# Patient Record
Sex: Male | Born: 1950
Health system: Southern US, Community
[De-identification: ages and names within clinical notes are randomized; demographics above are authoritative.]

## PROBLEM LIST (undated history)

## (undated) DIAGNOSIS — N281 Cyst of kidney, acquired: Secondary | ICD-10-CM

## (undated) DIAGNOSIS — I10 Essential (primary) hypertension: Secondary | ICD-10-CM

## (undated) DIAGNOSIS — E785 Hyperlipidemia, unspecified: Secondary | ICD-10-CM

## (undated) HISTORY — DX: Essential (primary) hypertension: I10

## (undated) HISTORY — DX: Hyperlipidemia, unspecified: E78.5

## (undated) HISTORY — PX: NASAL SINUS SURGERY: SHX719

## (undated) HISTORY — DX: Cyst of kidney, acquired: N28.1

---

## 2005-03-10 ENCOUNTER — Ambulatory Visit (HOSPITAL_COMMUNITY): Admission: RE | Admit: 2005-03-10 | Discharge: 2005-03-10 | Payer: Self-pay | Admitting: Internal Medicine

## 2005-03-10 ENCOUNTER — Ambulatory Visit: Payer: Self-pay | Admitting: Internal Medicine

## 2005-03-10 ENCOUNTER — Encounter (INDEPENDENT_AMBULATORY_CARE_PROVIDER_SITE_OTHER): Payer: Self-pay | Admitting: Internal Medicine

## 2006-05-23 ENCOUNTER — Emergency Department (HOSPITAL_COMMUNITY): Admission: EM | Admit: 2006-05-23 | Discharge: 2006-05-23 | Payer: Self-pay | Admitting: Emergency Medicine

## 2007-05-29 ENCOUNTER — Emergency Department (HOSPITAL_COMMUNITY): Admission: EM | Admit: 2007-05-29 | Discharge: 2007-05-29 | Payer: Self-pay | Admitting: Emergency Medicine

## 2008-08-11 ENCOUNTER — Emergency Department (HOSPITAL_COMMUNITY): Admission: EM | Admit: 2008-08-11 | Discharge: 2008-08-11 | Payer: Self-pay | Admitting: Emergency Medicine

## 2009-03-05 ENCOUNTER — Encounter (INDEPENDENT_AMBULATORY_CARE_PROVIDER_SITE_OTHER): Payer: Self-pay | Admitting: *Deleted

## 2009-03-05 ENCOUNTER — Encounter (INDEPENDENT_AMBULATORY_CARE_PROVIDER_SITE_OTHER): Payer: Self-pay

## 2009-03-05 LAB — CONVERTED CEMR LAB
AST: 17 units/L
Albumin: 4.3 g/dL
Albumin: 4.3 g/dL
BUN: 18 mg/dL
BUN: 18 mg/dL
CO2: 25 meq/L
Calcium: 9.4 mg/dL
Chloride: 103 meq/L
Creatinine, Ser: 1.24 mg/dL
Glomerular Filtration Rate, Af Am: >60 mL/min/{1.73_m2}
Glucose, Bld: 108 mg/dL
Glucose, Bld: 108 mg/dL
Hgb A1c MFr Bld: 7.3 %
LDL Cholesterol: 125 mg/dL
Potassium: 4.2 meq/L
Total Protein: 6.9 g/dL
Triglycerides: 81 mg/dL

## 2009-03-14 ENCOUNTER — Encounter (INDEPENDENT_AMBULATORY_CARE_PROVIDER_SITE_OTHER): Payer: Self-pay

## 2009-03-14 ENCOUNTER — Ambulatory Visit: Payer: Self-pay | Admitting: Adult Health

## 2009-03-14 DIAGNOSIS — E785 Hyperlipidemia, unspecified: Secondary | ICD-10-CM | POA: Insufficient documentation

## 2009-03-14 DIAGNOSIS — E119 Type 2 diabetes mellitus without complications: Secondary | ICD-10-CM

## 2009-03-14 DIAGNOSIS — I10 Essential (primary) hypertension: Secondary | ICD-10-CM | POA: Insufficient documentation

## 2009-03-14 DIAGNOSIS — E669 Obesity, unspecified: Secondary | ICD-10-CM

## 2009-03-29 ENCOUNTER — Encounter: Payer: Self-pay | Admitting: Cardiology

## 2009-03-29 ENCOUNTER — Ambulatory Visit (HOSPITAL_COMMUNITY): Admission: RE | Admit: 2009-03-29 | Discharge: 2009-03-29 | Payer: Self-pay | Admitting: Cardiology

## 2009-03-29 ENCOUNTER — Ambulatory Visit: Payer: Self-pay | Admitting: Cardiology

## 2009-04-02 ENCOUNTER — Encounter: Payer: Self-pay | Admitting: Cardiology

## 2009-04-09 ENCOUNTER — Encounter (INDEPENDENT_AMBULATORY_CARE_PROVIDER_SITE_OTHER): Payer: Self-pay | Admitting: *Deleted

## 2009-04-12 ENCOUNTER — Ambulatory Visit: Payer: Self-pay | Admitting: Cardiology

## 2009-09-10 ENCOUNTER — Encounter (INDEPENDENT_AMBULATORY_CARE_PROVIDER_SITE_OTHER): Payer: Self-pay | Admitting: *Deleted

## 2010-01-29 ENCOUNTER — Encounter (INDEPENDENT_AMBULATORY_CARE_PROVIDER_SITE_OTHER): Payer: Self-pay | Admitting: *Deleted

## 2010-02-13 ENCOUNTER — Emergency Department (HOSPITAL_COMMUNITY): Admission: EM | Admit: 2010-02-13 | Discharge: 2010-02-13 | Payer: Self-pay | Admitting: Emergency Medicine

## 2010-02-19 ENCOUNTER — Emergency Department (HOSPITAL_COMMUNITY)
Admission: EM | Admit: 2010-02-19 | Discharge: 2010-02-19 | Payer: Self-pay | Source: Home / Self Care | Admitting: Emergency Medicine

## 2010-02-19 ENCOUNTER — Emergency Department (HOSPITAL_COMMUNITY): Admission: EM | Admit: 2010-02-19 | Discharge: 2010-02-19 | Payer: Self-pay | Admitting: Emergency Medicine

## 2010-02-27 ENCOUNTER — Ambulatory Visit (HOSPITAL_COMMUNITY): Admission: RE | Admit: 2010-02-27 | Discharge: 2010-02-28 | Payer: Self-pay | Admitting: Otolaryngology

## 2010-04-03 ENCOUNTER — Emergency Department (HOSPITAL_COMMUNITY): Admission: EM | Admit: 2010-04-03 | Discharge: 2010-02-21 | Payer: Self-pay | Admitting: Emergency Medicine

## 2010-05-27 NOTE — Letter (Signed)
Summary: Appointment - Reminder 2  [facility]. [address], [place] [address]   Phone: [phone]  Fax: [phone]     January 29, 2010 MRN: [id number]   [name] [name] [address] [place], [place]  [address]   Dear [name] [name],  Our records indicate that it is time to schedule a follow-up appointment.  KATHRYN LAWRENCE          recommended that you follow up with [place] in   3/11         . It is very important that we reach you to schedule this appointment. We look forward to participating in your health care needs. Please contact us at the number listed above at your earliest convenience to schedule your appointment.  If you are unable to make an appointment at this time, give [place] a call so we can update our records.     Sincerely,   Glass blower/designer

## 2010-05-27 NOTE — Letter (Signed)
Summary: Appointment - Reminder 2  [facility]. [address], [place] [address]   Phone: [phone]  Fax: [phone]     Sep 10, 2009 MRN: [id number]   [name] [name] [address] [place], [place]  [address]   Dear [name],  Our records indicate that it is time to schedule a follow-up appointment.  Joni Reining NP        recommended that you follow up with [place] in   06/2009         . It is very important that we reach you to schedule this appointment. We look forward to participating in your health care needs. Please contact us at the number listed above at your earliest convenience to schedule your appointment.  If you are unable to make an appointment at this time, give [place] a call so we can update our records.     Sincerely,   Glass blower/designer

## 2010-07-08 LAB — CBC
HCT: 41.8 % (ref 39.0–52.0)
Hemoglobin: 13.9 g/dL (ref 13.0–17.0)
MCH: 27.6 pg (ref 26.0–34.0)
MCHC: 33.3 g/dL (ref 30.0–36.0)
MCV: 83.1 fL (ref 78.0–100.0)
Platelets: 221 K/uL (ref 150–400)
RBC: 5.03 MIL/uL (ref 4.22–5.81)
RDW: 13.6 % (ref 11.5–15.5)
WBC: 4.9 10*3/uL (ref 4.0–10.5)

## 2010-07-08 LAB — URINALYSIS, ROUTINE W REFLEX MICROSCOPIC
Bilirubin Urine: NEGATIVE
Glucose, UA: 100 mg/dL — AB
Hgb urine dipstick: NEGATIVE
Ketones, ur: NEGATIVE mg/dL
Nitrite: NEGATIVE
Protein, ur: NEGATIVE mg/dL
Specific Gravity, Urine: 1.006 (ref 1.005–1.030)
Urobilinogen, UA: 0.2 mg/dL (ref 0.0–1.0)
pH: 6 (ref 5.0–8.0)

## 2010-07-08 LAB — GLUCOSE, CAPILLARY
Glucose-Capillary: 140 mg/dL — ABNORMAL HIGH (ref 70–99)
Glucose-Capillary: 149 mg/dL — ABNORMAL HIGH (ref 70–99)
Glucose-Capillary: 166 mg/dL — ABNORMAL HIGH (ref 70–99)
Glucose-Capillary: 168 mg/dL — ABNORMAL HIGH (ref 70–99)
Glucose-Capillary: 169 mg/dL — ABNORMAL HIGH (ref 70–99)
Glucose-Capillary: 270 mg/dL — ABNORMAL HIGH (ref 70–99)

## 2010-07-08 LAB — BASIC METABOLIC PANEL WITH GFR
BUN: 13 mg/dL (ref 6–23)
Chloride: 102 meq/L (ref 96–112)
Creatinine, Ser: 1.2 mg/dL (ref 0.4–1.5)
Glucose, Bld: 145 mg/dL — ABNORMAL HIGH (ref 70–99)
Potassium: 4.3 meq/L (ref 3.5–5.1)

## 2010-07-08 LAB — BASIC METABOLIC PANEL
CO2: 30 mEq/L (ref 19–32)
Calcium: 9.6 mg/dL (ref 8.4–10.5)
GFR calc Af Amer: >60 mL/min (ref >60)
GFR calc non Af Amer: >60 mL/min (ref >60)
Sodium: 138 mEq/L (ref 135–145)

## 2010-07-08 LAB — SURGICAL PCR SCREEN
MRSA, PCR: POSITIVE — AB
Staphylococcus aureus: POSITIVE — AB

## 2010-07-09 LAB — GLUCOSE, CAPILLARY: Glucose-Capillary: 114 mg/dL — ABNORMAL HIGH (ref 70–99)

## 2010-09-12 NOTE — Op Note (Signed)
NAME:  [name] [name]                 ACCOUNT NO.:  [id number]   MEDICAL RECORD NO.:  [id number]          PATIENT TYPE:  AMB   LOCATION:  DAY                           FACILITY:  [facility]   PHYSICIAN:  Lionel December, M.D.    DATE OF BIRTH:  [birth year]   DATE OF PROCEDURE:  03/10/2005  DATE OF DISCHARGE:                                 OPERATIVE REPORT   PROCEDURE:  Colonoscopy with polypectomy.   INDICATIONS:  [name] [name] is a 60 year old African-American male who is here for  screening colonoscopy. Family history is negative for colorectal carcinoma.   Procedure risks were reviewed with the patient, and informed consent was  obtained.   MEDICATIONS USED FOR CONSCIOUS SEDATION:  Demerol 50 mg IV, Versed 6 mg IV  in divided dose.   FINDINGS:  Procedure performed in endoscopy suite. The patient's vital signs  and O2 saturation were monitored during the procedure and remained stable.  The patient was placed in left lateral position, and rectal examination  performed. No abnormality noted on external or digital exam. Olympus  videoscope was placed in rectum and advanced under vision into sigmoid colon  and beyond. Preparation was satisfactory. He had a few tiny diverticula at  sigmoid colon. Scope was advanced to cecum which was identified by ileocecal  valve and appendiceal orifice. There was a tiny polyp at cecum which was  coagulated using snare tip. There was 7 to 8 mm polyp across ileocecal  valve, which this was snared and retrieved for histological examination. As  the scope was withdrawn, colonic mucosa was carefully examined. There was an  18 to 20 mm polyp at mid sigmoid colon. This was on a short stalk. This was  snared and retrieved for histological examination. There was   Dictation ended at this point.      Lionel December, M.D.  Electronically Signed     NR/MEDQ  D:  03/10/2005  T:  03/10/2005  Job:  [id number]

## 2010-12-26 ENCOUNTER — Encounter: Payer: Self-pay | Admitting: Family Medicine

## 2010-12-26 ENCOUNTER — Ambulatory Visit (INDEPENDENT_AMBULATORY_CARE_PROVIDER_SITE_OTHER): Payer: 59 | Admitting: Family Medicine

## 2010-12-26 VITALS — BP 160/90 | HR 87 | Resp 16 | Ht 72.6 in | Wt 294.1 lb

## 2010-12-26 DIAGNOSIS — E785 Hyperlipidemia, unspecified: Secondary | ICD-10-CM

## 2010-12-26 DIAGNOSIS — E119 Type 2 diabetes mellitus without complications: Secondary | ICD-10-CM

## 2010-12-26 DIAGNOSIS — I10 Essential (primary) hypertension: Secondary | ICD-10-CM

## 2010-12-26 DIAGNOSIS — E669 Obesity, unspecified: Secondary | ICD-10-CM

## 2010-12-26 MED ORDER — LISINOPRIL-HYDROCHLOROTHIAZIDE 20-25 MG PO TABS: 1.0000 | ORAL_TABLET | Freq: Every day | ORAL | Status: DC

## 2010-12-26 MED ORDER — GLIPIZIDE ER 10 MG PO TB24: 10.0000 mg | ORAL_TABLET | Freq: Two times a day (BID) | ORAL | Status: DC

## 2010-12-26 NOTE — Patient Instructions (Signed)
Continue your medications Continue blood sugars fasting once a day and recording Please get your labwork done before our next visit F/u in November- 1st week for diabetes/HTN and lab results

## 2010-12-26 NOTE — Progress Notes (Signed)
  Subjective:    Patient ID: [name] [name], male    DOB: 30-Jul-1950, 60 y.o.   MRN: [id number]  HPI Previously seen by Dr, Margo Aye, Here to establish care, medication and history reviewed. He brought in the last set of labs he had   Stress test- 2 years, normal   HTN- BP runs 110-130's/ 70-80's  DM- 15 years-- a1c 7.3 from 2010,  Takes at least once a day fasting  110-150, tolerating current meds, did not tolerate Metformin  Hyperlipidemia- currently not on  Statin , overdue for labs  Colonoscopy- 05/31/2009, 2  small polyp  Does not know if PSA was checked  Works at [employer]      GEN- denies fatigue, fever, weight loss,weakness, recent illness HEENT- denies eye drainage, change in vision, nasal discharge, CVS- denies chest pain, palpitations, leg edema RESP- denies SOB, cough, wheeze ABD- denies N/V, change in stools, abd pain GU- denies dysuria, hematuria, dribbling, incontinence MSK- denies joint pain, muscle aches, injury Neuro- denies headache, dizziness, syncope, seizure activity   Objective:   Physical Exam   GEN- NAD, alert and oriented x3, obese HEENT- PERRL, EOMI, non injected sclera, pink conjunctiva, MMM, oropharynx clear Neck- Supple,  no carotid bruit CVS- RRR, no murmur RESP-CTAB EXT- No edema Pulses- Radial, DP- 2+        Assessment & Plan:

## 2010-12-28 NOTE — Assessment & Plan Note (Signed)
Encouraged weight loss to aide in overall health and co-morbidities

## 2010-12-28 NOTE — Assessment & Plan Note (Signed)
Obtain A1C, obtain records, continue current meds

## 2010-12-28 NOTE — Assessment & Plan Note (Signed)
Continue current meds, he feels he has a component of White coat HTN, most values at home are within normal range

## 2010-12-28 NOTE — Assessment & Plan Note (Signed)
Goal LDL 70, obtain FLP

## 2011-02-21 LAB — LIPID PANEL
Cholesterol: 177 mg/dL (ref 0–200)
LDL Cholesterol: 116 mg/dL — ABNORMAL HIGH (ref 0–99)
Total CHOL/HDL Ratio: 4.7 Ratio
VLDL: 23 mg/dL (ref 0–40)

## 2011-02-21 LAB — COMPREHENSIVE METABOLIC PANEL
AST: 14 U/L (ref 0–37)
Albumin: 4.1 g/dL (ref 3.5–5.2)
Alkaline Phosphatase: 53 U/L (ref 39–117)
BUN: 18 mg/dL (ref 6–23)
Potassium: 4.1 mEq/L (ref 3.5–5.3)
Sodium: 141 mEq/L (ref 135–145)
Total Bilirubin: 0.9 mg/dL (ref 0.3–1.2)

## 2011-02-21 LAB — CBC
Hemoglobin: 14.1 g/dL (ref 13.0–17.0)
MCH: 28.2 pg (ref 26.0–34.0)
MCHC: 32.7 g/dL (ref 30.0–36.0)
MCV: 86.2 fL (ref 78.0–100.0)

## 2011-02-21 LAB — HEMOGLOBIN A1C: Hgb A1c MFr Bld: 7.9 % — ABNORMAL HIGH (ref <5.7)

## 2011-02-23 ENCOUNTER — Telehealth: Payer: Self-pay | Admitting: Family Medicine

## 2011-02-23 NOTE — Telephone Encounter (Signed)
Called [facility] to refill test strips One touch ultra- #100, 6 refills

## 2011-02-25 NOTE — Progress Notes (Signed)
I spoke with pt, he has an appt for Monday

## 2011-03-02 ENCOUNTER — Ambulatory Visit (INDEPENDENT_AMBULATORY_CARE_PROVIDER_SITE_OTHER): Payer: 59 | Admitting: Family Medicine

## 2011-03-02 ENCOUNTER — Encounter: Payer: Self-pay | Admitting: Family Medicine

## 2011-03-02 VITALS — BP 152/80 | HR 88 | Resp 16

## 2011-03-02 DIAGNOSIS — E119 Type 2 diabetes mellitus without complications: Secondary | ICD-10-CM

## 2011-03-02 DIAGNOSIS — I10 Essential (primary) hypertension: Secondary | ICD-10-CM

## 2011-03-02 DIAGNOSIS — E669 Obesity, unspecified: Secondary | ICD-10-CM

## 2011-03-02 DIAGNOSIS — E785 Hyperlipidemia, unspecified: Secondary | ICD-10-CM

## 2011-03-02 MED ORDER — SITAGLIPTIN PHOSPHATE 25 MG PO TABS: 25.0000 mg | ORAL_TABLET | Freq: Every day | ORAL | Status: DC

## 2011-03-02 MED ORDER — ATORVASTATIN CALCIUM 20 MG PO TABS: 20.0000 mg | ORAL_TABLET | Freq: Every day | ORAL | Status: DC

## 2011-03-02 NOTE — Progress Notes (Signed)
  Subjective:    Patient ID: [name] [name], male    DOB: Mar 21, 1951, 60 y.o.   MRN: [id number]  HPI patient here to followup on his labs only.  Diabetes- in the past was on Actos, Januvia, Did not tolerate Metformin. He states his previous doctor put him on multiple medications at one time and he had side effects to them however he did not know which one specifically therefore he stopped most of the medications.   CBG recently - 108-144 fasting   Watching concentrated sweets    HTN- 120-130/80's home BP readings, tolerating HCTZ/lisinopril, review metabolic panel  Hyperlipidemia- reviewed lipid panel. In the past was on simvastatin however he stopped this as his previous medical doctor started multiple medications at one time and he was feeling bad. Therefore he is not currently on any cholesterol-lowering medication.    Review of Systems - none      Objective:   Physical Exam  GEN- NAD, alert and oriented x3       Assessment & Plan:  Approximately 15 minutes spent discussing labs and new medications as well as side effects.

## 2011-03-02 NOTE — Assessment & Plan Note (Signed)
With worsening diabetic control and elevated blood pressure he did discuss the importance of exercise and maintaining an  overall healthy lifestyle.

## 2011-03-02 NOTE — Patient Instructions (Signed)
Please give schedule an appt in 3 months Start the new medication Januvia for your blood sugars  Take your Lipitor at bedtime Call if you have muscle aches or any problems with the medications.

## 2011-03-02 NOTE — Assessment & Plan Note (Signed)
Discussed with patient importance of lowering cholesterol. His goal is less than 100 however I will prefer him to be closer to 70. Will start him on Lipitor 20 mg at bedtime.

## 2011-03-02 NOTE — Assessment & Plan Note (Signed)
Patient A1c has deteriorated from 2010 . His current A1c is 7.8%. He states he is watching his diet but he is overweight. At this time I will add Januvia to his glipizide. He did not tolerate metformin. We will start with a low dose as he is very concerned with adding the medications and side effects. Recheck A1c in 3 months

## 2011-03-02 NOTE — Assessment & Plan Note (Signed)
He needs further reduction in his blood pressure regimen. He is weary to start multiple medications at one time at this time he was started diabetes management cholesterol meds. He will followup in 3 months. His blood pressure is still elevated I will add amlodipine to the regimen.

## 2011-03-04 ENCOUNTER — Telehealth: Payer: Self-pay | Admitting: Family Medicine

## 2011-03-11 NOTE — Telephone Encounter (Signed)
Called number and spoke to patients mother and she said that we were late calling and he went to Dr Joneen Roach. Stated that I could call him at home but he was probably asleep at [phone]

## 2011-04-24 ENCOUNTER — Other Ambulatory Visit: Payer: Self-pay | Admitting: Family Medicine

## 2011-04-28 DIAGNOSIS — N281 Cyst of kidney, acquired: Secondary | ICD-10-CM

## 2011-04-28 HISTORY — DX: Cyst of kidney, acquired: N28.1

## 2011-04-29 ENCOUNTER — Telehealth: Payer: Self-pay | Admitting: Family Medicine

## 2011-04-29 NOTE — Telephone Encounter (Signed)
It has already been sent and verified with the pharmacy

## 2011-05-16 ENCOUNTER — Encounter (HOSPITAL_COMMUNITY): Payer: Self-pay | Admitting: *Deleted

## 2011-05-16 ENCOUNTER — Emergency Department (HOSPITAL_COMMUNITY): Payer: 59

## 2011-05-16 ENCOUNTER — Emergency Department (HOSPITAL_COMMUNITY)
Admission: EM | Admit: 2011-05-16 | Discharge: 2011-05-16 | Disposition: A | Discharge status: Home / Self Care | Payer: 59 | Attending: Emergency Medicine | Admitting: Emergency Medicine

## 2011-05-16 DIAGNOSIS — R319 Hematuria, unspecified: Secondary | ICD-10-CM | POA: Insufficient documentation

## 2011-05-16 DIAGNOSIS — E119 Type 2 diabetes mellitus without complications: Secondary | ICD-10-CM | POA: Insufficient documentation

## 2011-05-16 DIAGNOSIS — Z87891 Personal history of nicotine dependence: Secondary | ICD-10-CM | POA: Insufficient documentation

## 2011-05-16 DIAGNOSIS — R109 Unspecified abdominal pain: Secondary | ICD-10-CM | POA: Insufficient documentation

## 2011-05-16 DIAGNOSIS — R739 Hyperglycemia, unspecified: Secondary | ICD-10-CM

## 2011-05-16 DIAGNOSIS — E785 Hyperlipidemia, unspecified: Secondary | ICD-10-CM | POA: Insufficient documentation

## 2011-05-16 DIAGNOSIS — I10 Essential (primary) hypertension: Secondary | ICD-10-CM | POA: Insufficient documentation

## 2011-05-16 LAB — URINALYSIS, ROUTINE W REFLEX MICROSCOPIC
Bilirubin Urine: NEGATIVE
Nitrite: NEGATIVE
pH: 6 (ref 5.0–8.0)

## 2011-05-16 LAB — CBC
HCT: 40.6 % (ref 39.0–52.0)
Hemoglobin: 13.7 g/dL (ref 13.0–17.0)
WBC: 8.9 10*3/uL (ref 4.0–10.5)

## 2011-05-16 LAB — COMPREHENSIVE METABOLIC PANEL
ALT: 18 U/L (ref 0–53)
Alkaline Phosphatase: 68 U/L (ref 39–117)
CO2: 29 mEq/L (ref 19–32)
GFR calc Af Amer: 73 mL/min — ABNORMAL LOW (ref >90)
GFR calc non Af Amer: 63 mL/min — ABNORMAL LOW (ref >90)
Glucose, Bld: 290 mg/dL — ABNORMAL HIGH (ref 70–99)
Potassium: 3.5 mEq/L (ref 3.5–5.1)
Sodium: 137 mEq/L (ref 135–145)
Total Protein: 7.6 g/dL (ref 6.0–8.3)

## 2011-05-16 LAB — URINE CULTURE
Colony Count: 5000
Culture  Setup Time: 201301191959

## 2011-05-16 MED ORDER — HYDROMORPHONE HCL PF 1 MG/ML IJ SOLN
0.5000 mg | Freq: Once | INTRAMUSCULAR | Status: AC
  Administered 2011-05-16: 0.5 mg via INTRAVENOUS
  Filled 2011-05-16: qty 1

## 2011-05-16 MED ORDER — HYDROMORPHONE HCL PF 1 MG/ML IJ SOLN: 1.0000 mg | Freq: Once | INTRAMUSCULAR | Status: DC

## 2011-05-16 MED ORDER — ONDANSETRON HCL 4 MG/2ML IJ SOLN
4.0000 mg | Freq: Once | INTRAMUSCULAR | Status: AC
  Administered 2011-05-16: 4 mg via INTRAVENOUS
  Filled 2011-05-16: qty 2

## 2011-05-16 MED ORDER — IOHEXOL 300 MG/ML  SOLN
100.0000 mL | Freq: Once | INTRAMUSCULAR | Status: AC | PRN
  Administered 2011-05-16: 100 mL via INTRAVENOUS

## 2011-05-16 MED ORDER — SODIUM CHLORIDE 0.9 % IV BOLUS (SEPSIS)
1000.0000 mL | Freq: Once | INTRAVENOUS | Status: AC
  Administered 2011-05-16: 1000 mL via INTRAVENOUS

## 2011-05-16 MED ORDER — HYDROCODONE-ACETAMINOPHEN 5-325 MG PO TABS: 2.0000 | ORAL_TABLET | ORAL | Status: AC | PRN

## 2011-05-16 NOTE — Discharge Instructions (Signed)
Take Tylenol for pain, and use a heating pad on the sore area of your back, 3 times a day to help with the pain. Call the urologist, for a followup appointment to be seen in 3-4 weeks. He can followup with you about the abnormality seen in the left kidney today. Return here if needed for problems. We are doing a culture to see if you have a urinary tract infection. See your primary care Dr. about your blood sugar, which was slightly high tonight. This should be done in one to 2 weeks.  Abdominal Pain (Nonspecific) Your exam might not show the exact reason you have abdominal pain. Since there are many different causes of abdominal pain, another checkup and more tests may be needed. It is very important to follow up for lasting (persistent) or worsening symptoms. A possible cause of abdominal pain in any person who still has his or her appendix is acute appendicitis. Appendicitis is often hard to diagnose. Normal blood tests, urine tests, ultrasound, and CT scans do not completely rule out early appendicitis or other causes of abdominal pain. Sometimes, only the changes that happen over time will allow appendicitis and other causes of abdominal pain to be determined. Other potential problems that may require surgery may also take time to become more apparent. Because of this, it is important that you follow all of the instructions below. HOME CARE INSTRUCTIONS   Rest as much as possible.   Do not eat solid food until your pain is gone.   While adults or children have pain: A diet of water, weak decaffeinated tea, broth or bouillon, gelatin, oral rehydration solutions (ORS), frozen ice pops, or ice chips may be helpful.   When pain is gone in adults or children: Start a light diet (dry toast, crackers, applesauce, or white rice). Increase the diet slowly as long as it does not bother you. Eat no dairy products (including cheese and eggs) and no spicy, fatty, fried, or high-fiber foods.   Use no alcohol,  caffeine, or cigarettes.   Take your regular medicines unless your caregiver told you not to.   Take any prescribed medicine as directed.   Only take over-the-counter or prescription medicines for pain, discomfort, or fever as directed by your caregiver. Do not give aspirin to children.  If your caregiver has given you a follow-up appointment, it is very important to keep that appointment. Not keeping the appointment could result in a permanent injury and/or lasting (chronic) pain and/or disability. If there is any problem keeping the appointment, you must call to reschedule.  SEEK IMMEDIATE MEDICAL CARE IF:   Your pain is not gone in 24 hours.   Your pain becomes worse, changes location, or feels different.   You or your child has an oral temperature above 102 F (38.9 C), not controlled by medicine.   Your baby is older than 3 months with a rectal temperature of 102 F (38.9 C) or higher.   Your baby is 10 months old or younger with a rectal temperature of 100.4 F (38 C) or higher.   You have shaking chills.   You keep throwing up (vomiting) or cannot drink liquids.   There is blood in your vomit or you see blood in your bowel movements.   Your bowel movements become dark or black.   You have frequent bowel movements.   Your bowel movements stop (become blocked) or you cannot pass gas.   You have bloody, frequent, or painful urination.  You have yellow discoloration in the skin or whites of the eyes.   Your stomach becomes bloated or bigger.   You have dizziness or fainting.   You have chest or back pain.  MAKE SURE YOU:   Understand these instructions.   Will watch your condition.   Will get help right away if you are not doing well or get worse.  Document Released: 04/13/2005 Document Revised: 12/24/2010 Document Reviewed: 03/11/2009 [facility] Patient Information 2012 [place], [place].Diabetes, Frequently Asked Questions WHAT IS DIABETES? Most of the food we  eat is turned into glucose (sugar). Our bodies use it for energy. The pancreas makes a hormone called insulin. It helps glucose get into the cells of our bodies. When you have diabetes, your body either does not make enough insulin or cannot use its own insulin as well as it should. This causes sugars to build up in your blood. WHAT ARE THE SYMPTOMS OF DIABETES?  Frequent urination.   Excessive thirst.   Unexplained weight loss.   Extreme hunger.   Blurred vision.   Tingling or numbness in hands or feet.   Feeling very tired much of the time.   Dry, itchy skin.   Sores that are slow to heal.   Yeast infections.  WHAT ARE THE TYPES OF DIABETES? Type 1 Diabetes   About 10% of affected people have this type.   Usually occurs before the age of 24.   Usually occurs in thin to normal weight people.  Type 2 Diabetes  About 90% of affected people have this type.   Usually occurs after the age of 79.   Usually occurs in overweight people.   More likely to have:   A family history of diabetes.   A history of diabetes during pregnancy (gestational diabetes).   High blood pressure.   High cholesterol and triglycerides.  Gestational Diabetes  Occurs in about 4% of pregnancies.   Usually goes away after the baby is born.   More likely to occur in women with:   Family history of diabetes.   Previous gestational diabetes.   Obese.   Over 66 years old.  WHAT IS PRE-DIABETES? Pre-diabetes means your blood glucose is higher than normal, but lower than the diabetes range. It also means you are at risk of getting type 2 diabetes and heart disease. If you are told you have pre-diabetes, have your blood glucose checked again in 1 to 2 years. WHAT IS THE TREATMENT FOR DIABETES? Treatment is aimed at keeping blood glucose near normal levels at all times. Learning how to manage this yourself is important in treating diabetes. Depending on the type of diabetes you have, your  treatment will include one or more of the following:  Monitoring your blood glucose.   Meal planning.   Exercise.   Oral medicine (pills) or insulin.  CAN DIABETES BE PREVENTED? With type 1 diabetes, prevention is more difficult, because the triggers that cause it are not yet known. With type 2 diabetes, prevention is more likely, with lifestyle changes:  Maintain a healthy weight.   Eat healthy.   Exercise.  IS THERE A CURE FOR DIABETES? No, there is no cure for diabetes. There is a lot of research going on that is looking for a cure, and progress is being made. Diabetes can be treated and controlled. People with diabetes can manage their diabetes and lead normal, active lives. SHOULD I BE TESTED FOR DIABETES? If you are at least 62 years old, you should  be tested for diabetes. You should be tested again every 3 years. If you are 45 or older and overweight, you may want to get tested more often. If you are younger than 45, overweight, and have one or more of the following risk factors, you should be tested:  Family history of diabetes.   Inactive lifestyle.   High blood pressure.  WHAT ARE SOME OTHER SOURCES FOR INFORMATION ON DIABETES? The following organizations may help in your search for more information on diabetes: National Diabetes Education Program (NDEP) Internet: SolarDiscussions.es American Diabetes Association Internet: http://www.diabetes.org  Juvenile Diabetes Foundation International Internet: Wet[employer]Wash.[employer] Document Released: 04/16/2003 Document Revised: 12/24/2010 Document Reviewed: 02/08/2009 [facility] Patient Information 2012 [place], [place].Hematuria Hematuria is the presence of blood in the urine. This condition can result from many problems. Some of these are:   Urinary infections.   Injuries.   Kidney stones.   Drug reactions.   Tumors.   Other diseases.  The treatment depends on the diagnosis. Further studies may be needed  to find the cause even if the hematuria subsides on its own. An exam by an urologist may also be indicated. If you are bleeding heavily, or have prostate problems, clots can form in the bladder and block the passage of urine. This requires emergency treatment with a catheter and bladder irrigation.  Contact your doctor for follow-up care within the next 2-4 days. SEEK IMMEDIATE MEDICAL CARE IF: You develop a fever, abdominal pain, urinary blockage, vomiting, or any other serious problems. Document Released: 05/21/2004 Document Revised: 12/24/2010 Document Reviewed: 02/24/2008 [facility] Patient Information 2012 [place], [place].Pain of Unknown Etiology (Pain Without a Known Cause) You have come to your caregiver because of pain. Pain can occur in any part of the body. Often there is not a definite cause. If your laboratory (blood or urine) work was normal and x-rays or other studies were normal, your caregiver may treat you without knowing the cause of the pain. An example of this is the headache. Most headaches are diagnosed by taking a history. This means your caregiver asks you questions about your headaches. Your caregiver determines a treatment based on your answers. Usually testing done for headaches is normal. Often testing is not done unless there is no response to medications. Regardless of where your pain is located today, you can be given medications to make you comfortable. If no physical cause of pain can be found, most cases of pain will gradually leave as suddenly as they came.  If you have a painful condition and no reason can be found for the pain, It is importantthat you follow up with your caregiver. If the pain becomes worse or does not go away, it may be necessary to repeat tests and look further for a possible cause.  Only take over-the-counter or prescription medicines for pain, discomfort, or fever as directed by your caregiver.   For the protection of your privacy, test results can  not be given over the phone. Make sure you receive the results of your test. Ask as to how these results are to be obtained if you have not been informed. It is your responsibility to obtain your test results.   You may continue all activities unless the activities cause more pain. When the pain lessens, it is important to gradually resume normal activities. Resume activities by beginning slowly and gradually increasing the intensity and duration of the activities or exercise. During periods of severe pain, bed-rest may be helpful. Lay or sit in any position  that is comfortable.   Ice used for acute (sudden) conditions may be effective. Use a large plastic bag filled with ice and wrapped in a towel. This may provide pain relief.   See your caregiver for continued problems. They can help or refer you for exercises or physical therapy if necessary.  If you were given medications for your condition, do not drive, operate machinery or power tools, or sign legal documents for 24 hours. Do not drink alcohol, take sleeping pills, or take other medications that may interfere with treatment. See your caregiver immediately if you have pain that is becoming worse and not relieved by medications. Document Released: 01/06/2001 Document Revised: 12/24/2010 Document Reviewed: 04/13/2005 [facility] Patient Information 2012 [place], [place].

## 2011-05-16 NOTE — ED Notes (Signed)
Pt c/o n/v, left flank pain, and abdominal pain since yesterday.

## 2011-05-16 NOTE — ED Provider Notes (Signed)
History     CSN: [id number]  Arrival date & time 05/16/11  6213   First MD Initiated Contact with Patient 05/16/11 0602      Chief Complaint  Patient presents with  . Emesis  . Flank Pain  . Abdominal Pain    (Consider location/radiation/quality/duration/timing/severity/associated sxs/prior treatment) The history is provided by the patient.  left flank pain started yesterday. Sharp in quality initially not radiating. Pain has since migrated to left lower quadrant of abdomen is still nonradiating. Became severe today with associated nausea and vomiting. Patient denies any hematuria and no history of kidney stones. No fevers. No diarrhea. No recent illness. No history of same. Symptoms initially on and off, but now persistent.  Past Medical History  Diagnosis Date  . Diabetes mellitus   . Hypertension   . Hyperlipidemia     Past Surgical History  Procedure Date  . Nasal sinus surgery     History reviewed. No pertinent family history.  History  Substance Use Topics  . Smoking status: Former Games developer  . Smokeless tobacco: Not on file  . Alcohol Use: No      Review of Systems  Constitutional: Negative for fever and chills.  HENT: Negative for neck pain and neck stiffness.   Eyes: Negative for pain.  Respiratory: Negative for shortness of breath.   Cardiovascular: Negative for chest pain.  Gastrointestinal: Negative for abdominal pain.  Genitourinary: Positive for flank pain. Negative for dysuria and hematuria.  Musculoskeletal: Negative for back pain.  Skin: Negative for rash.  Neurological: Negative for headaches.  All other systems reviewed and are negative.    Allergies  Codeine  Home Medications   Current Outpatient Rx  Name Route Sig Dispense Refill  . ATORVASTATIN CALCIUM 20 MG PO TABS Oral Take 1 tablet (20 mg total) by mouth at bedtime. 30 tablet 6  . CINNAMON 500 MG PO TABS Oral Take 2 tablets by mouth daily.      Marland Kitchen GLIPIZIDE ER 10 MG PO TB24  TAKE  1 TABLET TWICE A DAY 60 tablet 6  . LISINOPRIL-HYDROCHLOROTHIAZIDE 20-25 MG PO TABS Oral Take 1 tablet by mouth daily. 30 tablet 6  . METFORMIN HCL 500 MG PO TABS Oral Take 500 mg by mouth 2 (two) times daily with a meal.    . MULTIVITAMINS PO CAPS Oral Take 1 capsule by mouth daily.      Marland Kitchen SITAGLIPTIN PHOSPHATE 25 MG PO TABS Oral Take 1 tablet (25 mg total) by mouth daily. 30 tablet 3    BP 149/81  Pulse 64  Temp(Src) 97.8 F (36.6 C) (Oral)  Resp 20  Ht 6\' 1"  (1.854 m)  Wt 289 lb (131.09 kg)  BMI 38.13 kg/m2  SpO2 97%  Physical Exam  Constitutional: He is oriented to person, place, and time. He appears well-developed and well-nourished.  HENT:  Head: Normocephalic and atraumatic.  Eyes: Conjunctivae and EOM are normal. Pupils are equal, round, and reactive to light.  Neck: Trachea normal. Neck supple. No thyromegaly present.  Cardiovascular: Normal rate, regular rhythm, S1 normal, S2 normal and normal pulses.     No systolic murmur is present   No diastolic murmur is present  Pulses:      Radial pulses are 2+ on the right side, and 2+ on the left side.  Pulmonary/Chest: Effort normal and breath sounds normal. He has no wheezes. He has no rhonchi. He has no rales. He exhibits no tenderness.  Abdominal: Soft. Normal appearance and bowel sounds  are normal. He exhibits no mass. There is no tenderness. There is no rebound, no guarding, no CVA tenderness and negative Murphy's sign.       Localizes discomfort to left flank left lower quadrant with no reproducible tenderness. No guarding or peritonitis with deep palpation throughout abdomen  Neurological: He is alert and oriented to person, place, and time. He has normal strength. No cranial nerve deficit or sensory deficit. GCS eye subscore is 4. GCS verbal subscore is 5. GCS motor subscore is 6.  Skin: Skin is warm and dry. No rash noted. He is not diaphoretic.  Psychiatric: His speech is normal.       Cooperative and appropriate     ED Course  Procedures (including critical care time)  Results for orders placed during the hospital encounter of 05/16/11  CBC      Component Value Range   WBC 8.9  4.0 - 10.5 (K/uL)   RBC 4.84  4.22 - 5.81 (MIL/uL)   Hemoglobin 13.7  13.0 - 17.0 (g/dL)   HCT 16.1  09.6 - 04.5 (%)   MCV 83.9  78.0 - 100.0 (fL)   MCH 28.3  26.0 - 34.0 (pg)   MCHC 33.7  30.0 - 36.0 (g/dL)   RDW 40.9  81.1 - 91.4 (%)   Platelets 191  150 - 400 (K/uL)     IV Zofran. IV Dilaudid. CT scan to evaluate.CT pending at 0726am.case discussed with radiologist who recommends contrasted CT scan to further evaluate kidney. PT care Tx to EW   MDM   Flank pain. CT scan. Labs and UA.       Sunnie Nielsen, MD 05/16/11 [phone]

## 2011-05-16 NOTE — ED Provider Notes (Signed)
CAT scan was repeated, to evaluate the left kidney abnormality. The diagnosis was nonspecific cystic structure. Urinalysis returned with small amount of red and white blood cells and elevated glucose. Patient is comfortable now in emergency department. His evaluation is consistent with musculoskeletal pain. I doubt that the left kidney cyst is causing his pain. He is stable for discharge with outpatient followup. A urine culture has been done for the possibility of a UTI. He is asked to followup with his primary care Dr. regarding hyperglycemia. He'll be referred to urology to evaluate kidney cyst further. Pain treated with prescription of Vicodin.  Flint Melter, MD 05/16/11 1029

## 2011-05-21 ENCOUNTER — Telehealth: Payer: Self-pay | Admitting: Family Medicine

## 2011-05-21 NOTE — Telephone Encounter (Signed)
I called and left a message on pt phone, he did not answer work phone, will have him call back

## 2011-05-21 NOTE — Telephone Encounter (Signed)
I spoke with wife and she states that the atorvastatin is increasing his blood sugar and it has been in the 190's to 200's fasting. He thinks the [place] may need to be increased or the number of times he takes it per day. Wife wants a call back at [phone]

## 2011-05-21 NOTE — Telephone Encounter (Signed)
I spoke with patient wife. His blood sugars have been around 150s to 200s fasting. He was unsure if it was the Lipitor causing this. His last A1c was 8%. Januvia 25 mg was started. He will increase to Januvia 50 mg. 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 He will calm in 2 weeks with his blood sugars or his wife will call. He will continue Lipitor at this time.

## 2011-06-09 ENCOUNTER — Ambulatory Visit (INDEPENDENT_AMBULATORY_CARE_PROVIDER_SITE_OTHER): Payer: 59 | Admitting: Family Medicine

## 2011-06-09 ENCOUNTER — Encounter: Payer: Self-pay | Admitting: Family Medicine

## 2011-06-09 VITALS — BP 150/90 | HR 77 | Resp 16 | Ht 72.0 in | Wt 300.0 lb

## 2011-06-09 DIAGNOSIS — R319 Hematuria, unspecified: Secondary | ICD-10-CM | POA: Insufficient documentation

## 2011-06-09 DIAGNOSIS — E669 Obesity, unspecified: Secondary | ICD-10-CM

## 2011-06-09 DIAGNOSIS — I10 Essential (primary) hypertension: Secondary | ICD-10-CM

## 2011-06-09 DIAGNOSIS — IMO0002 Reserved for concepts with insufficient information to code with codable children: Secondary | ICD-10-CM

## 2011-06-09 DIAGNOSIS — E785 Hyperlipidemia, unspecified: Secondary | ICD-10-CM

## 2011-06-09 DIAGNOSIS — E119 Type 2 diabetes mellitus without complications: Secondary | ICD-10-CM

## 2011-06-09 LAB — POCT URINALYSIS DIPSTICK
Ketones, UA: NEGATIVE
Protein, UA: NEGATIVE
Spec Grav, UA: 1.02
Urobilinogen, UA: 0.2
pH, UA: 5.5

## 2011-06-09 LAB — HM DIABETES FOOT EXAM: HM Diabetic Foot Exam: NORMAL

## 2011-06-09 LAB — BASIC METABOLIC PANEL
BUN: 19 mg/dL (ref 6–23)
CO2: 27 mEq/L (ref 19–32)
Calcium: 9.4 mg/dL (ref 8.4–10.5)
Creat: 1.21 mg/dL (ref 0.50–1.35)
Glucose, Bld: 174 mg/dL — ABNORMAL HIGH (ref 70–99)

## 2011-06-09 NOTE — Patient Instructions (Signed)
I will call with the new dose of Januvia once I see your labs Continue the lipitor  I will set you up to see the urologist for the blood in the urine and the cyst on your left kidney Work on your diet, low carb, low fat F/u 3 months for diabetes, hypertension

## 2011-06-09 NOTE — Progress Notes (Signed)
  Subjective:    Patient ID: [name] [name], male    DOB: 1950-07-03, 61 y.o.   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  Fasting taken approx 11:00am, meds taken at 3pm and 1 am  HTN- bp at home 117-123/ 77- 85, in our office elevated  Hyperlipidemia- takes lipitor around 1 am  Flank pain- seen in ED in [place] for flank pain, now resolved, UA showed hematuria and CT showed hemorrhagic cyst  Review of Systems  GEN- denies fatigue, fever, weight loss,weakness, recent illness HEENT- denies eye drainage, change in vision, nasal discharge, CVS- denies chest pain, palpitations RESP- denies SOB, cough, wheeze ABD- denies N/V, change in stools, abd pain GU- denies dysuria, hematuria, dribbling, incontinence MSK- denies joint pain, muscle aches, injury Neuro- denies headache, dizziness, syncope, seizure activity       Objective:   Physical Exam GEN- NAD, alert and oriented x3, obese  Repeat BP 148/88 HEENT- PERRL, EOMI, non injected sclera, pink conjunctiva, MMM, oropharynx clear Neck- Supple, no thryomegaly CVS- RRR, no murmur RESP-CTAB EXT- No edema Pulses- Radial, DP- 2+ Diabetic foot exam below       Assessment & Plan:

## 2011-06-10 ENCOUNTER — Encounter: Payer: Self-pay | Admitting: Family Medicine

## 2011-06-10 DIAGNOSIS — IMO0002 Reserved for concepts with insufficient information to code with codable children: Secondary | ICD-10-CM | POA: Insufficient documentation

## 2011-06-10 LAB — HEMOGLOBIN A1C: Mean Plasma Glucose: 212 mg/dL — ABNORMAL HIGH (ref <117)

## 2011-06-10 NOTE — Assessment & Plan Note (Signed)
Continue Lipitor , I do not think this is causing the hyperglycemia

## 2011-06-10 NOTE — Assessment & Plan Note (Signed)
I suspect his diabetes has deteriorated, will obtain A1C then titrate meds

## 2011-06-10 NOTE — Assessment & Plan Note (Signed)
BP not well controlled in office, though pt presents very good readings at home. I will review labs, and then add norvasc

## 2011-06-10 NOTE — Assessment & Plan Note (Signed)
Refer to urology for workup. 

## 2011-06-10 NOTE — Assessment & Plan Note (Addendum)
Hemorrhagic renal cyst noted, will refer to urology secondary to hematuria, may need nephrology, labs to be done

## 2011-06-10 NOTE — Assessment & Plan Note (Signed)
Discussed weight, pt has a very difficult job with shift work therefore his eating habits are very random

## 2011-06-12 ENCOUNTER — Telehealth: Payer: Self-pay | Admitting: Family Medicine

## 2011-06-12 MED ORDER — SITAGLIPTIN PHOS-METFORMIN HCL 50-500 MG PO TABS: 1.0000 | ORAL_TABLET | Freq: Two times a day (BID) | ORAL | Status: DC

## 2011-06-12 NOTE — Telephone Encounter (Signed)
Discussed lab results with patient. He will take [place] 100mg  until he runs out of medication, then start Janumet along with his glipizide

## 2011-07-10 ENCOUNTER — Telehealth: Payer: Self-pay | Admitting: Family Medicine

## 2011-07-10 MED ORDER — DM-GUAIFENESIN ER 30-600 MG PO TB12: 1.0000 | ORAL_TABLET | Freq: Two times a day (BID) | ORAL | Status: AC

## 2011-07-10 NOTE — Telephone Encounter (Signed)
Mucinex DM sent, pt allergy to codiene

## 2011-07-10 NOTE — Telephone Encounter (Signed)
Pt aware.

## 2011-07-17 ENCOUNTER — Other Ambulatory Visit: Payer: Self-pay | Admitting: Family Medicine

## 2011-07-17 MED ORDER — LANCETS MISC: Status: DC

## 2011-07-17 NOTE — Telephone Encounter (Signed)
Lancets sent.

## 2011-08-20 IMAGING — CR DG CHEST 2V
2 series · 2 of 2 positions shown · non-contrast
Comparison: None.

CLINICAL DATA: Preop for surgery for epistaxis and deviated septum

CHEST - 2 VIEW

[view not recorded (1 of 2)]
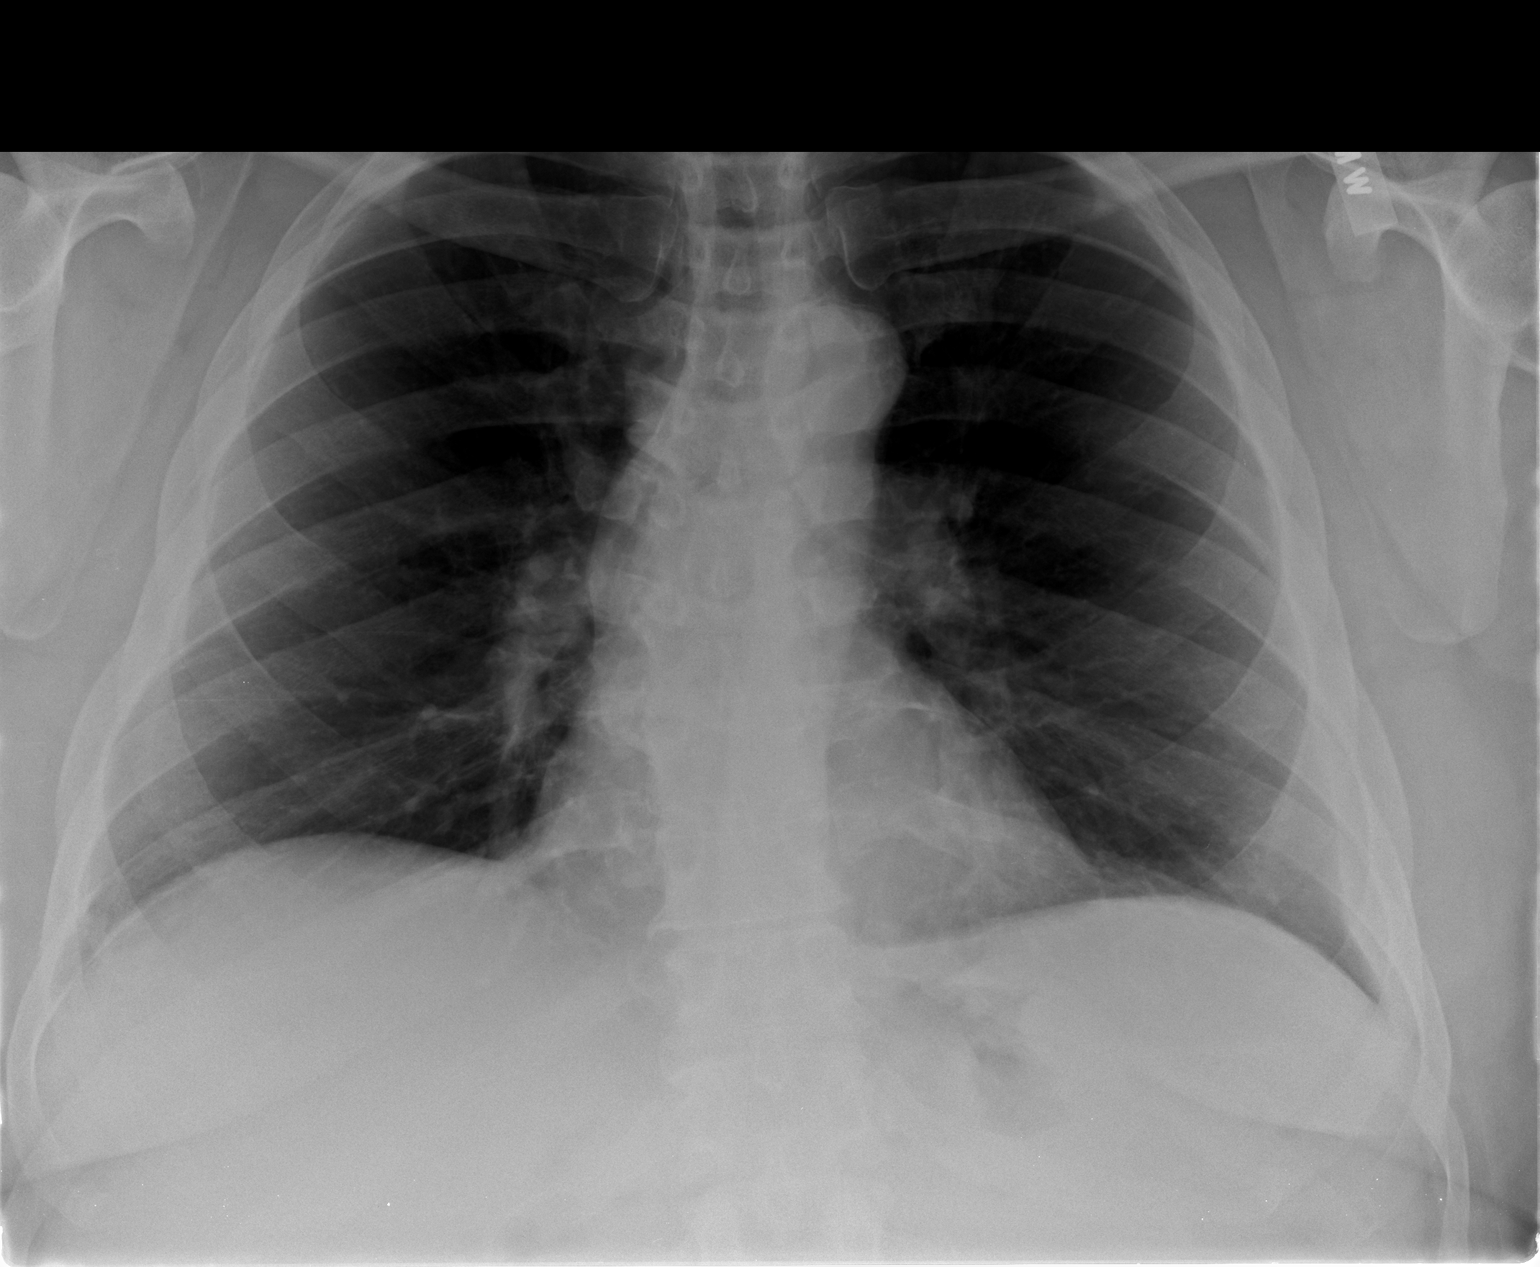

[view not recorded (2 of 2)]
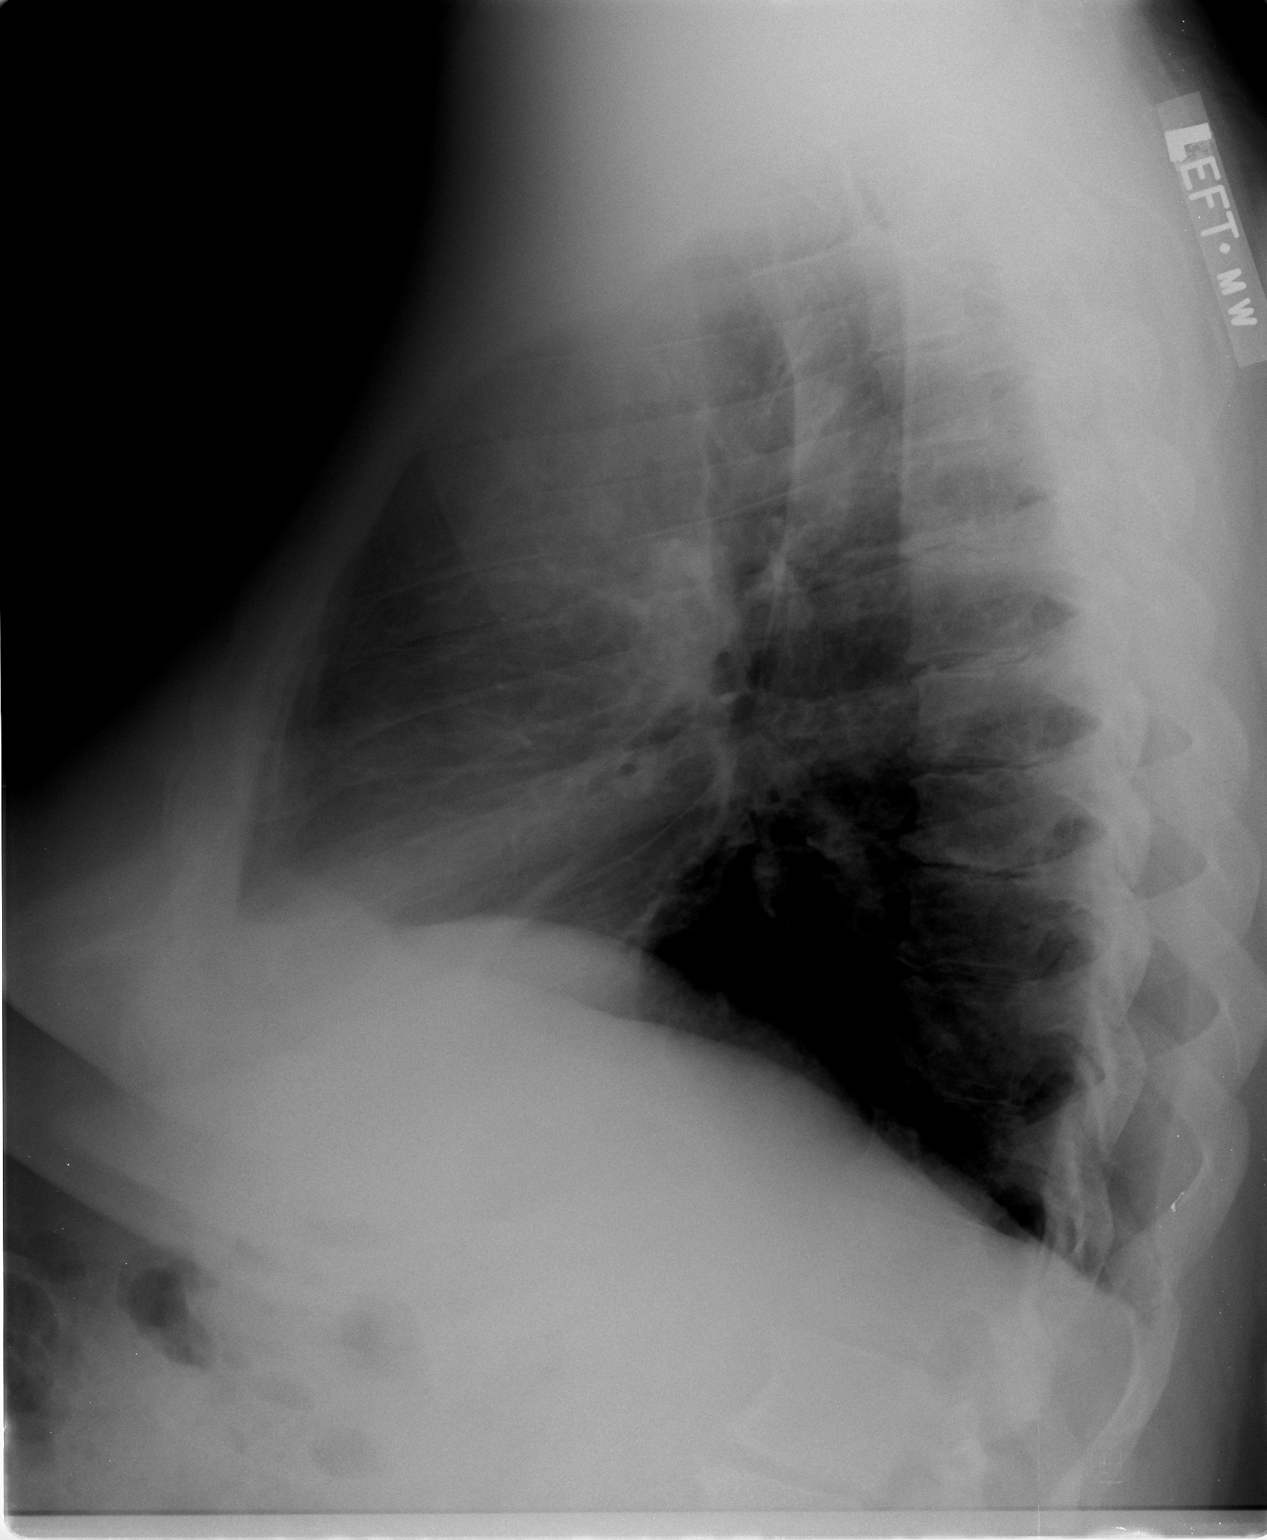

[2 of 2 positions shown; findings below may reference images not displayed]

FINDINGS: The lungs are clear.  Mediastinal contours are normal.
The heart is within normal limits in size.  No bony abnormality is
seen.
IMPRESSION: No active lung disease.

## 2011-09-21 ENCOUNTER — Encounter (HOSPITAL_COMMUNITY): Payer: Self-pay | Admitting: Emergency Medicine

## 2011-09-21 ENCOUNTER — Emergency Department (HOSPITAL_COMMUNITY)
Admission: EM | Admit: 2011-09-21 | Discharge: 2011-09-21 | Disposition: A | Discharge status: Home / Self Care | Payer: 59 | Attending: Emergency Medicine | Admitting: Emergency Medicine

## 2011-09-21 DIAGNOSIS — R040 Epistaxis: Secondary | ICD-10-CM | POA: Insufficient documentation

## 2011-09-21 DIAGNOSIS — I10 Essential (primary) hypertension: Secondary | ICD-10-CM | POA: Insufficient documentation

## 2011-09-21 DIAGNOSIS — Z79899 Other long term (current) drug therapy: Secondary | ICD-10-CM | POA: Insufficient documentation

## 2011-09-21 DIAGNOSIS — E119 Type 2 diabetes mellitus without complications: Secondary | ICD-10-CM | POA: Insufficient documentation

## 2011-09-21 DIAGNOSIS — E785 Hyperlipidemia, unspecified: Secondary | ICD-10-CM | POA: Insufficient documentation

## 2011-09-21 MED ORDER — OXYCODONE-ACETAMINOPHEN 5-325 MG PO TABS: 1.0000 | ORAL_TABLET | Freq: Four times a day (QID) | ORAL | Status: AC | PRN

## 2011-09-21 MED ORDER — ONDANSETRON HCL 4 MG PO TABS
4.0000 mg | ORAL_TABLET | Freq: Once | ORAL | Status: AC
  Administered 2011-09-21: 4 mg via ORAL
  Filled 2011-09-21: qty 1

## 2011-09-21 MED ORDER — OXYCODONE-ACETAMINOPHEN 5-325 MG PO TABS
1.0000 | ORAL_TABLET | Freq: Once | ORAL | Status: AC
  Administered 2011-09-21: 1 via ORAL
  Filled 2011-09-21: qty 1

## 2011-09-21 NOTE — ED Notes (Signed)
Patient reports epistaxis x 1 today. States he woke up with his nose bleeding this morning. Reports light bleeding. Nose packed at this time by patient with cotton PTA.

## 2011-09-21 NOTE — ED Notes (Signed)
PA in with pt at this time  

## 2011-09-21 NOTE — ED Provider Notes (Signed)
History     CSN: [id number]  Arrival date & time 09/21/11  4098   First MD Initiated Contact with Patient 09/21/11 [phone]      Chief Complaint  Patient presents with  . Epistaxis    (Consider location/radiation/quality/duration/timing/severity/associated sxs/prior treatment) Patient is a 61 y.o. male presenting with nosebleeds. The history is provided by the patient.  Epistaxis  This is a recurrent problem. The current episode started 1 to 2 hours ago. The problem occurs hourly. The problem has been gradually worsening. The problem is associated with an unknown factor. The bleeding has been from the left nare. He has tried applying pressure (Packing the nose.) for the symptoms. His past medical history is significant for colds. His past medical history does not include sinus problems. Past medical history comments: Vascular changes in the nose.    Past Medical History  Diagnosis Date  . Diabetes mellitus   . Hypertension   . Hyperlipidemia   . Renal cyst 2013    Left hemorrhagic hilar cyst    Past Surgical History  Procedure Date  . Nasal sinus surgery     No family history on file.  History  Substance Use Topics  . Smoking status: Former Games developer  . Smokeless tobacco: Not on file  . Alcohol Use: No      Review of Systems  Constitutional: Negative for activity change.       All ROS Neg except as noted in HPI  HENT: Positive for nosebleeds. Negative for neck pain.   Eyes: Negative for photophobia and discharge.  Respiratory: Negative for cough, shortness of breath and wheezing.   Cardiovascular: Negative for chest pain and palpitations.  Gastrointestinal: Negative for abdominal pain and blood in stool.  Genitourinary: Negative for dysuria, frequency and hematuria.  Musculoskeletal: Negative for back pain and arthralgias.  Skin: Negative.   Neurological: Negative for dizziness, seizures and speech difficulty.  Psychiatric/Behavioral: Negative for hallucinations and  confusion.    Allergies  Codeine  Home Medications   Current Outpatient Rx  Name Route Sig Dispense Refill  . ATORVASTATIN CALCIUM 20 MG PO TABS Oral Take 1 tablet (20 mg total) by mouth at bedtime. 30 tablet 6  . GLIPIZIDE 10 MG PO TABS Oral Take 10 mg by mouth 2 (two) times daily.    Marland Kitchen LISINOPRIL-HYDROCHLOROTHIAZIDE 20-25 MG PO TABS Oral Take 1 tablet by mouth daily. 30 tablet 6  . MULTIVITAMINS PO CAPS Oral Take 1 capsule by mouth daily.      Marland Kitchen OVER THE COUNTER MEDICATION Oral Take 1 tablet by mouth daily. T-Bomb II supplement: magnesium 15mg , zinc 25mg , copper 2 mg    . OVER THE COUNTER MEDICATION Oral Take 2 capsules by mouth daily. Cinnamon 1000 mg    . SITAGLIPTIN-METFORMIN HCL 50-500 MG PO TABS Oral Take 1 tablet by mouth 2 (two) times daily with a meal. 60 tablet 3    BP 146/83  Pulse 79  Temp(Src) 98 F (36.7 C) (Oral)  Resp 17  Ht 6\' 1"  (1.854 m)  Wt 280 lb (127.007 kg)  BMI 36.94 kg/m2  SpO2 98%  Physical Exam  Nursing note and vitals reviewed. Constitutional: He is oriented to person, place, and time. He appears well-developed and well-nourished.  Non-toxic appearance.  HENT:  Head: Normocephalic.  Right Ear: Tympanic membrane and external ear normal.  Left Ear: Tympanic membrane and external ear normal.       Mild oozing from theleft nostril. Small denuded area of the left septum. No  fb.  Eyes: EOM and lids are normal. Pupils are equal, round, and reactive to light.  Neck: Normal range of motion. Neck supple. Carotid bruit is not present.  Cardiovascular: Normal rate, regular rhythm, normal heart sounds, intact distal pulses and normal pulses.   Pulmonary/Chest: Breath sounds normal. No respiratory distress.  Abdominal: Soft. Bowel sounds are normal. Mass: ild    There is no tenderness. There is no guarding.  Musculoskeletal: Normal range of motion.  Lymphadenopathy:       Head (right side): No submandibular adenopathy present.       Head (left side): No  submandibular adenopathy present.    He has no cervical adenopathy.  Neurological: He is alert and oriented to person, place, and time. He has normal strength. No cranial nerve deficit or sensory deficit.  Skin: Skin is warm and dry.  Psychiatric: He has a normal mood and affect. His speech is normal.    ED Course  Procedures : Nose bleed - Pt ID by arm band. Permission given by the patient. Nose inspected with the nasal canula and otoscope. Small denuded area noted. Rhino rocket device moistened and deployed int the left nostril with good hemastasis.  Labs Reviewed - No data to display No results found.   No diagnosis found.    MDM  I have reviewed nursing notes, vital signs, and all appropriate lab and imaging results for this patient. Pt to have the Rhino device removed May 30. Return sooner if any changes or problem. Rx for percocet for headache.       Kathie Dike, [place] 09/21/11 2219

## 2011-09-21 NOTE — ED Notes (Signed)
PA aware pt ready and setup

## 2011-09-21 NOTE — Discharge Instructions (Signed)
Please return May 30 for the nasal packing to be removed. Return sooner if bleeding not controlled. Tylenol for mild headache. Percocet for more severe headache.Nosebleed Nosebleeds can be caused by many conditions including trauma, infections, polyps, foreign bodies, dry mucous membranes or climate, medications and air conditioning. Most nosebleeds occur in the front of the nose. It is because of this location that most nosebleeds can be controlled by pinching the nostrils gently and continuously. Do this for at least 10 to 20 minutes. The reason for this long continuous pressure is that you must hold it long enough for the blood to clot. If during that 10 to 20 minute time period, pressure is released, the process may have to be started again. The nosebleed may stop by itself, quit with pressure, need concentrated heating (cautery) or stop with pressure from packing. HOME CARE INSTRUCTIONS   If your nose was packed, try to maintain the pack inside until your caregiver removes it. If a gauze pack was used and it starts to fall out, gently replace or cut the end off. Do not cut if a balloon catheter was used to pack the nose. Otherwise, do not remove unless instructed.   Avoid blowing your nose for 12 hours after treatment. This could dislodge the pack or clot and start bleeding again.   If the bleeding starts again, sit up and bending forward, gently pinch the front half of your nose continuously for 20 minutes.   If bleeding was caused by dry mucous membranes, cover the inside of your nose every morning with a petroleum or antibiotic ointment. Use your little fingertip as an applicator. Do this as needed during dry weather. This will keep the mucous membranes moist and allow them to heal.   Maintain humidity in your home by using less air conditioning or using a humidifier.   Do not use aspirin or medications which make bleeding more likely. Your caregiver can give you recommendations on this.    Resume normal activities as able but try to avoid straining, lifting or bending at the waist for several days.   If the nosebleeds become recurrent and the cause is unknown, your caregiver may suggest laboratory tests.  SEEK IMMEDIATE MEDICAL CARE IF:   Bleeding recurs and cannot be controlled.   There is unusual bleeding from or bruising on other parts of the body.   You have a fever.   Nosebleeds continue.   There is any worsening of the condition which originally brought you in.   You become lightheaded, feel faint, become sweaty or vomit blood.  MAKE SURE YOU:   Understand these instructions.   Will watch your condition.   Will get help right away if you are not doing well or get worse.  Document Released: 01/21/2005 Document Revised: 04/02/2011 Document Reviewed: 03/15/2009 [facility] Patient Information 2012 [place], [place].

## 2011-09-22 ENCOUNTER — Other Ambulatory Visit: Payer: Self-pay | Admitting: Family Medicine

## 2011-09-22 NOTE — ED Provider Notes (Signed)
Medical screening examination/treatment/procedure(s) were performed by non-physician practitioner and as supervising physician I was immediately available for consultation/collaboration.   Glynn Octave, MD 09/22/11 [phone]

## 2011-09-24 ENCOUNTER — Emergency Department (HOSPITAL_COMMUNITY)
Admission: EM | Admit: 2011-09-24 | Discharge: 2011-09-24 | Disposition: A | Discharge status: Home / Self Care | Payer: 59 | Attending: Emergency Medicine | Admitting: Emergency Medicine

## 2011-09-24 ENCOUNTER — Encounter (HOSPITAL_COMMUNITY): Payer: Self-pay | Admitting: *Deleted

## 2011-09-24 DIAGNOSIS — E119 Type 2 diabetes mellitus without complications: Secondary | ICD-10-CM | POA: Insufficient documentation

## 2011-09-24 DIAGNOSIS — E785 Hyperlipidemia, unspecified: Secondary | ICD-10-CM | POA: Insufficient documentation

## 2011-09-24 DIAGNOSIS — I10 Essential (primary) hypertension: Secondary | ICD-10-CM | POA: Insufficient documentation

## 2011-09-24 DIAGNOSIS — R040 Epistaxis: Secondary | ICD-10-CM | POA: Insufficient documentation

## 2011-09-24 DIAGNOSIS — Z79899 Other long term (current) drug therapy: Secondary | ICD-10-CM | POA: Insufficient documentation

## 2011-09-24 NOTE — ED Notes (Signed)
Here to have nasal packing removed from left nares which was placed Monday.

## 2011-09-24 NOTE — Discharge Instructions (Signed)
Please see your year nose and throat specialist for followup and recheck. Please return to the emergency department if any changes, problems, or concerns.

## 2011-09-24 NOTE — ED Provider Notes (Signed)
History     CSN: [id number]  Arrival date & time 09/24/11  4098   First MD Initiated Contact with Patient 09/24/11 [phone]      Chief Complaint  Patient presents with  . Wound Check    (Consider location/radiation/quality/duration/timing/severity/associated sxs/prior treatment) Patient is a 61 y.o. male presenting with wound check. The history is provided by the patient.  Wound Check  He was treated in the ED 2 to 3 days ago. Prior ED Treatment: nose bleed. Treatments tried: rhino rocket packing applied. Fever duration: no fever. There is no swelling present. The pain has improved.    Past Medical History  Diagnosis Date  . Diabetes mellitus   . Hypertension   . Hyperlipidemia   . Renal cyst 2013    Left hemorrhagic hilar cyst    Past Surgical History  Procedure Date  . Nasal sinus surgery     No family history on file.  History  Substance Use Topics  . Smoking status: Former Games developer  . Smokeless tobacco: Not on file  . Alcohol Use: No      Review of Systems  Constitutional: Negative for activity change.       All ROS Neg except as noted in HPI  HENT: Positive for nosebleeds. Negative for neck pain.   Eyes: Negative for photophobia and discharge.  Respiratory: Negative for cough, shortness of breath and wheezing.   Cardiovascular: Negative for chest pain and palpitations.  Gastrointestinal: Negative for abdominal pain and blood in stool.  Genitourinary: Negative for dysuria, frequency and hematuria.  Musculoskeletal: Negative for back pain and arthralgias.  Skin: Negative.   Neurological: Negative for dizziness, seizures and speech difficulty.  Psychiatric/Behavioral: Negative for hallucinations and confusion.    Allergies  Codeine  Home Medications   Current Outpatient Rx  Name Route Sig Dispense Refill  . ATORVASTATIN CALCIUM 20 MG PO TABS Oral Take 1 tablet (20 mg total) by mouth at bedtime. 30 tablet 6  . GLIPIZIDE 10 MG PO TABS Oral Take 10 mg by  mouth 2 (two) times daily.    Marland Kitchen LISINOPRIL-HYDROCHLOROTHIAZIDE 20-25 MG PO TABS  TAKE 1 TABLET BY MOUTH DAILY. 30 tablet 6  . MULTIVITAMINS PO CAPS Oral Take 1 capsule by mouth daily.      Marland Kitchen OVER THE COUNTER MEDICATION Oral Take 1 tablet by mouth daily. T-Bomb II supplement: magnesium 15mg , zinc 25mg , copper 2 mg    . OVER THE COUNTER MEDICATION Oral Take 2 capsules by mouth daily. Cinnamon 1000 mg    . OXYCODONE-ACETAMINOPHEN 5-325 MG PO TABS Oral Take 1 tablet by mouth every 6 (six) hours as needed for pain. 20 tablet 0  . SITAGLIPTIN-METFORMIN HCL 50-500 MG PO TABS Oral Take 1 tablet by mouth 2 (two) times daily with a meal. 60 tablet 3    BP 135/82  Pulse 86  Temp(Src) 98.4 F (36.9 C) (Oral)  Resp 16  SpO2 98%  Physical Exam  Nursing note and vitals reviewed. Constitutional: He is oriented to person, place, and time. He appears well-developed and well-nourished.  Non-toxic appearance.  HENT:  Head: Normocephalic.  Right Ear: Tympanic membrane and external ear normal.  Left Ear: Tympanic membrane and external ear normal.       There is no active bleeding noted from either nostril. The rhino rocket is in place.  Eyes: EOM and lids are normal. Pupils are equal, round, and reactive to light.  Neck: Normal range of motion. Neck supple. Carotid bruit is not present.  Cardiovascular: Normal rate, regular rhythm, normal heart sounds, intact distal pulses and normal pulses.   Pulmonary/Chest: Breath sounds normal. No respiratory distress.  Abdominal: Soft. Bowel sounds are normal. There is no tenderness. There is no guarding.  Musculoskeletal: Normal range of motion.  Lymphadenopathy:       Head (right side): No submandibular adenopathy present.       Head (left side): No submandibular adenopathy present.    He has no cervical adenopathy.  Neurological: He is alert and oriented to person, place, and time. He has normal strength. No cranial nerve deficit or sensory deficit.  Skin: Skin  is warm and dry.  Psychiatric: He has a normal mood and affect. His speech is normal.    ED Course  Procedures :Rhino Rocket removed. No bleeding present. The denuded area of the left septal area has healed. No other lesions noted in the nostril. Patient has no problems breathing through the nose.  Labs Reviewed - No data to display No results found.   No diagnosis found.    MDM  **I have reviewed nursing notes, vital signs, and all appropriate lab and imaging results for this patient. No active bleeding present. The nasal Rhino Rocket has been removed. Patient advised to follow up with his ear nose and throat specialist. He's also advised to return to the emergency department if bleeding returns.       Kathie Dike, [place] 09/24/11 [phone]

## 2011-09-25 NOTE — ED Provider Notes (Signed)
Medical screening examination/treatment/procedure(s) were performed by non-physician practitioner and as supervising physician I was immediately available for consultation/collaboration.  Linzi Ohlinger R. Shereece Wellborn, MD 09/25/11 0658 

## 2011-10-11 ENCOUNTER — Other Ambulatory Visit: Payer: Self-pay | Admitting: Family Medicine

## 2011-11-19 ENCOUNTER — Other Ambulatory Visit: Payer: Self-pay | Admitting: Family Medicine

## 2011-11-19 DIAGNOSIS — E119 Type 2 diabetes mellitus without complications: Secondary | ICD-10-CM

## 2011-11-20 ENCOUNTER — Other Ambulatory Visit: Payer: Self-pay | Admitting: Family Medicine

## 2011-11-27 ENCOUNTER — Encounter: Payer: Self-pay | Admitting: Family Medicine

## 2011-11-27 ENCOUNTER — Ambulatory Visit (INDEPENDENT_AMBULATORY_CARE_PROVIDER_SITE_OTHER): Payer: 59 | Admitting: Family Medicine

## 2011-11-27 ENCOUNTER — Other Ambulatory Visit: Payer: Self-pay | Admitting: Family Medicine

## 2011-11-27 VITALS — BP 150/92 | HR 73 | Resp 16 | Ht 72.0 in | Wt 304.0 lb

## 2011-11-27 DIAGNOSIS — E785 Hyperlipidemia, unspecified: Secondary | ICD-10-CM

## 2011-11-27 DIAGNOSIS — I10 Essential (primary) hypertension: Secondary | ICD-10-CM

## 2011-11-27 DIAGNOSIS — E119 Type 2 diabetes mellitus without complications: Secondary | ICD-10-CM

## 2011-11-27 DIAGNOSIS — E669 Obesity, unspecified: Secondary | ICD-10-CM

## 2011-11-27 LAB — COMPREHENSIVE METABOLIC PANEL
Albumin: 4.3 g/dL (ref 3.5–5.2)
Alkaline Phosphatase: 51 U/L (ref 39–117)
BUN: 16 mg/dL (ref 6–23)
CO2: 29 mEq/L (ref 19–32)
Glucose, Bld: 161 mg/dL — ABNORMAL HIGH (ref 70–99)
Total Bilirubin: 0.6 mg/dL (ref 0.3–1.2)
Total Protein: 6.6 g/dL (ref 6.0–8.3)

## 2011-11-27 LAB — CBC
HCT: 40.6 % (ref 39.0–52.0)
Hemoglobin: 13.7 g/dL (ref 13.0–17.0)
MCH: 27.8 pg (ref 26.0–34.0)
MCHC: 33.7 g/dL (ref 30.0–36.0)
RBC: 4.92 MIL/uL (ref 4.22–5.81)

## 2011-11-27 LAB — LIPID PANEL
Cholesterol: 125 mg/dL (ref 0–200)
HDL: 35 mg/dL — ABNORMAL LOW (ref >39)
LDL Cholesterol: 73 mg/dL (ref 0–99)
Triglycerides: 86 mg/dL (ref <150)
VLDL: 17 mg/dL (ref 0–40)

## 2011-11-27 NOTE — Patient Instructions (Signed)
Get the labs done, I will call with results and send you a letter  Continue current medications Keep up with your activity, fresh fruits and veggies, avoid fried foods   F/U 3 months

## 2011-11-27 NOTE — Progress Notes (Signed)
  Subjective:    Patient ID: Mizraim Harmening, male    DOB: [birth year], 61 y.o.   MRN: [id number]  HPI   Pt here for chronic medical care, no concerns. 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   Review of Systems  GEN- denies fatigue, fever, weight loss,weakness, recent illness HEENT- denies eye drainage, change in vision, nasal discharge, CVS- denies chest pain, palpitations RESP- denies SOB, cough, wheeze ABD- denies N/V, change in stools, abd pain GU- denies dysuria, hematuria, dribbling, incontinence MSK- denies joint pain, muscle aches, injury Neuro- denies headache, dizziness, syncope, seizure activity      Objective:   Physical Exam GEN- NAD, alert and oriented x3 HEENT- PERRL, EOMI, non injected sclera, pink conjunctiva, MMM, oropharynx clear Neck- Supple,  CVS- RRR, no murmur RESP-CTAB ABD-NABS,soft,NT,ND  EXT- No edema Pulses- Radial, DP- 2+ Diabetic Foot Exam- nails normal, no callus build-up, normal monofilament       Assessment & Plan:

## 2011-11-29 LAB — HM DIABETES EYE EXAM: HM Diabetic Eye Exam: NORMAL

## 2011-11-29 NOTE — Assessment & Plan Note (Signed)
A1C much improved from 9% to 7.6%, no change in meds, continue with diet changes

## 2011-11-29 NOTE — Assessment & Plan Note (Signed)
I wonder if he has a component of white coat HTN, his home readings which seem to be reliable show very good readings, however in the office he is on the higher side. For now continue ACEI, he is reluctant to add more medication with his home readings being so well controlled

## 2011-11-29 NOTE — Assessment & Plan Note (Signed)
LDL at goal, no change to statin

## 2011-11-29 NOTE — Assessment & Plan Note (Signed)
Unchanged, encouraged increased activity, he is comfortable at his current weight though obese

## 2011-12-14 ENCOUNTER — Other Ambulatory Visit (HOSPITAL_COMMUNITY): Payer: Self-pay | Admitting: Urology

## 2011-12-14 DIAGNOSIS — N281 Cyst of kidney, acquired: Secondary | ICD-10-CM

## 2011-12-17 ENCOUNTER — Ambulatory Visit (HOSPITAL_COMMUNITY)
Admission: RE | Admit: 2011-12-17 | Discharge: 2011-12-17 | Disposition: A | Discharge status: Home / Self Care | Payer: 59 | Source: Ambulatory Visit | Attending: Urology | Admitting: Urology

## 2011-12-17 DIAGNOSIS — R9389 Abnormal findings on diagnostic imaging of other specified body structures: Secondary | ICD-10-CM | POA: Insufficient documentation

## 2011-12-17 DIAGNOSIS — N281 Cyst of kidney, acquired: Secondary | ICD-10-CM | POA: Insufficient documentation

## 2011-12-17 MED ORDER — GADOBENATE DIMEGLUMINE 529 MG/ML IV SOLN
20.0000 mL | Freq: Once | INTRAVENOUS | Status: AC | PRN
  Administered 2011-12-17: 20 mL via INTRAVENOUS

## 2012-02-10 ENCOUNTER — Other Ambulatory Visit: Payer: Self-pay | Admitting: Family Medicine

## 2012-03-10 ENCOUNTER — Other Ambulatory Visit: Payer: Self-pay | Admitting: Family Medicine

## 2012-03-22 ENCOUNTER — Other Ambulatory Visit: Payer: Self-pay | Admitting: Family Medicine

## 2012-03-24 ENCOUNTER — Other Ambulatory Visit: Payer: Self-pay | Admitting: Family Medicine

## 2012-04-16 ENCOUNTER — Other Ambulatory Visit: Payer: Self-pay | Admitting: Family Medicine

## 2012-06-06 ENCOUNTER — Other Ambulatory Visit: Payer: Self-pay | Admitting: Family Medicine

## 2012-06-14 ENCOUNTER — Other Ambulatory Visit: Payer: Self-pay | Admitting: Family Medicine

## 2012-07-16 ENCOUNTER — Other Ambulatory Visit: Payer: Self-pay | Admitting: Family Medicine

## 2012-08-03 ENCOUNTER — Other Ambulatory Visit: Payer: Self-pay | Admitting: Family Medicine

## 2012-08-08 ENCOUNTER — Other Ambulatory Visit: Payer: Self-pay | Admitting: Family Medicine

## 2012-09-05 ENCOUNTER — Other Ambulatory Visit: Payer: Self-pay | Admitting: Family Medicine

## 2012-11-21 ENCOUNTER — Other Ambulatory Visit: Payer: Self-pay | Admitting: Family Medicine

## 2012-11-22 ENCOUNTER — Other Ambulatory Visit: Payer: Self-pay | Admitting: Family Medicine

## 2012-11-29 ENCOUNTER — Encounter: Payer: Self-pay | Admitting: Family Medicine

## 2012-11-29 ENCOUNTER — Telehealth: Payer: Self-pay | Admitting: Family Medicine

## 2012-11-29 MED ORDER — SITAGLIPTIN PHOS-METFORMIN HCL 50-500 MG PO TABS: 1.0000 | ORAL_TABLET | Freq: Two times a day (BID) | ORAL | Status: DC

## 2012-11-29 NOTE — Telephone Encounter (Signed)
Give 30 day supply only, he needs OV before further refills

## 2012-11-29 NOTE — Telephone Encounter (Signed)
Medication refill for one time only.  Patient needs to be seen.  Letter sent for patient to call and schedule 

## 2012-12-05 ENCOUNTER — Ambulatory Visit (INDEPENDENT_AMBULATORY_CARE_PROVIDER_SITE_OTHER): Payer: 59 | Admitting: Family Medicine

## 2012-12-05 ENCOUNTER — Encounter: Payer: Self-pay | Admitting: Family Medicine

## 2012-12-05 VITALS — BP 144/88 | HR 78 | Temp 97.4°F | Resp 17 | Wt 308.0 lb

## 2012-12-05 DIAGNOSIS — E669 Obesity, unspecified: Secondary | ICD-10-CM

## 2012-12-05 DIAGNOSIS — E119 Type 2 diabetes mellitus without complications: Secondary | ICD-10-CM

## 2012-12-05 DIAGNOSIS — E785 Hyperlipidemia, unspecified: Secondary | ICD-10-CM

## 2012-12-05 DIAGNOSIS — I10 Essential (primary) hypertension: Secondary | ICD-10-CM

## 2012-12-05 LAB — HEMOGLOBIN A1C, FINGERSTICK: Hgb A1C (fingerstick): 7.5 % — ABNORMAL HIGH (ref <5.7)

## 2012-12-05 LAB — MICROALBUMIN / CREATININE URINE RATIO
Microalb Creat Ratio: 13.8 mg/g (ref 0.0–30.0)
Microalb, Ur: 3.29 mg/dL — ABNORMAL HIGH (ref 0.00–1.89)

## 2012-12-05 MED ORDER — ONETOUCH ULTRASOFT LANCETS MISC: Status: DC

## 2012-12-05 MED ORDER — GLUCOSE BLOOD VI STRP: ORAL_STRIP | Status: DC

## 2012-12-05 MED ORDER — GLIPIZIDE ER 10 MG PO TB24: ORAL_TABLET | ORAL | Status: DC

## 2012-12-05 MED ORDER — SITAGLIPTIN PHOS-METFORMIN HCL 50-500 MG PO TABS: 1.0000 | ORAL_TABLET | Freq: Two times a day (BID) | ORAL | Status: DC

## 2012-12-05 MED ORDER — ATORVASTATIN CALCIUM 20 MG PO TABS: ORAL_TABLET | ORAL | Status: DC

## 2012-12-05 MED ORDER — LISINOPRIL-HYDROCHLOROTHIAZIDE 20-25 MG PO TABS: ORAL_TABLET | ORAL | Status: DC

## 2012-12-05 NOTE — Assessment & Plan Note (Signed)
Elevated some today, goal 130-140 systolic, pt declines any change in meds RTC in 3 months and reassess

## 2012-12-05 NOTE — Assessment & Plan Note (Signed)
Discussed importance of weight loss. 

## 2012-12-05 NOTE — Assessment & Plan Note (Signed)
On statin recheck FLP, LFT Continue lipitor

## 2012-12-05 NOTE — Patient Instructions (Addendum)
Continue current medications Work on diet and weight loss Get an eye exam  F/U 4 months

## 2012-12-05 NOTE — Progress Notes (Signed)
  Subjective:    Patient ID: [name] [name], male    DOB: 28-Dec-1950, 62 y.o.   MRN: [id number]  HPI Pt here to f/u chronic medical problems, he has been lost to follow-up over the past year. 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 DM- blood sugars have been 74-170's fasting, highest 218 in June, takes CBG fasting daily and records HTN- BP 115-120/70's uses wrist cuff No concerns today, needs meds refilled   Review of Systems - per above  GEN- denies fatigue, fever, weight loss,weakness, recent illness HEENT- denies eye drainage, change in vision, nasal discharge, CVS- denies chest pain, palpitations RESP- denies SOB, cough, wheeze ABD- denies N/V, change in stools, abd pain GU- denies dysuria, hematuria, dribbling, incontinence MSK- denies joint pain, muscle aches, injury Neuro- denies headache, dizziness, syncope, seizure activity ENDO- denies polyuria, polydipsia, polyphagia      Objective:   Physical Exam GEN- NAD, alert and oriented x3, obese, +weight gain HEENT- PERRL, EOMI, non injected sclera, pink conjunctiva, MMM, oropharynx clear Neck- Supple,  CVS- RRR, no murmur RESP-CTAB EXT- No edema Pulses- Radial, DP- 2+        Assessment & Plan:

## 2012-12-05 NOTE — Assessment & Plan Note (Signed)
A1C unchanged at 7.5%, discussed adherence to diet, weight loss , and proper f/u , will not change meds today Pt declines any change On ACE, and ASA Urine micro Check Lytes Needs eye appt, wife to schedule

## 2012-12-10 LAB — LIPID PANEL
LDL Cholesterol: 69 mg/dL (ref 0–99)
Triglycerides: 140 mg/dL (ref <150)

## 2012-12-10 LAB — CBC
HCT: 38.6 % — ABNORMAL LOW (ref 39.0–52.0)
MCHC: 34.2 g/dL (ref 30.0–36.0)
Platelets: 188 10*3/uL (ref 150–400)
RDW: 14.1 % (ref 11.5–15.5)

## 2012-12-10 LAB — COMPREHENSIVE METABOLIC PANEL
ALT: 17 U/L (ref 0–53)
AST: 13 U/L (ref 0–37)
Calcium: 8.9 mg/dL (ref 8.4–10.5)
Chloride: 104 mEq/L (ref 96–112)
Creat: 1.27 mg/dL (ref 0.50–1.35)
Potassium: 3.7 mEq/L (ref 3.5–5.3)
Sodium: 139 mEq/L (ref 135–145)
Total Protein: 6.6 g/dL (ref 6.0–8.3)

## 2012-12-12 ENCOUNTER — Encounter: Payer: Self-pay | Admitting: Family Medicine

## 2012-12-21 ENCOUNTER — Telehealth: Payer: Self-pay | Admitting: Family Medicine

## 2012-12-21 MED ORDER — LISINOPRIL-HYDROCHLOROTHIAZIDE 20-25 MG PO TABS: ORAL_TABLET | ORAL | Status: DC

## 2012-12-21 NOTE — Telephone Encounter (Signed)
Meds refilled.

## 2012-12-21 NOTE — Telephone Encounter (Signed)
Lisinopril-HCTZ 20-25 mg tab 1 QD #30 °

## 2012-12-27 ENCOUNTER — Other Ambulatory Visit: Payer: Self-pay | Admitting: Family Medicine

## 2012-12-28 ENCOUNTER — Telehealth: Payer: Self-pay | Admitting: Family Medicine

## 2012-12-28 MED ORDER — GLIPIZIDE ER 10 MG PO TB24: ORAL_TABLET | ORAL | Status: DC

## 2012-12-28 NOTE — Telephone Encounter (Signed)
Glipizide ER 10 mg tab 1 BID #60

## 2012-12-28 NOTE — Telephone Encounter (Signed)
Meds refilled.

## 2013-04-07 ENCOUNTER — Encounter: Payer: Self-pay | Admitting: Family Medicine

## 2013-04-07 ENCOUNTER — Ambulatory Visit (INDEPENDENT_AMBULATORY_CARE_PROVIDER_SITE_OTHER): Payer: 59 | Admitting: Family Medicine

## 2013-04-07 VITALS — BP 120/82 | HR 102 | Temp 98.1°F | Resp 18 | Ht 70.0 in | Wt 295.0 lb

## 2013-04-07 DIAGNOSIS — E119 Type 2 diabetes mellitus without complications: Secondary | ICD-10-CM

## 2013-04-07 DIAGNOSIS — E669 Obesity, unspecified: Secondary | ICD-10-CM

## 2013-04-07 DIAGNOSIS — L0233 Carbuncle of buttock: Secondary | ICD-10-CM

## 2013-04-07 DIAGNOSIS — L0232 Furuncle of buttock: Secondary | ICD-10-CM

## 2013-04-07 DIAGNOSIS — I10 Essential (primary) hypertension: Secondary | ICD-10-CM

## 2013-04-07 MED ORDER — SULFAMETHOXAZOLE-TMP DS 800-160 MG PO TABS: 1.0000 | ORAL_TABLET | Freq: Two times a day (BID) | ORAL | Status: DC

## 2013-04-07 MED ORDER — HYDROCODONE-ACETAMINOPHEN 5-325 MG PO TABS: 1.0000 | ORAL_TABLET | Freq: Four times a day (QID) | ORAL | Status: DC | PRN

## 2013-04-07 NOTE — Patient Instructions (Signed)
Take antibiotics as prescribed Soak in warm water for the abscess Pain medication Continue current medication F/U MOnday for recheck on abscess

## 2013-04-07 NOTE — Progress Notes (Signed)
   Subjective:    Patient ID: [name] [name], male    DOB: Jan 02, 1951, 62 y.o.   MRN: [id number]  HPI  Pt here to f/u chronic medical problems. He has a boil on his buttocks that has been present for the past week. He states he's had some drainage from the lesion. Is very painful and he is unable to sleep at night.  He had some cough with production for the past few days along with fever. The fever has resolved. He's not had any shortness of breath or chest pain   Diabetes mellitus his last A1c was 7.5% his blood sugars have been less than 200 fasting but worsened since he had the abscess over the past week they have been staying around 200 no hypoglycemia no polyuria polydipsia. Due for pneumonia vaccine   Review of Systems   GEN- denies fatigue, fever, weight loss,weakness, recent illness HEENT- denies eye drainage, change in vision, nasal discharge, CVS- denies chest pain, palpitations RESP- denies SOB, cough, wheeze ABD- denies N/V, change in stools, abd pain GU- denies dysuria, hematuria, dribbling, incontinence MSK- denies joint pain, muscle aches, injury Neuro- denies headache, dizziness, syncope, seizure activity       Objective:   Physical Exam GEN- NAD, alert and oriented x3 HEENT- PERRL, EOMI, non injected sclera, pink conjunctiva, MMM, oropharynx clear Neck- Supple, no LAD CVS- RRR, no murmur RESP-CTAB EXT- No edema Pulses- Radial, DP- 2+ Skin- Right buttocks near gluteal cleft, 3cm area of induration with erythema surrounding, fluctuance in middle -small draining lesion- serosangiounous fluid, no odor, TTP   Procedure- Incision and Drainage Procedure explained to patient questions answered benefits and risks discussed verbal consent obtained. Antiseptic-Betadine Anesthesia-lidocaine w/ epi Incision performed small amount of pus expressed Culture taken Minimal blood loss Patient tolerated procedure well About 1.5 inch of packing placed        Assessment &  Plan:

## 2013-04-08 ENCOUNTER — Encounter: Payer: Self-pay | Admitting: Family Medicine

## 2013-04-08 NOTE — Assessment & Plan Note (Addendum)
A1c obtained at visit. This returned after the patient left he will continue his current meds his A1c  has improved some to 7.3 with his weight loss pneumovaccine will be given on Monday

## 2013-04-08 NOTE — Assessment & Plan Note (Signed)
His blood pressure has improved today. He was also in significant pain from the abscess

## 2013-04-08 NOTE — Assessment & Plan Note (Signed)
Status post incision and drainage with minimal pus production. A wound culture has been sent. He'll be started on Bactrim twice a day he can also do sitz baths. He will return on Monday for me to recheck the area.

## 2013-04-08 NOTE — Assessment & Plan Note (Signed)
He has lost 13 pounds since her last visit continue to encourage weight loss.

## 2013-04-09 LAB — WOUND CULTURE: Gram Stain: NONE SEEN

## 2013-04-10 ENCOUNTER — Ambulatory Visit (INDEPENDENT_AMBULATORY_CARE_PROVIDER_SITE_OTHER): Payer: 59 | Admitting: Family Medicine

## 2013-04-10 ENCOUNTER — Encounter: Payer: Self-pay | Admitting: Family Medicine

## 2013-04-10 VITALS — BP 106/80 | HR 82 | Temp 97.6°F | Resp 18 | Wt 297.0 lb

## 2013-04-10 DIAGNOSIS — L0233 Carbuncle of buttock: Secondary | ICD-10-CM

## 2013-04-10 DIAGNOSIS — L0232 Furuncle of buttock: Secondary | ICD-10-CM

## 2013-04-10 DIAGNOSIS — E119 Type 2 diabetes mellitus without complications: Secondary | ICD-10-CM

## 2013-04-10 MED ORDER — SULFAMETHOXAZOLE-TMP DS 800-160 MG PO TABS: 1.0000 | ORAL_TABLET | Freq: Two times a day (BID) | ORAL | Status: DC

## 2013-04-10 NOTE — Patient Instructions (Signed)
Continue antibiotics- total of 10 days Continue current medications F/U 3 months end of March

## 2013-04-10 NOTE — Assessment & Plan Note (Signed)
Declines pneumovax today A1C down to 7.3, continue current meds and weight loss Recheck in 3 months

## 2013-04-10 NOTE — Assessment & Plan Note (Signed)
The cellulitis and abscess has improved. He does have MRSA. I will extend his antibiotics to a ten-day course. He's not having any systemic symptoms. No further incision and drainage needed as he is improving  Given red flags  If he is unable to work due to to the abscess draining I will give him documentation for work. He was to try to go back tomorrow

## 2013-04-10 NOTE — Progress Notes (Signed)
   Subjective:    Patient ID: [name] [name], male    DOB: 10/28/1950, 62 y.o.   MRN: [id number]  HPI Patient here for recheck on wound. He's also here for results of his A1c. He is history of diabetes mellitus his last A1c was 7.5% it is down to 7.3% he's had some weight loss recently. He's been trying to change his diet. He's taken Chaney meds as well as glipizide on a regular basis.  Gluteal abscess showed MRSA he is currently on Bactrim one tablet twice a day. Of note he never received a prescription for the pain medication. He's not had any fever and states that the pain is much improved. He continues to have some drainage which he wears a pad to help protect his clothing.   Review of Systems   GEN- denies fatigue, fever, weight loss,weakness, recent illness      Objective:   Physical Exam    GEN- NAD, alert and oriented x3, hoarse voice  Skin- Right buttocks near gluteal cleft, 2cm area of induration minimal erythema surrounding,mild serosanguinous drainage from I and D site, minimal tenderness     Assessment & Plan:

## 2013-04-13 ENCOUNTER — Other Ambulatory Visit: Payer: Self-pay | Admitting: Family Medicine

## 2013-05-08 ENCOUNTER — Other Ambulatory Visit: Payer: Self-pay | Admitting: Family Medicine

## 2013-05-19 ENCOUNTER — Ambulatory Visit (INDEPENDENT_AMBULATORY_CARE_PROVIDER_SITE_OTHER): Payer: 59 | Admitting: Family Medicine

## 2013-05-19 ENCOUNTER — Encounter: Payer: Self-pay | Admitting: Family Medicine

## 2013-05-19 VITALS — BP 110/66 | HR 100 | Temp 97.7°F | Resp 18 | Wt 301.0 lb

## 2013-05-19 DIAGNOSIS — L0231 Cutaneous abscess of buttock: Secondary | ICD-10-CM

## 2013-05-19 DIAGNOSIS — L03317 Cellulitis of buttock: Principal | ICD-10-CM

## 2013-05-19 MED ORDER — SULFAMETHOXAZOLE-TMP DS 800-160 MG PO TABS: 1.0000 | ORAL_TABLET | Freq: Two times a day (BID) | ORAL | Status: DC

## 2013-05-19 NOTE — Progress Notes (Signed)
Subjective:    Patient ID: [name] [name], male    DOB: [birth year], 63 y.o.   MRN: [id number]  HPI Patient has a history of a MRSA infection on his buttock.  His history with incision and drainage and Bactrim back in December. It completely resolved. This week he developed a similar tender area on his left, inferior, inner gluteal fold near the rectum. The area was erythematous, warm, tender to the, and indurated. However there is no fluctuance that I can appreciate. There does not appear to be an abscess yet but rather an evolving abscess and definite cellulitis. Past Medical History  Diagnosis Date  . Diabetes mellitus   . Hypertension   . Hyperlipidemia   . Renal cyst 2013    Left hemorrhagic hilar cyst   Current Outpatient Prescriptions on File Prior to Visit  Medication Sig Dispense Refill  . atorvastatin (LIPITOR) 20 MG tablet TAKE 1 TABLET (20 MG TOTAL) BY MOUTH AT BEDTIME.  30 tablet  6  . glipiZIDE (GLUCOTROL XL) 10 MG 24 hr tablet TAKE 1 TABLET TWICE A DAY  60 tablet  5  . glucose blood (ONE TOUCH ULTRA TEST) test strip TEST 1-2 TIMES DAILY  100 each  6  . Lancets (ONETOUCH ULTRASOFT) lancets TEST 1-2 TIMES DAILY  100 each  6  . lisinopril-hydrochlorothiazide (PRINZIDE,ZESTORETIC) 20-25 MG per tablet TAKE 1 TABLET BY MOUTH DAILY.  30 tablet  6  . Multiple Vitamin (MULTIVITAMIN) capsule Take 1 capsule by mouth daily.        Marland Kitchen OVER THE COUNTER MEDICATION Take 1 tablet by mouth daily. T-Bomb II supplement: magnesium 15mg , zinc 25mg , copper 2 mg      . OVER THE COUNTER MEDICATION Take 2 capsules by mouth daily. Cinnamon 1000 mg      . sitaGLIPtan-metformin (JANUMET) 50-500 MG per tablet Take 1 tablet by mouth 2 (two) times daily with a meal.  60 tablet  0   No current facility-administered medications on file prior to visit.   Allergies  Allergen Reactions  . Aspirin     Nose Bleeds   . Codeine Palpitations    'heart beats real fast'   History   Social History  . Marital  Status: Married    Spouse Name: N/A    Number of Children: N/A  . Years of Education: N/A   Occupational History  . Not on file.   Social History Main Topics  . Smoking status: Former Research scientist (life sciences)  . Smokeless tobacco: Not on file  . Alcohol Use: No  . Drug Use: No  . Sexual Activity: Not on file   Other Topics Concern  . Not on file   Social History Narrative  . No narrative on file      Review of Systems  All other systems reviewed and are negative.       Objective:   Physical Exam  Vitals reviewed. Cardiovascular: Normal rate, regular rhythm and normal heart sounds.   Pulmonary/Chest: Effort normal and breath sounds normal. No respiratory distress. He has no wheezes. He has no rales.  Skin: Skin is warm. There is erythema.   centimeter by 2.5 cm erythematous indurated warm area on the inferior, inner, left gluteal fold near the rectum.        Assessment & Plan:  1. Cellulitis and abscess of buttock At the present time, I do not appreciate an abscess which could be drained. I believe this is an evolving abscess and definite cellulitis. I will cover  the patient with Bactrim double strength tablets 1 tablet by mouth twice a day for 14 days. I also recommended that he sit in warm Epsom salts and soak 3 times a day. If he is no better by Monday, I recommended he return for incision and drainage, or seek medical attention immediately if worse. - sulfamethoxazole-trimethoprim (BACTRIM DS) 800-160 MG per tablet; Take 1 tablet by mouth 2 (two) times daily.  Dispense: 28 tablet; Refill: 0

## 2013-05-26 ENCOUNTER — Other Ambulatory Visit: Payer: Self-pay | Admitting: Family Medicine

## 2013-06-06 ENCOUNTER — Other Ambulatory Visit: Payer: Self-pay | Admitting: Family Medicine

## 2013-07-10 ENCOUNTER — Ambulatory Visit (INDEPENDENT_AMBULATORY_CARE_PROVIDER_SITE_OTHER): Payer: 59 | Admitting: Family Medicine

## 2013-07-10 ENCOUNTER — Encounter: Payer: Self-pay | Admitting: Family Medicine

## 2013-07-10 VITALS — BP 138/82 | HR 86 | Temp 98.5°F | Resp 18 | Ht 71.25 in | Wt 300.0 lb

## 2013-07-10 DIAGNOSIS — E669 Obesity, unspecified: Secondary | ICD-10-CM

## 2013-07-10 DIAGNOSIS — E119 Type 2 diabetes mellitus without complications: Secondary | ICD-10-CM

## 2013-07-10 DIAGNOSIS — I10 Essential (primary) hypertension: Secondary | ICD-10-CM

## 2013-07-10 DIAGNOSIS — E785 Hyperlipidemia, unspecified: Secondary | ICD-10-CM

## 2013-07-10 LAB — CBC WITH DIFFERENTIAL/PLATELET
BASOS PCT: 0 % (ref 0–1)
Basophils Absolute: 0 10*3/uL (ref 0.0–0.1)
EOS ABS: 0.2 10*3/uL (ref 0.0–0.7)
EOS PCT: 4 % (ref 0–5)
HCT: 39.7 % (ref 39.0–52.0)
Hemoglobin: 13.4 g/dL (ref 13.0–17.0)
Lymphocytes Relative: 34 % (ref 12–46)
Lymphs Abs: 1.6 10*3/uL (ref 0.7–4.0)
MCH: 28.7 pg (ref 26.0–34.0)
MCHC: 33.8 g/dL (ref 30.0–36.0)
MCV: 85 fL (ref 78.0–100.0)
Monocytes Absolute: 0.4 10*3/uL (ref 0.1–1.0)
Monocytes Relative: 9 % (ref 3–12)
Neutro Abs: 2.4 10*3/uL (ref 1.7–7.7)
Neutrophils Relative %: 53 % (ref 43–77)
Platelets: 232 10*3/uL (ref 150–400)
RBC: 4.67 MIL/uL (ref 4.22–5.81)
RDW: 15.7 % — ABNORMAL HIGH (ref 11.5–15.5)
WBC: 4.6 10*3/uL (ref 4.0–10.5)

## 2013-07-10 LAB — COMPREHENSIVE METABOLIC PANEL
ALT: 26 U/L (ref 0–53)
AST: 19 U/L (ref 0–37)
Albumin: 4.1 g/dL (ref 3.5–5.2)
Alkaline Phosphatase: 45 U/L (ref 39–117)
BILIRUBIN TOTAL: 0.8 mg/dL (ref 0.2–1.2)
BUN: 17 mg/dL (ref 6–23)
CO2: 27 meq/L (ref 19–32)
CREATININE: 1.21 mg/dL (ref 0.50–1.35)
Calcium: 9.4 mg/dL (ref 8.4–10.5)
Chloride: 101 mEq/L (ref 96–112)
Glucose, Bld: 179 mg/dL — ABNORMAL HIGH (ref 70–99)
Potassium: 4 mEq/L (ref 3.5–5.3)
Sodium: 141 mEq/L (ref 135–145)
Total Protein: 7.1 g/dL (ref 6.0–8.3)

## 2013-07-10 LAB — HEMOGLOBIN A1C, FINGERSTICK: Hgb A1C (fingerstick): 7 % — ABNORMAL HIGH (ref <5.7)

## 2013-07-10 LAB — LIPID PANEL
CHOL/HDL RATIO: 4.7 ratio
Cholesterol: 187 mg/dL (ref 0–200)
HDL: 40 mg/dL (ref >39)
LDL Cholesterol: 115 mg/dL — ABNORMAL HIGH (ref 0–99)
Triglycerides: 160 mg/dL — ABNORMAL HIGH (ref <150)
VLDL: 32 mg/dL (ref 0–40)

## 2013-07-10 NOTE — Progress Notes (Signed)
Patient ID: [name] [name], male   DOB: 1950-06-16, 63 y.o.   MRN: [id number]   Subjective:    Patient ID: [name] [name], male    DOB: Sep 17, 1950, 63 y.o.   MRN: [id number]  Patient presents for 3 month F/U  patient here to follow chronic medical problems. He has no specific concerns. Diabetes mellitus blood sugars have been 120-150 fasting. His lowest blood sugars 97 but he was asymptomatic at that range. He is taking his medications as prescribed. He declines Pneumovax today Medications were reviewed. He is due for repeat fasting labs. He is overdue for eye exam He is now retired and has changed his diet start a walking with his wife    Review Of Systems:  GEN- denies fatigue, fever, weight loss,weakness, recent illness HEENT- denies eye drainage, change in vision, nasal discharge, CVS- denies chest pain, palpitations RESP- denies SOB, cough, wheeze ABD- denies N/V, change in stools, abd pain GU- denies dysuria, hematuria, dribbling, incontinence MSK- denies joint pain, muscle aches, injury Neuro- denies headache, dizziness, syncope, seizure activity       Objective:    BP 138/82  Pulse 86  Temp(Src) 98.5 F (36.9 C)  Resp 18  Ht 5' 11.25" (1.81 m)  Wt 300 lb (136.079 kg)  BMI 41.54 kg/m2 GEN- NAD, alert and oriented x3 HEENT- PERRL, EOMI, non injected sclera, pink conjunctiva, MMM, oropharynx clear Neck- Supple,  CVS- RRR, no murmur RESP-CTAB EXT- No edema Pulses- Radial, DP- 2+        Assessment & Plan:      Problem List Items Addressed This Visit   DYSLIPIDEMIA   Relevant Orders      CBC with Differential (Completed)      Comprehensive metabolic panel (Completed)      Lipid panel (Completed)   DM - Primary   Relevant Orders      Hemoglobin A1C, fingerstick (Completed)      Note: This dictation was prepared with Dragon dictation along with smaller phrase technology. Any transcriptional errors that result from this process are unintentional.

## 2013-07-10 NOTE — Assessment & Plan Note (Signed)
Repeat A1C today improved to 7%, continue current meds Declines Pneumovax On ACE, Statin drug

## 2013-07-10 NOTE — Assessment & Plan Note (Signed)
BP looks okay, typically well controlled, no change to meds

## 2013-07-10 NOTE — Patient Instructions (Signed)
I recommend eye visit once a year Goal is to  Exercise 30 minutes 5 days a week We will send a letter with lab results  A1C is 7% F/U 4 months

## 2013-07-10 NOTE — Assessment & Plan Note (Signed)
Discussed importance of diet and exercise weight loss but his diabetes and overall health

## 2013-07-11 ENCOUNTER — Encounter: Payer: Self-pay | Admitting: *Deleted

## 2013-07-12 ENCOUNTER — Other Ambulatory Visit: Payer: Self-pay | Admitting: *Deleted

## 2013-07-12 MED ORDER — ATORVASTATIN CALCIUM 20 MG PO TABS: ORAL_TABLET | ORAL | Status: DC

## 2013-07-12 MED ORDER — LISINOPRIL-HYDROCHLOROTHIAZIDE 20-25 MG PO TABS: ORAL_TABLET | ORAL | Status: DC

## 2013-07-12 MED ORDER — ONETOUCH ULTRASOFT LANCETS MISC: Status: DC

## 2013-07-12 MED ORDER — GLUCOSE BLOOD VI STRP: ORAL_STRIP | Status: DC

## 2013-07-12 MED ORDER — SITAGLIPTIN PHOS-METFORMIN HCL 50-500 MG PO TABS
1.0000 | ORAL_TABLET | Freq: Two times a day (BID) | ORAL | Status: DC
Start: 2013-07-12 — End: 2013-09-01

## 2013-07-12 MED ORDER — GLIPIZIDE ER 10 MG PO TB24: ORAL_TABLET | ORAL | Status: DC

## 2013-07-12 NOTE — Telephone Encounter (Signed)
Refill appropriate and filled per protocol. 

## 2013-09-01 ENCOUNTER — Other Ambulatory Visit: Payer: Self-pay | Admitting: Family Medicine

## 2013-09-29 ENCOUNTER — Other Ambulatory Visit: Payer: Self-pay | Admitting: Family Medicine

## 2013-09-29 DIAGNOSIS — E1165 Type 2 diabetes mellitus with hyperglycemia: Secondary | ICD-10-CM

## 2013-09-29 DIAGNOSIS — E211 Secondary hyperparathyroidism, not elsewhere classified: Secondary | ICD-10-CM

## 2013-09-29 DIAGNOSIS — IMO0001 Reserved for inherently not codable concepts without codable children: Secondary | ICD-10-CM

## 2013-09-29 NOTE — Telephone Encounter (Signed)
Medication refilled per protocol. 

## 2013-10-03 ENCOUNTER — Other Ambulatory Visit: Payer: Self-pay | Admitting: Family Medicine

## 2013-10-04 NOTE — Telephone Encounter (Signed)
Refill appropriate and filled per protocol. 

## 2013-11-10 ENCOUNTER — Encounter: Payer: Self-pay | Admitting: Family Medicine

## 2013-11-10 ENCOUNTER — Ambulatory Visit (INDEPENDENT_AMBULATORY_CARE_PROVIDER_SITE_OTHER): Payer: 59 | Admitting: Family Medicine

## 2013-11-10 VITALS — BP 140/80 | HR 78 | Temp 98.1°F | Resp 14 | Ht 71.0 in | Wt 304.0 lb

## 2013-11-10 DIAGNOSIS — E785 Hyperlipidemia, unspecified: Secondary | ICD-10-CM

## 2013-11-10 DIAGNOSIS — I10 Essential (primary) hypertension: Secondary | ICD-10-CM

## 2013-11-10 DIAGNOSIS — E119 Type 2 diabetes mellitus without complications: Secondary | ICD-10-CM

## 2013-11-10 NOTE — Patient Instructions (Signed)
Get the labs done fasting Continue current medications F/U 4 months- PHYSICAL

## 2013-11-10 NOTE — Assessment & Plan Note (Addendum)
Blood pressure elevated some, no change to meds, stress diet and weight loss

## 2013-11-10 NOTE — Assessment & Plan Note (Signed)
Check lipid panel  

## 2013-11-10 NOTE — Assessment & Plan Note (Signed)
Recheck A1c goal A1c is less than 7% he is on statin drug an ACE inhibitor. He needs to continue to work on his weight he has not lost any weight from her last visit declines pneumonia shot

## 2013-11-10 NOTE — Progress Notes (Signed)
Patient ID: [name] [name], male   DOB: 1950/07/09, 63 y.o.   MRN: [id number]   Subjective:    Patient ID: [name] [name], male    DOB: 1950/11/18, 63 y.o.   MRN: [id number]  Patient presents for 4 month F/U  patient here to follow chronic medical problems. He has no specific concerns today. He tells me that he's been under a lot of stress this week his family members have passed away therefore his blood pressure has been up. He did not bring his meter with him today but states that they have been less than 140 fasting and no hypoglycemia. His last A1c was 7% 4 months ago. He's due for repeat fasting labs.    Review Of Systems:  GEN- denies fatigue, fever, weight loss,weakness, recent illness HEENT- denies eye drainage, change in vision, nasal discharge, CVS- denies chest pain, palpitations RESP- denies SOB, cough, wheeze ABD- denies N/V, change in stools, abd pain GU- denies dysuria, hematuria, dribbling, incontinence MSK- denies joint pain, muscle aches, injury Neuro- denies headache, dizziness, syncope, seizure activity       Objective:    BP 140/80  Pulse 78  Temp(Src) 98.1 F (36.7 C) (Oral)  Resp 14  Ht 5\' 11"  (1.803 m)  Wt 304 lb (137.893 kg)  BMI 42.42 kg/m2 GEN- NAD, alert and oriented x3 HEENT- PERRL, EOMI, non injected sclera, pink conjunctiva, MMM, oropharynx clear Neck- Supple, no thyromegaly CVS- RRR, no murmur RESP-CTAB EXT- No edema Pulses- Radial, DP- 2+        Assessment & Plan:      Problem List Items Addressed This Visit   HYPERTENSION, BENIGN - Primary     Blood pressure is well-controlled    DYSLIPIDEMIA     Check lipid panel    Relevant Orders      Lipid panel   DM     Recheck A1c goal A1c is less than 7% he is on statin drug an ACE inhibitor. He needs to continue to work on his weight he has not lost any weight from her last visit declines pneumonia shot    Relevant Orders      CBC with Differential      Comprehensive metabolic panel   Hemoglobin A1c      Note: This dictation was prepared with Dragon dictation along with smaller phrase technology. Any transcriptional errors that result from this process are unintentional.

## 2013-11-17 ENCOUNTER — Other Ambulatory Visit: Payer: Self-pay | Admitting: Family Medicine

## 2013-11-17 NOTE — Telephone Encounter (Signed)
Refill appropriate and filled per protocol. 

## 2013-11-17 NOTE — Telephone Encounter (Signed)
Dr. Dorian Heckle Patient

## 2013-11-30 LAB — LIPID PANEL
CHOL/HDL RATIO: 4.6 ratio
Cholesterol: 189 mg/dL (ref 0–200)
HDL: 41 mg/dL (ref >39)
LDL CALC: 115 mg/dL — AB (ref 0–99)
Triglycerides: 163 mg/dL — ABNORMAL HIGH (ref <150)
VLDL: 33 mg/dL (ref 0–40)

## 2013-11-30 LAB — COMPREHENSIVE METABOLIC PANEL
ALK PHOS: 46 U/L (ref 39–117)
ALT: 25 U/L (ref 0–53)
AST: 22 U/L (ref 0–37)
Albumin: 4.1 g/dL (ref 3.5–5.2)
BILIRUBIN TOTAL: 0.7 mg/dL (ref 0.2–1.2)
BUN: 17 mg/dL (ref 6–23)
CO2: 27 mEq/L (ref 19–32)
Calcium: 9.2 mg/dL (ref 8.4–10.5)
Chloride: 102 mEq/L (ref 96–112)
Creat: 1.24 mg/dL (ref 0.50–1.35)
GLUCOSE: 174 mg/dL — AB (ref 70–99)
Potassium: 4.1 mEq/L (ref 3.5–5.3)
Sodium: 141 mEq/L (ref 135–145)
Total Protein: 6.9 g/dL (ref 6.0–8.3)

## 2013-11-30 LAB — CBC WITH DIFFERENTIAL/PLATELET
BASOS ABS: 0 10*3/uL (ref 0.0–0.1)
BASOS PCT: 0 % (ref 0–1)
EOS ABS: 0.3 10*3/uL (ref 0.0–0.7)
EOS PCT: 5 % (ref 0–5)
HEMATOCRIT: 39.2 % (ref 39.0–52.0)
Hemoglobin: 13.6 g/dL (ref 13.0–17.0)
Lymphocytes Relative: 44 % (ref 12–46)
Lymphs Abs: 2.5 10*3/uL (ref 0.7–4.0)
MCH: 28.6 pg (ref 26.0–34.0)
MCHC: 34.7 g/dL (ref 30.0–36.0)
MCV: 82.4 fL (ref 78.0–100.0)
MONO ABS: 0.6 10*3/uL (ref 0.1–1.0)
Monocytes Relative: 11 % (ref 3–12)
Neutro Abs: 2.2 10*3/uL (ref 1.7–7.7)
Neutrophils Relative %: 40 % — ABNORMAL LOW (ref 43–77)
Platelets: 200 10*3/uL (ref 150–400)
RBC: 4.76 MIL/uL (ref 4.22–5.81)
RDW: 15.2 % (ref 11.5–15.5)
WBC: 5.6 10*3/uL (ref 4.0–10.5)

## 2013-11-30 LAB — HEMOGLOBIN A1C
HEMOGLOBIN A1C: 7.2 % — AB (ref <5.7)
Mean Plasma Glucose: 160 mg/dL — ABNORMAL HIGH (ref <117)

## 2013-12-02 ENCOUNTER — Other Ambulatory Visit: Payer: Self-pay | Admitting: *Deleted

## 2013-12-02 MED ORDER — ATORVASTATIN CALCIUM 40 MG PO TABS: 40.0000 mg | ORAL_TABLET | Freq: Every day | ORAL | Status: DC

## 2014-02-09 ENCOUNTER — Other Ambulatory Visit: Payer: Self-pay | Admitting: Family Medicine

## 2014-03-13 ENCOUNTER — Encounter: Payer: Self-pay | Admitting: Family Medicine

## 2014-03-13 ENCOUNTER — Ambulatory Visit (INDEPENDENT_AMBULATORY_CARE_PROVIDER_SITE_OTHER): Payer: 59 | Admitting: Family Medicine

## 2014-03-13 VITALS — BP 134/78 | HR 76 | Temp 98.0°F | Resp 14 | Ht 72.0 in | Wt 298.0 lb

## 2014-03-13 DIAGNOSIS — Z0000 Encounter for general adult medical examination without abnormal findings: Secondary | ICD-10-CM

## 2014-03-13 DIAGNOSIS — I10 Essential (primary) hypertension: Secondary | ICD-10-CM

## 2014-03-13 DIAGNOSIS — E785 Hyperlipidemia, unspecified: Secondary | ICD-10-CM

## 2014-03-13 DIAGNOSIS — E669 Obesity, unspecified: Secondary | ICD-10-CM

## 2014-03-13 DIAGNOSIS — Z125 Encounter for screening for malignant neoplasm of prostate: Secondary | ICD-10-CM

## 2014-03-13 DIAGNOSIS — E119 Type 2 diabetes mellitus without complications: Secondary | ICD-10-CM

## 2014-03-13 LAB — CBC WITH DIFFERENTIAL/PLATELET
BASOS ABS: 0 10*3/uL (ref 0.0–0.1)
Basophils Relative: 0 % (ref 0–1)
EOS PCT: 3 % (ref 0–5)
Eosinophils Absolute: 0.2 10*3/uL (ref 0.0–0.7)
HCT: 41.7 % (ref 39.0–52.0)
HEMOGLOBIN: 14.1 g/dL (ref 13.0–17.0)
LYMPHS ABS: 2 10*3/uL (ref 0.7–4.0)
LYMPHS PCT: 34 % (ref 12–46)
MCH: 29.3 pg (ref 26.0–34.0)
MCHC: 33.8 g/dL (ref 30.0–36.0)
MCV: 86.7 fL (ref 78.0–100.0)
MPV: 10.8 fL (ref 9.4–12.4)
Monocytes Absolute: 0.4 10*3/uL (ref 0.1–1.0)
Monocytes Relative: 7 % (ref 3–12)
NEUTROS ABS: 3.2 10*3/uL (ref 1.7–7.7)
NEUTROS PCT: 56 % (ref 43–77)
PLATELETS: 217 10*3/uL (ref 150–400)
RBC: 4.81 MIL/uL (ref 4.22–5.81)
RDW: 14.8 % (ref 11.5–15.5)
WBC: 5.8 10*3/uL (ref 4.0–10.5)

## 2014-03-13 LAB — COMPREHENSIVE METABOLIC PANEL
ALT: 35 U/L (ref 0–53)
AST: 26 U/L (ref 0–37)
Albumin: 4.1 g/dL (ref 3.5–5.2)
Alkaline Phosphatase: 51 U/L (ref 39–117)
BUN: 16 mg/dL (ref 6–23)
CALCIUM: 9.4 mg/dL (ref 8.4–10.5)
CHLORIDE: 103 meq/L (ref 96–112)
CO2: 28 meq/L (ref 19–32)
Creat: 1.34 mg/dL (ref 0.50–1.35)
Glucose, Bld: 174 mg/dL — ABNORMAL HIGH (ref 70–99)
POTASSIUM: 4.2 meq/L (ref 3.5–5.3)
SODIUM: 140 meq/L (ref 135–145)
TOTAL PROTEIN: 7.2 g/dL (ref 6.0–8.3)
Total Bilirubin: 1 mg/dL (ref 0.2–1.2)

## 2014-03-13 LAB — LIPID PANEL
Cholesterol: 157 mg/dL (ref 0–200)
HDL: 43 mg/dL (ref >39)
LDL CALC: 89 mg/dL (ref 0–99)
Total CHOL/HDL Ratio: 3.7 Ratio
Triglycerides: 125 mg/dL (ref <150)
VLDL: 25 mg/dL (ref 0–40)

## 2014-03-13 LAB — MICROALBUMIN / CREATININE URINE RATIO
CREATININE, URINE: 182 mg/dL
MICROALB/CREAT RATIO: 12.6 mg/g (ref 0.0–30.0)
Microalb, Ur: 2.3 mg/dL — ABNORMAL HIGH (ref <2.0)

## 2014-03-13 LAB — HEMOGLOBIN A1C
HEMOGLOBIN A1C: 7.2 % — AB (ref <5.7)
Mean Plasma Glucose: 160 mg/dL — ABNORMAL HIGH (ref <117)

## 2014-03-13 NOTE — Assessment & Plan Note (Signed)
Goal A1C < 7%, he has had some weight loss and dietary changes, no change to meds at this time

## 2014-03-13 NOTE — Progress Notes (Signed)
Patient ID: Donato Studley, male   DOB: 1951/04/07, 63 y.o.   MRN: [id number]   Subjective:    Patient ID: Vinicius Brockman, male    DOB: 1951/01/22, 63 y.o.   MRN: [id number]  Patient presents for CPE patient here for physical exam. His colonoscopy is up-to-date. He declines immunizations. He would like to have prostate cancer screening in the blood. He does complain of some aching that he gets in one spot over his right biceps he states that he is very active but he only feels it at night or on days when he is not very active. He has been using topical muscle cream and this relieves the aching sensation. He has not had any swelling or bruising he does not feel weak in that arm.  DM- CBG 94-140, few elevatred 160's, no hypoglycemia, no polyruia HTN- home BP readings 120-139/74-82 HR 70's    Review Of Systems:  GEN- denies fatigue, fever, weight loss,weakness, recent illness HEENT- denies eye drainage, change in vision, nasal discharge, CVS- denies chest pain, palpitations RESP- denies SOB, cough, wheeze ABD- denies N/V, change in stools, abd pain GU- denies dysuria, hematuria, dribbling, incontinence MSK- denies joint pain, muscle aches, injury Neuro- denies headache, dizziness, syncope, seizure activity       Objective:    BP 134/78 mmHg  Pulse 76  Temp(Src) 98 F (36.7 C) (Oral)  Resp 14  Ht 6' (1.829 m)  Wt 298 lb (135.172 kg)  BMI 40.41 kg/m2 GEN- NAD, alert and oriented x3, weight down 5lbs HEENT- PERRL, EOMI, non injected sclera, pink conjunctiva, MMM, oropharynx clear TM clear bilat Neck- Supple, no LAD CVS- RRR, no murmur RESP-CTAB ABD-NABS,soft,NT,ND Rectal- Declined EXT- No edema Pulses- Radial, DP- 2+        Assessment & Plan:      Problem List Items Addressed This Visit    Obesity   HYPERTENSION, BENIGN   Hyperlipidemia   Relevant Orders      Lipid panel   Diabetes mellitus, type II   Relevant Orders      CBC with Differential      Comprehensive  metabolic panel      Hemoglobin A1c      Microalbumin / creatinine urine ratio      [facility] DIABETES FOOT EXAM (Completed)    Other Visit Diagnoses    Routine general medical examination at a health care facility    -  Primary    Declines immunizations, otherwise preventative care UTD    Prostate cancer screening        Relevant Orders       PSA       Note: This dictation was prepared with Dragon dictation along with smaller phrase technology. Any transcriptional errors that result from this process are unintentional.

## 2014-03-13 NOTE — Patient Instructions (Signed)
Continue current medications We will call with labs  COntinue working on weight loss F/U 4 months

## 2014-03-13 NOTE — Assessment & Plan Note (Signed)
Well controlled, no change to meds 

## 2014-03-13 NOTE — Assessment & Plan Note (Signed)
Continue to work on dietary changes, he is now below the 300lb mark

## 2014-03-14 LAB — PSA: PSA: 0.64 ng/mL (ref <=4.00)

## 2014-03-15 ENCOUNTER — Other Ambulatory Visit: Payer: Self-pay | Admitting: Family Medicine

## 2014-03-26 ENCOUNTER — Other Ambulatory Visit: Payer: Self-pay | Admitting: Family Medicine

## 2014-03-27 NOTE — Telephone Encounter (Signed)
Refill appropriate and filled per protocol. 

## 2014-05-24 ENCOUNTER — Encounter (INDEPENDENT_AMBULATORY_CARE_PROVIDER_SITE_OTHER): Payer: Self-pay | Admitting: *Deleted

## 2014-05-24 ENCOUNTER — Encounter (INDEPENDENT_AMBULATORY_CARE_PROVIDER_SITE_OTHER): Payer: Self-pay

## 2014-06-06 ENCOUNTER — Telehealth (INDEPENDENT_AMBULATORY_CARE_PROVIDER_SITE_OTHER): Payer: Self-pay | Admitting: *Deleted

## 2014-06-06 ENCOUNTER — Other Ambulatory Visit (INDEPENDENT_AMBULATORY_CARE_PROVIDER_SITE_OTHER): Payer: Self-pay | Admitting: *Deleted

## 2014-06-06 DIAGNOSIS — Z1211 Encounter for screening for malignant neoplasm of colon: Secondary | ICD-10-CM

## 2014-06-06 DIAGNOSIS — Z86010 Personal history of colonic polyps: Secondary | ICD-10-CM

## 2014-06-06 NOTE — Telephone Encounter (Signed)
Patient needs movi prep 

## 2014-06-06 NOTE — Telephone Encounter (Signed)
Referring MD/PCP: [place]   Procedure: tcs  Reason/Indication:  Hx polyps  Has patient had this procedure before?  Yes, 2011 -- scanned  If so, when, by whom and where?    Is there a family history of colon cancer?  no  Who?  What age when diagnosed?    Is patient diabetic?   yes      Does patient have prosthetic heart valve?  no  Do you have a pacemaker?  no  Has patient ever had endocarditis? no  Has patient had joint replacement within last 12 months?  no  Does patient tend to be constipated or take laxatives? no  Is patient on Coumadin, Plavix and/or Aspirin? no  Medications: lisinopril/hctz 20/25 mg daily, janumet 50/500 mg bid, glipizide 10 mg bid  Allergies: codeine  Medication Adjustment: hold Janumet & Glipizide evening before and morning of  Procedure date & time: 06/28/14 at 1030

## 2014-06-13 MED ORDER — PEG-KCL-NACL-NASULF-NA ASC-C 100 G PO SOLR: 1.0000 | Freq: Once | ORAL | Status: DC

## 2014-06-13 NOTE — Telephone Encounter (Signed)
agree

## 2014-06-28 ENCOUNTER — Ambulatory Visit (HOSPITAL_COMMUNITY)
Admission: RE | Admit: 2014-06-28 | Discharge: 2014-06-28 | Disposition: A | Discharge status: Home / Self Care | Payer: 59 | Source: Ambulatory Visit | Attending: Internal Medicine | Admitting: Internal Medicine

## 2014-06-28 ENCOUNTER — Encounter (HOSPITAL_COMMUNITY)
Admission: RE | Disposition: A | Discharge status: Home / Self Care | Payer: Self-pay | Source: Ambulatory Visit | Attending: Internal Medicine

## 2014-06-28 ENCOUNTER — Encounter (HOSPITAL_COMMUNITY): Payer: Self-pay | Admitting: *Deleted

## 2014-06-28 DIAGNOSIS — Z86010 Personal history of colonic polyps: Secondary | ICD-10-CM

## 2014-06-28 DIAGNOSIS — Z09 Encounter for follow-up examination after completed treatment for conditions other than malignant neoplasm: Secondary | ICD-10-CM | POA: Diagnosis present

## 2014-06-28 DIAGNOSIS — Z87891 Personal history of nicotine dependence: Secondary | ICD-10-CM | POA: Insufficient documentation

## 2014-06-28 DIAGNOSIS — E119 Type 2 diabetes mellitus without complications: Secondary | ICD-10-CM | POA: Diagnosis not present

## 2014-06-28 DIAGNOSIS — K5730 Diverticulosis of large intestine without perforation or abscess without bleeding: Secondary | ICD-10-CM | POA: Insufficient documentation

## 2014-06-28 DIAGNOSIS — Z79899 Other long term (current) drug therapy: Secondary | ICD-10-CM | POA: Diagnosis not present

## 2014-06-28 DIAGNOSIS — E785 Hyperlipidemia, unspecified: Secondary | ICD-10-CM | POA: Insufficient documentation

## 2014-06-28 DIAGNOSIS — I10 Essential (primary) hypertension: Secondary | ICD-10-CM | POA: Diagnosis not present

## 2014-06-28 DIAGNOSIS — D123 Benign neoplasm of transverse colon: Secondary | ICD-10-CM | POA: Insufficient documentation

## 2014-06-28 HISTORY — PX: COLONOSCOPY: SHX5424

## 2014-06-28 LAB — GLUCOSE, CAPILLARY: GLUCOSE-CAPILLARY: 135 mg/dL — AB (ref 70–99)

## 2014-06-28 SURGERY — COLONOSCOPY
Anesthesia: Moderate Sedation

## 2014-06-28 MED ORDER — MIDAZOLAM HCL 5 MG/5ML IJ SOLN
INTRAMUSCULAR | Status: DC | PRN
  Administered 2014-06-28 (×3): 2 mg via INTRAVENOUS

## 2014-06-28 MED ORDER — MEPERIDINE HCL 50 MG/ML IJ SOLN
INTRAMUSCULAR | Status: AC
  Filled 2014-06-28: qty 1

## 2014-06-28 MED ORDER — STERILE WATER FOR IRRIGATION IR SOLN
Status: DC | PRN
  Administered 2014-06-28: 10:00:00

## 2014-06-28 MED ORDER — SODIUM CHLORIDE 0.9 % IV SOLN
INTRAVENOUS | Status: DC
  Administered 2014-06-28: 10:00:00 via INTRAVENOUS

## 2014-06-28 MED ORDER — MEPERIDINE HCL 50 MG/ML IJ SOLN
INTRAMUSCULAR | Status: DC | PRN
  Administered 2014-06-28 (×2): 25 mg via INTRAVENOUS

## 2014-06-28 MED ORDER — MIDAZOLAM HCL 5 MG/5ML IJ SOLN
INTRAMUSCULAR | Status: AC
  Filled 2014-06-28: qty 10

## 2014-06-28 NOTE — Op Note (Signed)
COLONOSCOPY PROCEDURE REPORT  PATIENT:  [name] [name]  MR#:  [id number] Birthdate:  Mar 04, 1951, 64 y.o., male Endoscopist:  Dr. Rogene Houston, MD Referred By:  Dr. Vic Blackbird, MD Procedure Date: 06/28/2014  Procedure:   Colonoscopy  Indications: Patient is 64 year old African-American male with history of colonic adenomas and is here for surveillance colonoscopy. Family history is negative for CRC.  Informed Consent:  The procedure and risks were reviewed with the patient and informed consent was obtained.  Medications:  Demerol 50 mg IV Versed 6 mg IV  Description of procedure:  After a digital rectal exam was performed, that colonoscope was advanced from the anus through the rectum and colon to the area of the cecum, ileocecal valve and appendiceal orifice. The cecum was deeply intubated. These structures were well-seen and photographed for the record. From the level of the cecum and ileocecal valve, the scope was slowly and cautiously withdrawn. The mucosal surfaces were carefully surveyed utilizing scope tip to flexion to facilitate fold flattening as needed. The scope was pulled down into the rectum where a thorough exam including retroflexion was performed.  Findings:   Prep excellent. Single small polyp ablated via cold biopsy from splenic flexure. Few scattered diverticula at sigmoid colon. Normal rectal mucosa and anal rectal junction.   Therapeutic/Diagnostic Maneuvers Performed:  See above   Complications:  None   Cecal Withdrawal Time:  15 minutes  Impression:  Examination performed to cecum. Single small polyp ablated by cold biopsy from splenic flexure. Mild sigmoid colon diverticulosis.  Recommendations:  Standard instructions given. I will contact patient with biopsy results and further recommendations.  Kasyn Rolph U  06/28/2014 10:53 AM  CC: Dr. Vic Blackbird, MD & Dr. Rayne Du ref. provider found

## 2014-06-28 NOTE — H&P (Signed)
[name] [name] is an 64 y.o. male.   Chief Complaint: Patient is here for colonoscopy. HPI: Patient is 64 year old African-American male was history of colonic adenomas and is for surveillance colonoscopy. His first colonoscopy was 10 years ago with removal of 2 polyps are tubular adenomas. Last colonoscopy was in for every 2011 with removal of 2 polyps with there were non-adenomatous. Patient denies abdominal pain change in bowel habits or rectal bleeding. Family history is negative for CRC.   Past Medical History  Diagnosis Date  . Diabetes mellitus   . Hypertension   . Hyperlipidemia   . Renal cyst 2013    Left hemorrhagic hilar cyst    Past Surgical History  Procedure Laterality Date  . Nasal sinus surgery      History reviewed. No pertinent family history. Social History:  reports that he has quit smoking. He has never used smokeless tobacco. He reports that he does not drink alcohol or use illicit drugs.  Allergies:  Allergies  Allergen Reactions  . Aspirin     Nose Bleeds   . Codeine Palpitations    'heart beats real fast'    Medications Prior to Admission  Medication Sig Dispense Refill  . atorvastatin (LIPITOR) 40 MG tablet Take 1 tablet (40 mg total) by mouth daily at 6 PM. 30 tablet 6  . glipiZIDE (GLUCOTROL XL) 10 MG 24 hr tablet TAKE 1 TABLET TWICE A DAY 60 tablet 6  . JANUMET 50-500 MG per tablet TAKE 1 TABLET TWICE A DAY WITH MEALS 60 tablet 3  . Multiple Vitamin (MULTIVITAMIN) capsule Take 1 capsule by mouth daily.      Marland Kitchen OVER THE COUNTER MEDICATION Take 1 tablet by mouth daily. T-Bomb II supplement: magnesium 58m, zinc 268m copper 2 mg    . peg 3350 powder (MOVIPREP) 100 G SOLR Take 1 kit (200 g total) by mouth once. 1 kit 0  . lisinopril-hydrochlorothiazide (PRINZIDE,ZESTORETIC) 20-25 MG per tablet TAKE 1 TABLET BY MOUTH DAILY. (Patient not taking: Reported on 06/18/2014) 30 tablet 6    Results for orders placed or performed during the hospital  encounter of 06/28/14 (from the past 48 hour(s))  Glucose, capillary     Status: Abnormal   Collection Time: 06/28/14 10:05 AM  Result Value Ref Range   Glucose-Capillary 135 (H) 70 - 99 mg/dL   Comment 1 Document in Chart    No results found.  ROS  Blood pressure 134/86, pulse 88, temperature 97.8 F (36.6 C), temperature source Oral, resp. rate 16, height 6' (1.829 m), weight 298 lb (135.172 kg), SpO2 98 %. Physical Exam  Constitutional: He appears well-developed and well-nourished.  HENT:  Mouth/Throat: Oropharynx is clear and moist.  Eyes: Conjunctivae are normal.  Neck: No tracheal deviation present. No thyromegaly present.  Cardiovascular: Normal rate, regular rhythm and normal heart sounds.   No murmur heard. Respiratory: Effort normal.  GI:  Protuberant abdomen. It is soft and nontender without organomegaly or masses.  Musculoskeletal: He exhibits no edema.  Lymphadenopathy:    He has no cervical adenopathy.  Neurological: He is alert.  Skin: Skin is warm and dry.     Assessment/Plan History of colonic adenomas. Surveillance colonoscopy.  Krysta Bloomfield U 06/28/2014, 10:11 AM

## 2014-06-28 NOTE — Discharge Instructions (Signed)
Resume usual medications and high fiber diet. °No driving for 24 hours. °Physician will call with biopsy results ° ° °High-Fiber Diet °Fiber is found in fruits, vegetables, and grains. A high-fiber diet encourages the addition of more whole grains, legumes, fruits, and vegetables in your diet. The recommended amount of fiber for adult males is 38 g per day. For adult females, it is 25 g per day. Pregnant and lactating women should get 28 g of fiber per day. If you have a digestive or bowel problem, ask your caregiver for advice before adding high-fiber foods to your diet. Eat a variety of high-fiber foods instead of only a select few type of foods.  °PURPOSE °· To increase stool bulk. °· To make bowel movements more regular to prevent constipation. °· To lower cholesterol. °· To prevent overeating. °WHEN IS THIS DIET USED? °· It may be used if you have constipation and hemorrhoids. °· It may be used if you have uncomplicated diverticulosis (intestine condition) and irritable bowel syndrome. °· It may be used if you need help with weight management. °· It may be used if you want to add it to your diet as a protective measure against atherosclerosis, diabetes, and cancer. °SOURCES OF FIBER °· Whole-grain breads and cereals. °· Fruits, such as apples, oranges, bananas, berries, prunes, and pears. °· Vegetables, such as green peas, carrots, sweet potatoes, beets, broccoli, cabbage, spinach, and artichokes. °· Legumes, such split peas, soy, lentils. °· Almonds. °FIBER CONTENT IN FOODS °Starches and Grains / Dietary Fiber (g) °· Cheerios, 1 cup / 3 g °· Corn Flakes cereal, 1 cup / 0.7 g °· Rice crispy treat cereal, 1¼ cup / 0.3 g °· Instant oatmeal (cooked), ½ cup / 2 g °· Frosted wheat cereal, 1 cup / 5.1 g °· Brown, long-grain rice (cooked), 1 cup / 3.5 g °· White, long-grain rice (cooked), 1 cup / 0.6 g °· Enriched macaroni (cooked), 1 cup / 2.5 g °Legumes / Dietary Fiber (g) °· Baked beans (canned, plain, or  vegetarian), ½ cup / 5.2 g °· Kidney beans (canned), ½ cup / 6.8 g °· Pinto beans (cooked), ½ cup / 5.5 g °Breads and Crackers / Dietary Fiber (g) °· Plain or honey graham crackers, 2 squares / 0.7 g °· Saltine crackers, 3 squares / 0.3 g °· Plain, salted pretzels, 10 pieces / 1.8 g °· Whole-wheat bread, 1 slice / 1.9 g °· White bread, 1 slice / 0.7 g °· Raisin bread, 1 slice / 1.2 g °· Plain bagel, 3 oz / 2 g °· Flour tortilla, 1 oz / 0.9 g °· Corn tortilla, 1 small / 1.5 g °· Hamburger or hotdog bun, 1 small / 0.9 g °Fruits / Dietary Fiber (g) °· Apple with skin, 1 medium / 4.4 g °· Sweetened applesauce, ½ cup / 1.5 g °· Banana, ½ medium / 1.5 g °· Grapes, 10 grapes / 0.4 g °· Orange, 1 small / 2.3 g °· Raisin, 1.5 oz / 1.6 g °· Melon, 1 cup / 1.4 g °Vegetables / Dietary Fiber (g) °· Green beans (canned), ½ cup / 1.3 g °· Carrots (cooked), ½ cup / 2.3 g °· Broccoli (cooked), ½ cup / 2.8 g °· Peas (cooked), ½ cup / 4.4 g °· Mashed potatoes, ½ cup / 1.6 g °· Lettuce, 1 cup / 0.5 g °· Corn (canned), ½ cup / 1.6 g °· Tomato, ½ cup / 1.1 g °Document Released: 04/13/2005 Document Revised: 10/13/2011 Document Reviewed: 07/16/2011 °[employer]® Patient   Information ©2015 [employer]. This information is not intended to replace advice given to you by your health care provider. Make sure you discuss any questions you have with your health care provider. °Colonoscopy, Care After °Refer to this sheet in the next few weeks. These instructions provide you with information on caring for yourself after your procedure. Your health care provider may also give you more specific instructions. Your treatment has been planned according to current medical practices, but problems sometimes occur. Call your health care provider if you have any problems or questions after your procedure. °WHAT TO EXPECT AFTER THE PROCEDURE  °After your procedure, it is typical to have the following: °· A small amount of blood in your stool. °· Moderate  amounts of gas and mild abdominal cramping or bloating. °HOME CARE INSTRUCTIONS °· Do not drive, operate machinery, or sign important documents for 24 hours. °· You may shower and resume your regular physical activities, but move at a slower pace for the first 24 hours. °· Take frequent rest periods for the first 24 hours. °· Walk around or put a warm pack on your abdomen to help reduce abdominal cramping and bloating. °· Drink enough fluids to keep your urine clear or pale yellow. °· You may resume your normal diet as instructed by your health care provider. Avoid heavy or fried foods that are hard to digest. °· Avoid drinking alcohol for 24 hours or as instructed by your health care provider. °· Only take over-the-counter or prescription medicines as directed by your health care provider. °· If a tissue sample (biopsy) was taken during your procedure: °¨ Do not take aspirin or blood thinners for 7 days, or as instructed by your health care provider. °¨ Do not drink alcohol for 7 days, or as instructed by your health care provider. °¨ Eat soft foods for the first 24 hours. °SEEK MEDICAL CARE IF: °You have persistent spotting of blood in your stool 2-3 days after the procedure. °SEEK IMMEDIATE MEDICAL CARE IF: °· You have more than a small spotting of blood in your stool. °· You pass large blood clots in your stool. °· Your abdomen is swollen (distended). °· You have nausea or vomiting. °· You have a fever. °· You have increasing abdominal pain that is not relieved with medicine. °Document Released: 11/26/2003 Document Revised: 02/01/2013 Document Reviewed: 12/19/2012 °[employer]® Patient Information ©2015 [employer]. This information is not intended to replace advice given to you by your health care provider. Make sure you discuss any questions you have with your health care provider. ° °

## 2014-06-29 ENCOUNTER — Encounter (HOSPITAL_COMMUNITY): Payer: Self-pay | Admitting: Internal Medicine

## 2014-07-03 ENCOUNTER — Other Ambulatory Visit: Payer: Self-pay | Admitting: Family Medicine

## 2014-07-03 ENCOUNTER — Encounter (INDEPENDENT_AMBULATORY_CARE_PROVIDER_SITE_OTHER): Payer: Self-pay | Admitting: *Deleted

## 2014-07-03 NOTE — Telephone Encounter (Signed)
Refill appropriate and filled per protocol. 

## 2014-07-16 ENCOUNTER — Ambulatory Visit (INDEPENDENT_AMBULATORY_CARE_PROVIDER_SITE_OTHER): Payer: 59 | Admitting: Family Medicine

## 2014-07-16 ENCOUNTER — Encounter: Payer: Self-pay | Admitting: Family Medicine

## 2014-07-16 VITALS — BP 132/76 | HR 72 | Temp 98.6°F | Resp 14 | Ht 72.0 in | Wt 295.0 lb

## 2014-07-16 DIAGNOSIS — E785 Hyperlipidemia, unspecified: Secondary | ICD-10-CM | POA: Diagnosis not present

## 2014-07-16 DIAGNOSIS — E119 Type 2 diabetes mellitus without complications: Secondary | ICD-10-CM | POA: Diagnosis not present

## 2014-07-16 DIAGNOSIS — E669 Obesity, unspecified: Secondary | ICD-10-CM

## 2014-07-16 DIAGNOSIS — I10 Essential (primary) hypertension: Secondary | ICD-10-CM | POA: Diagnosis not present

## 2014-07-16 DIAGNOSIS — M25519 Pain in unspecified shoulder: Secondary | ICD-10-CM

## 2014-07-16 LAB — COMPREHENSIVE METABOLIC PANEL
ALBUMIN: 4.2 g/dL (ref 3.5–5.2)
ALT: 30 U/L (ref 0–53)
AST: 19 U/L (ref 0–37)
Alkaline Phosphatase: 56 U/L (ref 39–117)
BUN: 18 mg/dL (ref 6–23)
CALCIUM: 9.6 mg/dL (ref 8.4–10.5)
CHLORIDE: 102 meq/L (ref 96–112)
CO2: 24 meq/L (ref 19–32)
CREATININE: 1.36 mg/dL — AB (ref 0.50–1.35)
Glucose, Bld: 183 mg/dL — ABNORMAL HIGH (ref 70–99)
POTASSIUM: 3.9 meq/L (ref 3.5–5.3)
Sodium: 140 mEq/L (ref 135–145)
Total Bilirubin: 1 mg/dL (ref 0.2–1.2)
Total Protein: 7.1 g/dL (ref 6.0–8.3)

## 2014-07-16 LAB — CBC WITH DIFFERENTIAL/PLATELET
BASOS PCT: 0 % (ref 0–1)
Basophils Absolute: 0 10*3/uL (ref 0.0–0.1)
Eosinophils Absolute: 0.3 10*3/uL (ref 0.0–0.7)
Eosinophils Relative: 4 % (ref 0–5)
HCT: 41.6 % (ref 39.0–52.0)
Hemoglobin: 13.8 g/dL (ref 13.0–17.0)
LYMPHS ABS: 2.2 10*3/uL (ref 0.7–4.0)
LYMPHS PCT: 35 % (ref 12–46)
MCH: 28.5 pg (ref 26.0–34.0)
MCHC: 33.2 g/dL (ref 30.0–36.0)
MCV: 86 fL (ref 78.0–100.0)
MONOS PCT: 8 % (ref 3–12)
MPV: 10.2 fL (ref 8.6–12.4)
Monocytes Absolute: 0.5 10*3/uL (ref 0.1–1.0)
NEUTROS ABS: 3.3 10*3/uL (ref 1.7–7.7)
NEUTROS PCT: 53 % (ref 43–77)
Platelets: 207 10*3/uL (ref 150–400)
RBC: 4.84 MIL/uL (ref 4.22–5.81)
RDW: 15.1 % (ref 11.5–15.5)
WBC: 6.3 10*3/uL (ref 4.0–10.5)

## 2014-07-16 LAB — LIPID PANEL
CHOLESTEROL: 181 mg/dL (ref 0–200)
HDL: 42 mg/dL (ref >=40)
LDL Cholesterol: 103 mg/dL — ABNORMAL HIGH (ref 0–99)
TRIGLYCERIDES: 179 mg/dL — AB (ref <150)
Total CHOL/HDL Ratio: 4.3 Ratio
VLDL: 36 mg/dL (ref 0–40)

## 2014-07-16 LAB — HEMOGLOBIN A1C
Hgb A1c MFr Bld: 7.1 % — ABNORMAL HIGH (ref <5.7)
Mean Plasma Glucose: 157 mg/dL — ABNORMAL HIGH (ref <117)

## 2014-07-16 NOTE — Progress Notes (Signed)
Patient ID: [name] [name], male   DOB: 03-30-1951, 64 y.o.   MRN: [id number]   Subjective:    Patient ID: [name] [name], male    DOB: Oct 09, 1950, 64 y.o.   MRN: [id number]  Patient presents for 4 month F/U  patient to follow chronic medical problems. He does complain that sometimes his shoulders get an ache in them feels like they're cold and only last for a couple minutes if he gets warm or uses icy hot the sensation goes away. He states he typically occur when he is cold or he has cold air blowing on his arm. He denies any pain otherwise denies any pain with activities. No injury.  Diabetes mellitus his last A1c was 7.2%. He has lost 3 or 4 pounds since her last visit 3 months ago. He is taking all his medications as prescribed. His fasting glucoses have ranged from 98 to 130  Highest glucose 180 when he ate some fruit. His blood pressures have ranged from 120-30s over 70 to 80s at home. No polyuria, polydipsia    Review Of Systems:  GEN- denies fatigue, fever, weight loss,weakness, recent illness HEENT- denies eye drainage, change in vision, nasal discharge, CVS- denies chest pain, palpitations RESP- denies SOB, cough, wheeze ABD- denies N/V, change in stools, abd pain GU- denies dysuria, hematuria, dribbling, incontinence MSK- + joint pain, muscle aches, injury Neuro- denies headache, dizziness, syncope, seizure activity       Objective:    BP 132/76 mmHg  Pulse 72  Temp(Src) 98.6 F (37 C) (Oral)  Resp 14  Ht 6' (1.829 m)  Wt 295 lb (133.811 kg)  BMI 40.00 kg/m2 GEN- NAD, alert and oriented x3 HEENT- PERRL, EOMI, non injected sclera, pink conjunctiva, MMM, oropharynx clear Neck- Supple,FROM CVS- RRR, no murmur RESP-CTAB MSK- Normal appearance bilat UE, FROM upper ext, rotator cuff in tact, biceps in tact, skin intact, no rash Neuro- normal tone UE, motor equal bilat, sensation intact EXT- No edema Pulses- Radial, DP- 2+        Assessment & Plan:      Problem List  Items Addressed This Visit      Unprioritized   Obesity   HYPERTENSION, BENIGN   Hyperlipidemia   Relevant Orders   Lipid panel   Diabetes mellitus, type II - Primary   Relevant Orders   CBC with Differential/Platelet   Comprehensive metabolic panel   Hemoglobin A1c      Note: This dictation was prepared with Dragon dictation along with smaller phrase technology. Any transcriptional errors that result from this process are unintentional.

## 2014-07-16 NOTE — Assessment & Plan Note (Signed)
Blood pressure is well-controlled no change in medication 

## 2014-07-16 NOTE — Assessment & Plan Note (Signed)
Diabetes is well-controlled I would still like to see his A1c less than 7% consistently. He is working on weight loss and dietary changes

## 2014-07-16 NOTE — Assessment & Plan Note (Signed)
Bilateral shoulder arm pain typically when cold air hits it. This may be a sign of some early degenerative changes however his range of motion his strength everything is intact and is not inhibiting any of his activities. We will hold on any imaging. He has not required any oral medications very rarely uses the topical icy hot. If this continues to worsen then I would recommend imaging.

## 2014-07-16 NOTE — Patient Instructions (Signed)
Continue current medications  I will call with lab results  F/U 4 months

## 2014-07-22 ENCOUNTER — Other Ambulatory Visit: Payer: Self-pay | Admitting: Family Medicine

## 2014-07-23 NOTE — Telephone Encounter (Signed)
Medication refilled per protocol. 

## 2014-08-28 ENCOUNTER — Other Ambulatory Visit: Payer: Self-pay | Admitting: Family Medicine

## 2014-08-29 NOTE — Telephone Encounter (Signed)
Refill appropriate and filled per protocol. 

## 2014-10-02 ENCOUNTER — Encounter (HOSPITAL_COMMUNITY): Payer: Self-pay | Admitting: Emergency Medicine

## 2014-10-02 ENCOUNTER — Emergency Department (HOSPITAL_COMMUNITY)
Admission: EM | Admit: 2014-10-02 | Discharge: 2014-10-02 | Disposition: A | Discharge status: Home / Self Care | Payer: Medicare Other | Attending: Emergency Medicine | Admitting: Emergency Medicine

## 2014-10-02 DIAGNOSIS — E119 Type 2 diabetes mellitus without complications: Secondary | ICD-10-CM | POA: Diagnosis not present

## 2014-10-02 DIAGNOSIS — G510 Bell's palsy: Secondary | ICD-10-CM | POA: Insufficient documentation

## 2014-10-02 DIAGNOSIS — Z79899 Other long term (current) drug therapy: Secondary | ICD-10-CM | POA: Diagnosis not present

## 2014-10-02 DIAGNOSIS — Z87891 Personal history of nicotine dependence: Secondary | ICD-10-CM | POA: Diagnosis not present

## 2014-10-02 DIAGNOSIS — I10 Essential (primary) hypertension: Secondary | ICD-10-CM | POA: Insufficient documentation

## 2014-10-02 DIAGNOSIS — Q6100 Congenital renal cyst, unspecified: Secondary | ICD-10-CM | POA: Diagnosis not present

## 2014-10-02 DIAGNOSIS — R29810 Facial weakness: Secondary | ICD-10-CM | POA: Diagnosis present

## 2014-10-02 LAB — CBG MONITORING, ED: GLUCOSE-CAPILLARY: 162 mg/dL — AB (ref 65–99)

## 2014-10-02 MED ORDER — VALACYCLOVIR HCL 1 G PO TABS: 1000.0000 mg | ORAL_TABLET | Freq: Three times a day (TID) | ORAL | Status: AC

## 2014-10-02 MED ORDER — PREDNISONE 20 MG PO TABS: ORAL_TABLET | ORAL | Status: DC

## 2014-10-02 NOTE — Discharge Instructions (Signed)
Bell's Palsy °Bell's palsy is a condition in which the muscles on one side of the face cannot move (paralysis). This is because the nerves in the face are paralyzed. It is most often thought to be caused by a virus. The virus causes swelling of the nerve that controls movement on one side of the face. The nerve travels through a tight space surrounded by bone. When the nerve swells, it can be compressed by the bone. This results in damage to the protective covering around the nerve. This damage interferes with how the nerve communicates with the muscles of the face. As a result, it can cause weakness or paralysis of the facial muscles.  °Injury (trauma), tumor, and surgery may cause Bell's palsy, but most of the time the cause is unknown. It is a relatively common condition. It starts suddenly (abrupt onset) with the paralysis usually ending within 2 days. Bell's palsy is not dangerous. But because the eye does not close properly, you may need care to keep the eye from getting dry. This can include splinting (to keep the eye shut) or moistening with artificial tears. Bell's palsy very seldom occurs on both sides of the face at the same time. °SYMPTOMS  °· Eyebrow sagging. °· Drooping of the eyelid and corner of the mouth. °· Inability to close one eye. °· Loss of taste on the front of the tongue. °· Sensitivity to loud noises. °TREATMENT  °The treatment is usually non-surgical. If the patient is seen within the first 24 to 48 hours, a short course of steroids may be prescribed, in an attempt to shorten the length of the condition. Antiviral medicines may also be used with the steroids, but it is unclear if they are helpful.  °You will need to protect your eye, if you cannot close it. The cornea (clear covering over your eye) will become dry and can be damaged. Artificial tears can be used to keep your eye moist. Glasses or an eye patch should be worn to protect your eye. °PROGNOSIS  °Recovery is variable, ranging  from days to months. Although the problem usually goes away completely (about 80% of cases resolve), predicting the outcome is impossible. Most people improve within 3 weeks of when the symptoms began. Improvement may continue for 3 to 6 months. A small number of people have moderate to severe weakness that is permanent.  °HOME CARE INSTRUCTIONS  °· If your caregiver prescribed medication to reduce swelling in the nerve, use as directed. Do not stop taking the medication unless directed by your caregiver. °· Use moisturizing eye drops as needed to prevent drying of your eye, as directed by your caregiver. °· Protect your eye, as directed by your caregiver. °· Use facial massage and exercises, as directed by your caregiver. °· Perform your normal activities, and get your normal rest. °SEEK IMMEDIATE MEDICAL CARE IF:  °· There is pain, redness or irritation in the eye. °· You or your child has an oral temperature above 102° F (38.9° C), not controlled by medicine. °MAKE SURE YOU:  °· Understand these instructions. °· Will watch your condition. °· Will get help right away if you are not doing well or get worse. °Document Released: 04/13/2005 Document Revised: 07/06/2011 Document Reviewed: 07/21/2013 °[employer]® Patient Information ©2015 [employer]. This information is not intended to replace advice given to you by your health care provider. Make sure you discuss any questions you have with your health care provider. ° °

## 2014-10-02 NOTE — ED Notes (Addendum)
PT c/o right sided lip numbness with mouth droop and unable to close right eye completely since yesterday at noon. PT denies any pain to face. No extremity weakness noted.

## 2014-10-02 NOTE — ED Provider Notes (Addendum)
CSN: [id number]     Arrival date & time 10/02/14  1130 History  This chart was scribed for Orpah Greek, MD by Rayna Sexton, ED scribe. This patient was seen in room [facility]18[facility] and the patient's care was started at 11:37 AM.   Chief Complaint  Patient presents with  . Facial Droop   The history is provided by the patient. No language interpreter was used.    HPI Comments: Keiondre Colee is a 64 y.o. male who presents to the Emergency Department complaining of intermittent, mild, numbness to the right side of his face with onset 1 day ago. Pt additionally notes facial drooping to the right side of his face as an associated symptom. He also notes chronic intermittent upper arm numbness but denies an increase in frequency since facial numbness occurred. Pt confirms drug allergies to codeine and aspirin. Pt denies tearing of his eyes.   Past Medical History  Diagnosis Date  . Diabetes mellitus   . Hypertension   . Hyperlipidemia   . Renal cyst 2013    Left hemorrhagic hilar cyst   Past Surgical History  Procedure Laterality Date  . Nasal sinus surgery    . Colonoscopy N/A 06/28/2014    Procedure: COLONOSCOPY;  Surgeon: Rogene Houston, MD;  Location: [facility];  Service: Endoscopy;  Laterality: N/A;  1030   History reviewed. No pertinent family history. History  Substance Use Topics  . Smoking status: Former Research scientist (life sciences)  . Smokeless tobacco: Never Used  . Alcohol Use: No    Review of Systems  Neurological: Positive for facial asymmetry and numbness.  All other systems reviewed and are negative.     Allergies  Aspirin and Codeine  Home Medications   Prior to Admission medications   Medication Sig Start Date End Date Taking? Authorizing Provider  glipiZIDE (GLUCOTROL XL) 10 MG 24 hr tablet TAKE 1 TABLET TWICE A DAY 07/03/14   Alycia Rossetti, MD  JANUMET 50-500 MG per tablet TAKE 1 TABLET TWICE A DAY WITH MEALS 07/23/14   Alycia Rossetti, MD   lisinopril-hydrochlorothiazide (PRINZIDE,ZESTORETIC) 20-25 MG per tablet TAKE 1 TABLET BY MOUTH DAILY. 08/29/14   Alycia Rossetti, MD  Multiple Vitamin (MULTIVITAMIN) capsule Take 1 capsule by mouth daily.      Historical Provider, MD  OVER THE COUNTER MEDICATION Take 1 tablet by mouth daily. T-Bomb II supplement: magnesium 15mg , zinc 25mg , copper 2 mg    Historical Provider, MD  predniSONE (DELTASONE) 20 MG tablet Take 3 tablets a day for 3 days, then take 2.5 tablets for 1 day, then take 2 tablets for 1 day, then take 1.5 tablets for 1 day, then take 1 tablet for 1 day, then take 0.5 tablet for 1 day, then stop 10/02/14   Orpah Greek, MD  valACYclovir (VALTREX) 1000 MG tablet Take 1 tablet (1,000 mg total) by mouth 3 (three) times daily. 10/02/14 10/16/14  Orpah Greek, MD   BP 137/88 mmHg  Pulse 73  Temp(Src) 97.8 F (36.6 C) (Oral)  Resp 23  Ht 6\' 1"  (1.854 m)  Wt 295 lb (133.811 kg)  BMI 38.93 kg/m2  SpO2 96% Physical Exam  Constitutional: He is oriented to person, place, and time. He appears well-developed and well-nourished. No distress.  HENT:  Head: Normocephalic and atraumatic.  Right Ear: Hearing normal.  Left Ear: Hearing normal.  Nose: Nose normal.  Mouth/Throat: Oropharynx is clear and moist and mucous membranes are normal.  Eyes: Conjunctivae and EOM  are normal. Pupils are equal, round, and reactive to light.  Neck: Normal range of motion. Neck supple.  Cardiovascular: Regular rhythm, S1 normal and S2 normal.  Exam reveals no gallop and no friction rub.   No murmur heard. Pulmonary/Chest: Effort normal and breath sounds normal. No respiratory distress. He exhibits no tenderness.  Abdominal: Soft. Normal appearance and bowel sounds are normal. There is no hepatosplenomegaly. There is no tenderness. There is no rebound, no guarding, no tenderness at McBurney's point and negative Murphy's sign. No hernia.  Musculoskeletal: Normal range of motion.   Neurological: He is alert and oriented to person, place, and time. He has normal strength. No cranial nerve deficit or sensory deficit. Coordination normal. GCS eye subscore is 4. GCS verbal subscore is 5. GCS motor subscore is 6.  Slight drooping of right nasal labial fold Inability to completely close or blink right eye  Extraocular muscle movement: normal No visual field cut Pupils: equal and reactive both direct and consensual response is normal No nystagmus present    Sensory function is intact to light touch, pinprick Proprioception intact  Grip strength 5/5 symmetric in upper extremities No pronator drift Normal finger to nose bilaterally  Lower extremity strength 5/5 against gravity Normal heel to shin bilaterally  Gait: normal   Skin: Skin is warm, dry and intact. No rash noted. No cyanosis.  Psychiatric: He has a normal mood and affect. His speech is normal and behavior is normal. Thought content normal.  Nursing note and vitals reviewed.   ED Course  Procedures  DIAGNOSTIC STUDIES: Oxygen Saturation is 98% on RA, normal by my interpretation.    COORDINATION OF CARE: 11:40 AM Discussed treatment plan with pt at bedside and pt agreed to plan.  Labs Review Labs Reviewed  CBG MONITORING, ED - Abnormal; Notable for the following:    Glucose-Capillary 162 (*)    All other components within normal limits    Imaging Review No results found.   EKG Interpretation   Date/Time:  Tuesday October 02 2014 11:40:14 EDT Ventricular Rate:  85 PR Interval:  176 QRS Duration: 81 QT Interval:  374 QTC Calculation: 445 R Axis:   4 Text Interpretation:  Sinus rhythm Low voltage, precordial leads Otherwise  within normal limits Confirmed by Ammy Lienhard  MD, Kadence Mikkelson [phone]) on  10/02/2014 12:07:15 PM      MDM   Final diagnoses:  Bell's palsy  Bell's Palsy   Patient presents to the ER because he has noticed that he has had some numbness and drooping of the right side  of his face since yesterday. He has not had any numbness, tingling or weakness of extremities. Neurologic examination was entirely normal other than very slight right facial droop. This did, however, involve his upper eyelid. Patient does not blink his right eye when he blinks. He has decreased ability to hold his eyelid closed against resistance. This is consistent with a Bell's palsy. There are no signs or symptoms that would suggest CVA. Patient reassured, will be initiated on treatment for Bell's palsy.  I personally performed the services described in this documentation, which was scribed in my presence. The recorded information has been reviewed and is accurate.         Orpah Greek, MD 10/02/14 1206  Orpah Greek, MD 10/02/14 1236

## 2014-10-02 NOTE — ED Notes (Signed)
Pt made aware to return if symptoms worsen or if any life threatening symptoms occur.   

## 2014-10-03 ENCOUNTER — Ambulatory Visit (INDEPENDENT_AMBULATORY_CARE_PROVIDER_SITE_OTHER): Payer: Medicare Other | Admitting: Family Medicine

## 2014-10-03 ENCOUNTER — Encounter: Payer: Self-pay | Admitting: Family Medicine

## 2014-10-03 VITALS — BP 140/80 | HR 76 | Temp 98.3°F | Resp 12 | Ht 72.0 in | Wt 298.0 lb

## 2014-10-03 DIAGNOSIS — G510 Bell's palsy: Secondary | ICD-10-CM

## 2014-10-03 DIAGNOSIS — E119 Type 2 diabetes mellitus without complications: Secondary | ICD-10-CM | POA: Diagnosis not present

## 2014-10-03 NOTE — Progress Notes (Signed)
Patient ID: [name] [name], male   DOB: Jul 17, 1950, 64 y.o.   MRN: [id number]   Subjective:    Patient ID: [name] [name], male    DOB: [birth year], 64 y.o.   MRN: [id number]  Patient presents for ER F/U  patient here to follow-up ER. He was seen in the ER yesterday after he had acute tingling and pulling on his face. He was diagnosed with Bell's palsy. He denies any change in sensation in the face denies any weakness in his upper or lower extremities there's been no change in his mentation. There were an EKG which was unremarkable he was started on prednisone as well as thou checks. His blood sugars did go up with the initial doses of prednisone given he states his blood sugar was 236 and as high as his blood pressure went up to 150/79 but had been down since then. He is taking all his medications as prescribed. He also started taking garlic and cinnamon to help with the side effects of the prednisone.    Review Of Systems:  GEN- denies fatigue, fever, weight loss,weakness, recent illness HEENT- denies eye drainage, change in vision, nasal discharge, CVS- denies chest pain, palpitations RESP- denies SOB, cough, wheeze ABD- denies N/V, change in stools, abd pain GU- denies dysuria, hematuria, dribbling, incontinence MSK- denies joint pain, muscle aches, injury Neuro- denies headache, dizziness, syncope, seizure activity       Objective:    BP 140/80 mmHg  Pulse 76  Temp(Src) 98.3 F (36.8 C) (Oral)  Resp 12  Ht 6' (1.829 m)  Wt 298 lb (135.172 kg)  BMI 40.41 kg/m2 GEN- NAD, alert and oriented x3 HEENT- PERRL, EOMI, non injected sclera, pink conjunctiva, MMM, oropharynx clear Neck- Supple, no thyromegaly CVS- RRR, no murmur RESP-CTAB Neuro- , CNII-VI in tact, - 8-12 intact, palsy of 7th nerve- unable to shut right eyelid compeltely, unable to smile on right side of face EXT- No edema Pulses- Radial, DP- 2+        Assessment & Plan:      Problem List Items Addressed This Visit     Diabetes mellitus, type II    Expect transient elevation in blood sugar, if this persist, will give more oral coverage  Continue current meds       Other Visit Diagnoses    Bell's palsy    -  Primary    Contineu prednisone, valtrex, add lubricating eye drops, advised can use eye patch at night if he still feels eye is drying out       Note: This dictation was prepared with Dragon dictation along with smaller phrase technology. Any transcriptional errors that result from this process are unintentional.

## 2014-10-03 NOTE — Assessment & Plan Note (Signed)
Expect transient elevation in blood sugar, if this persist, will give more oral coverage  Continue current meds

## 2014-10-03 NOTE — Patient Instructions (Signed)
Continue current medication Use lubricating drops Call if blood sugars go above 300  F/U Change f/u to the 19th of July

## 2014-11-06 ENCOUNTER — Telehealth: Payer: Self-pay | Admitting: Family Medicine

## 2014-11-06 MED ORDER — SITAGLIPTIN PHOS-METFORMIN HCL 50-500 MG PO TABS: 1.0000 | ORAL_TABLET | Freq: Two times a day (BID) | ORAL | Status: DC

## 2014-11-06 NOTE — Telephone Encounter (Signed)
Medication refilled per protocol. 

## 2014-11-13 ENCOUNTER — Encounter: Payer: Self-pay | Admitting: Family Medicine

## 2014-11-13 ENCOUNTER — Ambulatory Visit (INDEPENDENT_AMBULATORY_CARE_PROVIDER_SITE_OTHER): Payer: Medicare Other | Admitting: Family Medicine

## 2014-11-13 VITALS — BP 128/72 | HR 76 | Temp 98.0°F | Resp 14 | Ht 72.0 in | Wt 300.0 lb

## 2014-11-13 DIAGNOSIS — E669 Obesity, unspecified: Secondary | ICD-10-CM | POA: Diagnosis not present

## 2014-11-13 DIAGNOSIS — E785 Hyperlipidemia, unspecified: Secondary | ICD-10-CM

## 2014-11-13 DIAGNOSIS — E119 Type 2 diabetes mellitus without complications: Secondary | ICD-10-CM | POA: Diagnosis not present

## 2014-11-13 DIAGNOSIS — I10 Essential (primary) hypertension: Secondary | ICD-10-CM

## 2014-11-13 DIAGNOSIS — G510 Bell's palsy: Secondary | ICD-10-CM | POA: Diagnosis not present

## 2014-11-13 LAB — CBC WITH DIFFERENTIAL/PLATELET
BASOS ABS: 0 10*3/uL (ref 0.0–0.1)
BASOS PCT: 0 % (ref 0–1)
EOS PCT: 4 % (ref 0–5)
Eosinophils Absolute: 0.2 10*3/uL (ref 0.0–0.7)
HCT: 42.8 % (ref 39.0–52.0)
Hemoglobin: 14 g/dL (ref 13.0–17.0)
Lymphocytes Relative: 37 % (ref 12–46)
Lymphs Abs: 1.9 10*3/uL (ref 0.7–4.0)
MCH: 28.2 pg (ref 26.0–34.0)
MCHC: 32.7 g/dL (ref 30.0–36.0)
MCV: 86.3 fL (ref 78.0–100.0)
MPV: 10.9 fL (ref 8.6–12.4)
Monocytes Absolute: 0.4 10*3/uL (ref 0.1–1.0)
Monocytes Relative: 8 % (ref 3–12)
Neutro Abs: 2.6 10*3/uL (ref 1.7–7.7)
Neutrophils Relative %: 51 % (ref 43–77)
Platelets: 213 10*3/uL (ref 150–400)
RBC: 4.96 MIL/uL (ref 4.22–5.81)
RDW: 15.1 % (ref 11.5–15.5)
WBC: 5.1 10*3/uL (ref 4.0–10.5)

## 2014-11-13 LAB — COMPREHENSIVE METABOLIC PANEL
ALT: 30 U/L (ref 0–53)
AST: 19 U/L (ref 0–37)
Albumin: 4 g/dL (ref 3.5–5.2)
Alkaline Phosphatase: 50 U/L (ref 39–117)
BUN: 19 mg/dL (ref 6–23)
CO2: 28 meq/L (ref 19–32)
CREATININE: 1.23 mg/dL (ref 0.50–1.35)
Calcium: 9.7 mg/dL (ref 8.4–10.5)
Chloride: 102 mEq/L (ref 96–112)
GLUCOSE: 157 mg/dL — AB (ref 70–99)
Potassium: 4.4 mEq/L (ref 3.5–5.3)
SODIUM: 141 meq/L (ref 135–145)
Total Bilirubin: 0.7 mg/dL (ref 0.2–1.2)
Total Protein: 7.1 g/dL (ref 6.0–8.3)

## 2014-11-13 LAB — LIPID PANEL
CHOLESTEROL: 183 mg/dL (ref 0–200)
HDL: 42 mg/dL (ref >=40)
LDL CALC: 113 mg/dL — AB (ref 0–99)
TRIGLYCERIDES: 139 mg/dL (ref <150)
Total CHOL/HDL Ratio: 4.4 Ratio
VLDL: 28 mg/dL (ref 0–40)

## 2014-11-13 LAB — HEMOGLOBIN A1C
HEMOGLOBIN A1C: 7.9 % — AB (ref <5.7)
Mean Plasma Glucose: 180 mg/dL — ABNORMAL HIGH (ref <117)

## 2014-11-13 NOTE — Progress Notes (Signed)
Patient ID: [name] [name], male   DOB: 18-Jan-1951, 64 y.o.   MRN: [id number]   Subjective:    Patient ID: [name] [name], male    DOB: 03/21/1951, 64 y.o.   MRN: [id number]  Patient presents for 4 month F/U patient here to follow chronic medical problems. He is improved significantly from her last visit where he was diagnosed with Bell's palsy. He still has a slight asymmetry in his smile but is now able to eat and drink normally. He also still use a lubricating drop in his right eye but is able to close the eye completely.  Diabetes mellitus his last A1c was 7.1% and his blood sugars fasting are less than 140 there were elevated while he was on prednisone for the Bell's palsy but this is now resolved. His home blood pressure readings have been 120 to 130s over 70s to 80s. He has not had any hypoglycemia symptoms   Review Of Systems:  GEN- denies fatigue, fever, weight loss,weakness, recent illness HEENT- denies eye drainage, change in vision, nasal discharge, CVS- denies chest pain, palpitations RESP- denies SOB, cough, wheeze ABD- denies N/V, change in stools, abd pain GU- denies dysuria, hematuria, dribbling, incontinence MSK- denies joint pain, muscle aches, injury Neuro- denies headache, dizziness, syncope, seizure activity       Objective:    BP 128/72 mmHg  Pulse 76  Temp(Src) 98 F (36.7 C) (Oral)  Resp 14  Ht 6' (1.829 m)  Wt 300 lb (136.079 kg)  BMI 40.68 kg/m2 GEN- NAD, alert and oriented x3,obese  HEENT- PERRL, EOMI, non injected sclera, pink conjunctiva, MMM, oropharynx clear CVS- RRR, no murmur RESP-CTAB EXT- No edema Neuro-CNII-XII intact with mild assymptry of right lip with smile  Pulses- Radial, DP- 2+        Assessment & Plan:      Problem List Items Addressed This Visit    None      Note: This dictation was prepared with Dragon dictation along with smaller phrase technology. Any transcriptional errors that result from this process are unintentional.

## 2014-11-13 NOTE — Assessment & Plan Note (Signed)
Diabetes has been fairly well controlled. He would benefit from significant weight loss which we have yet to achieve. We'll recheck his labs today. Continue to encourage changes in his nutrition as well as aerobic exercise he is high risk for stroke or coronary artery disease

## 2014-11-13 NOTE — Patient Instructions (Signed)
Continue current medications We will call with labs F/U end of Nov for PHYSICAL

## 2014-11-13 NOTE — Assessment & Plan Note (Signed)
Cranial nerve appears to be healing quite well. Continue to monitor is not having any eye symptoms

## 2014-11-13 NOTE — Assessment & Plan Note (Signed)
Blood pressure is well-controlled no change in medication 

## 2014-11-16 ENCOUNTER — Ambulatory Visit: Payer: 59 | Admitting: Family Medicine

## 2014-11-21 ENCOUNTER — Other Ambulatory Visit: Payer: Self-pay | Admitting: Family Medicine

## 2014-11-21 MED ORDER — LISINOPRIL-HYDROCHLOROTHIAZIDE 20-25 MG PO TABS: 1.0000 | ORAL_TABLET | Freq: Every day | ORAL | Status: DC

## 2014-11-21 NOTE — Telephone Encounter (Signed)
Medication refilled per protocol. 

## 2015-02-02 ENCOUNTER — Other Ambulatory Visit: Payer: Self-pay | Admitting: Family Medicine

## 2015-02-04 NOTE — Telephone Encounter (Signed)
Refill appropriate and filled per protocol. 

## 2015-02-15 ENCOUNTER — Other Ambulatory Visit: Payer: Self-pay | Admitting: Family Medicine

## 2015-02-15 NOTE — Telephone Encounter (Signed)
Medication refilled per protocol. 

## 2015-03-19 ENCOUNTER — Encounter: Payer: Self-pay | Admitting: Family Medicine

## 2015-03-19 ENCOUNTER — Ambulatory Visit (INDEPENDENT_AMBULATORY_CARE_PROVIDER_SITE_OTHER): Payer: Medicare Other | Admitting: Family Medicine

## 2015-03-19 VITALS — BP 130/74 | HR 82 | Temp 98.2°F | Resp 14 | Ht 72.0 in | Wt 295.0 lb

## 2015-03-19 DIAGNOSIS — E785 Hyperlipidemia, unspecified: Secondary | ICD-10-CM | POA: Diagnosis not present

## 2015-03-19 DIAGNOSIS — E669 Obesity, unspecified: Secondary | ICD-10-CM

## 2015-03-19 DIAGNOSIS — M25519 Pain in unspecified shoulder: Secondary | ICD-10-CM | POA: Diagnosis not present

## 2015-03-19 DIAGNOSIS — Z125 Encounter for screening for malignant neoplasm of prostate: Secondary | ICD-10-CM | POA: Diagnosis not present

## 2015-03-19 DIAGNOSIS — E119 Type 2 diabetes mellitus without complications: Secondary | ICD-10-CM | POA: Diagnosis not present

## 2015-03-19 DIAGNOSIS — Z0000 Encounter for general adult medical examination without abnormal findings: Secondary | ICD-10-CM | POA: Diagnosis not present

## 2015-03-19 DIAGNOSIS — I10 Essential (primary) hypertension: Secondary | ICD-10-CM | POA: Diagnosis not present

## 2015-03-19 LAB — LIPID PANEL
CHOLESTEROL: 167 mg/dL (ref 125–200)
HDL: 43 mg/dL (ref >=40)
LDL CALC: 89 mg/dL (ref <130)
TRIGLYCERIDES: 174 mg/dL — AB (ref <150)
Total CHOL/HDL Ratio: 3.9 Ratio (ref <=5.0)
VLDL: 35 mg/dL — ABNORMAL HIGH (ref <30)

## 2015-03-19 LAB — COMPREHENSIVE METABOLIC PANEL
ALK PHOS: 55 U/L (ref 40–115)
ALT: 23 U/L (ref 9–46)
AST: 16 U/L (ref 10–35)
Albumin: 3.9 g/dL (ref 3.6–5.1)
BILIRUBIN TOTAL: 0.6 mg/dL (ref 0.2–1.2)
BUN: 13 mg/dL (ref 7–25)
CO2: 26 mmol/L (ref 20–31)
CREATININE: 1.18 mg/dL (ref 0.70–1.25)
Calcium: 9 mg/dL (ref 8.6–10.3)
Chloride: 102 mmol/L (ref 98–110)
GLUCOSE: 149 mg/dL — AB (ref 70–99)
Potassium: 3.9 mmol/L (ref 3.5–5.3)
Sodium: 140 mmol/L (ref 135–146)
TOTAL PROTEIN: 6.7 g/dL (ref 6.1–8.1)

## 2015-03-19 LAB — HEMOGLOBIN A1C
HEMOGLOBIN A1C: 6.9 % — AB (ref <5.7)
Mean Plasma Glucose: 151 mg/dL — ABNORMAL HIGH (ref <117)

## 2015-03-19 LAB — CBC WITH DIFFERENTIAL/PLATELET
BASOS ABS: 0 10*3/uL (ref 0.0–0.1)
Basophils Relative: 0 % (ref 0–1)
Eosinophils Absolute: 0.2 10*3/uL (ref 0.0–0.7)
Eosinophils Relative: 4 % (ref 0–5)
HEMATOCRIT: 43.1 % (ref 39.0–52.0)
HEMOGLOBIN: 13.9 g/dL (ref 13.0–17.0)
LYMPHS ABS: 1.4 10*3/uL (ref 0.7–4.0)
LYMPHS PCT: 27 % (ref 12–46)
MCH: 27.7 pg (ref 26.0–34.0)
MCHC: 32.3 g/dL (ref 30.0–36.0)
MCV: 86 fL (ref 78.0–100.0)
MPV: 10.2 fL (ref 8.6–12.4)
Monocytes Absolute: 0.4 10*3/uL (ref 0.1–1.0)
Monocytes Relative: 8 % (ref 3–12)
NEUTROS ABS: 3.2 10*3/uL (ref 1.7–7.7)
Neutrophils Relative %: 61 % (ref 43–77)
Platelets: 205 10*3/uL (ref 150–400)
RBC: 5.01 MIL/uL (ref 4.22–5.81)
RDW: 14.5 % (ref 11.5–15.5)
WBC: 5.3 10*3/uL (ref 4.0–10.5)

## 2015-03-19 MED ORDER — LISINOPRIL-HYDROCHLOROTHIAZIDE 20-25 MG PO TABS: 1.0000 | ORAL_TABLET | Freq: Every day | ORAL | Status: DC

## 2015-03-19 NOTE — Assessment & Plan Note (Signed)
Check A1C, goal < 7% continue to work on diet He is taking lipitor twice a week, but with UE muscle pain, will d/c for a few weeks to see if this resolves it. He was unable to tolerate taking daily

## 2015-03-19 NOTE — Progress Notes (Signed)
Patient ID: [name] [name], male   DOB: 1951-02-22, 64 y.o.   MRN: [id number] Subjective:   Patient presents for Medicare Annual/Subsequent preventive examination.   Pt here for welcome to medicare exam.   DM- controlled on oral medication, A1C was 7.9%, advised change but he just brough a 90 day supply of meds and could not afford any other meds. CBG range- 80-140 fasting,no hypoglycemia   [facility]-  HTN- BP at home ranges- 120-130/ 70-80's    Things to have episodes of soreness in bilateral arms with no particular injury. He has taken Lipitor twice a week. He has not tolerated every day. He does use over-the-counter heating patches and anti-inflammatories for his arms which does resolve the pain.  Review Past Medical/Family/Social: Per EMR   Risk Factors  Current exercise habits: walks Dietary issues discussed: yes  Cardiac risk factors: Obesity (BMI >= 30 kg/m2). HTN, DM  Depression Screen  (Note: if answer to either of the following is "Yes", a more complete depression screening is indicated)  Over the past two weeks, have you felt down, depressed or hopeless? No Over the past two weeks, have you felt little interest or pleasure in doing things? No Have you lost interest or pleasure in daily life? No Do you often feel hopeless? No Do you cry easily over simple problems? No   Activities of Daily Living  In your present state of health, do you have any difficulty performing the following activities?:  Driving? No  Managing money? No  Feeding yourself? No  Getting from bed to chair? No  Climbing a flight of stairs? No  Preparing food and eating?: No  Bathing or showering? No  Getting dressed: No  Getting to the toilet? No  Using the toilet:No  Moving around from place to place: No  In the past year have you fallen or had a near fall?:No  Are you sexually active? yes Do you have more than one partner? No   Hearing Difficulties: No  Do you often ask people  to speak up or repeat themselves? No  Do you experience ringing or noises in your ears? No Do you have difficulty understanding soft or whispered voices? No  Do you feel that you have a problem with memory? No Do you often misplace items? No  Do you feel safe at home? Yes  Cognitive Testing  Alert? Yes Normal Appearance?Yes  Oriented to person? Yes Place? Yes  Time? Yes  Recall of three objects? Yes  Can perform simple calculations? Yes  Displays appropriate judgment?Yes  Can read the correct time from a watch face?Yes   List the Names of Other Physician/Practitioners you currently use:   [facility]   Screening Tests / Date Colonoscopy     - UTD                Zostavax - Declines  Influenza Vaccine - Declines Tetanus/tdap- UTD  ROS: GEN- denies fatigue, fever, weight loss,weakness, recent illness HEENT- denies eye drainage, change in vision, nasal discharge, CVS- denies chest pain, palpitations RESP- denies SOB, cough, wheeze ABD- denies N/V, change in stools, abd pain GU- denies dysuria, hematuria, dribbling, incontinence MSK- denies joint pain, muscle aches, injury Neuro- denies headache, dizziness, syncope, seizure activity  PHYSICAL: GEN- NAD, alert and oriented x3 HEENT- PERRL, EOMI, non injected sclera, pink conjunctiva, MMM, oropharynx clear Neck- Supple, no thryomegaly CVS- RRR, no murmur RESP-CTAB ABD-NABS,soft,NT,ND MSK- FROM upper ext, NT to palpation UE Declined- rectal  exam EXT- No edema Pulses- Radial, DP- 2+   Assessment:    Annual wellness medicare exam   Plan:    During the course of the visit the patient was educated and counseled about appropriate screening and preventive services including:  Living will discussed Declines immunizations  Screen Neg for depression.     Diet review for nutrition referral? Yes ____ Not Indicated __x__  Patient Instructions (the written plan) was given to the patient.  Medicare Attestation  I have  personally reviewed:  The patient's medical and social history  Their use of alcohol, tobacco or illicit drugs  Their current medications and supplements  The patient's functional ability including ADLs,fall risks, home safety risks, cognitive, and hearing and visual impairment  Diet and physical activities  Evidence for depression or mood disorders  The patient's weight, height, BMI, and visual acuity have been recorded in the chart. I have made referrals, counseling, and provided education to the patient based on review of the above and I have provided the patient with a written personalized care plan for preventive services.

## 2015-03-19 NOTE — Assessment & Plan Note (Signed)
Well controlled 

## 2015-03-19 NOTE — Assessment & Plan Note (Signed)
bilat UE pain, over detolids,no injury, D/C statin drug completely Discussed if this does not help, referral to ortho, but he declines at this time

## 2015-03-19 NOTE — Patient Instructions (Addendum)
I recommend eye visit once a year- Call [facility] I recommend dental visit every 6 months Goal is to  Exercise 30 minutes 5 days a week We will send a letter with lab results F/U 4 months

## 2015-03-20 ENCOUNTER — Encounter: Payer: Self-pay | Admitting: *Deleted

## 2015-03-20 LAB — PSA, MEDICARE: PSA: 0.74 ng/mL (ref <=4.00)

## 2015-03-20 LAB — MICROALBUMIN / CREATININE URINE RATIO
CREATININE, URINE: 146 mg/dL (ref 20–370)
Microalb Creat Ratio: 10 mcg/mg creat (ref <30)
Microalb, Ur: 1.5 mg/dL

## 2015-05-03 ENCOUNTER — Other Ambulatory Visit: Payer: Self-pay | Admitting: Family Medicine

## 2015-05-03 NOTE — Telephone Encounter (Signed)
Refill appropriate and filled per protocol. 

## 2015-08-19 ENCOUNTER — Other Ambulatory Visit: Payer: Self-pay | Admitting: Family Medicine

## 2015-08-19 ENCOUNTER — Ambulatory Visit (INDEPENDENT_AMBULATORY_CARE_PROVIDER_SITE_OTHER): Payer: Medicare Other | Admitting: Family Medicine

## 2015-08-19 ENCOUNTER — Encounter: Payer: Self-pay | Admitting: Family Medicine

## 2015-08-19 VITALS — BP 130/82 | HR 88 | Temp 98.5°F | Resp 16 | Ht 72.0 in | Wt 294.0 lb

## 2015-08-19 DIAGNOSIS — W57XXXA Bitten or stung by nonvenomous insect and other nonvenomous arthropods, initial encounter: Secondary | ICD-10-CM

## 2015-08-19 DIAGNOSIS — E669 Obesity, unspecified: Secondary | ICD-10-CM

## 2015-08-19 DIAGNOSIS — T148 Other injury of unspecified body region: Secondary | ICD-10-CM | POA: Diagnosis not present

## 2015-08-19 DIAGNOSIS — E119 Type 2 diabetes mellitus without complications: Secondary | ICD-10-CM | POA: Diagnosis not present

## 2015-08-19 DIAGNOSIS — I10 Essential (primary) hypertension: Secondary | ICD-10-CM

## 2015-08-19 DIAGNOSIS — E785 Hyperlipidemia, unspecified: Secondary | ICD-10-CM

## 2015-08-19 LAB — COMPREHENSIVE METABOLIC PANEL
ALBUMIN: 4.2 g/dL (ref 3.6–5.1)
ALT: 29 U/L (ref 9–46)
AST: 25 U/L (ref 10–35)
Alkaline Phosphatase: 52 U/L (ref 40–115)
BILIRUBIN TOTAL: 0.7 mg/dL (ref 0.2–1.2)
BUN: 13 mg/dL (ref 7–25)
CALCIUM: 9.2 mg/dL (ref 8.6–10.3)
CHLORIDE: 102 mmol/L (ref 98–110)
CO2: 26 mmol/L (ref 20–31)
Creat: 1.17 mg/dL (ref 0.70–1.25)
GLUCOSE: 155 mg/dL — AB (ref 70–99)
POTASSIUM: 4.1 mmol/L (ref 3.5–5.3)
Sodium: 140 mmol/L (ref 135–146)
Total Protein: 7 g/dL (ref 6.1–8.1)

## 2015-08-19 LAB — CBC WITH DIFFERENTIAL/PLATELET
BASOS ABS: 0 {cells}/uL (ref 0–200)
Basophils Relative: 0 %
EOS ABS: 188 {cells}/uL (ref 15–500)
Eosinophils Relative: 4 %
HEMATOCRIT: 41.5 % (ref 38.5–50.0)
HEMOGLOBIN: 13.9 g/dL (ref 13.0–17.0)
LYMPHS ABS: 1504 {cells}/uL (ref 850–3900)
LYMPHS PCT: 32 %
MCH: 28.7 pg (ref 27.0–33.0)
MCHC: 33.5 g/dL (ref 32.0–36.0)
MCV: 85.6 fL (ref 80.0–100.0)
MONO ABS: 376 {cells}/uL (ref 200–950)
MPV: 10.4 fL (ref 7.5–12.5)
Monocytes Relative: 8 %
NEUTROS PCT: 56 %
Neutro Abs: 2632 cells/uL (ref 1500–7800)
Platelets: 210 10*3/uL (ref 140–400)
RBC: 4.85 MIL/uL (ref 4.20–5.80)
RDW: 14.7 % (ref 11.0–15.0)
WBC: 4.7 10*3/uL (ref 3.8–10.8)

## 2015-08-19 LAB — LIPID PANEL
CHOL/HDL RATIO: 4.5 ratio (ref <=5.0)
Cholesterol: 192 mg/dL (ref 125–200)
HDL: 43 mg/dL (ref >=40)
LDL CALC: 124 mg/dL (ref <130)
Triglycerides: 124 mg/dL (ref <150)
VLDL: 25 mg/dL (ref <30)

## 2015-08-19 LAB — HEMOGLOBIN A1C
Hgb A1c MFr Bld: 7.3 % — ABNORMAL HIGH (ref <5.7)
MEAN PLASMA GLUCOSE: 163 mg/dL

## 2015-08-19 NOTE — Assessment & Plan Note (Signed)
Diabetes has been well controlled. With his fasting blood sugars as low a maybe able to stop his glipizide. We'll recheck his levels today. He is on ACE inhibitor as blood pressure is well controlled. Continue to reiterate importance of dietary changes and weight loss. Also she needs to schedule an eye appointment which she was reminded of again today.

## 2015-08-19 NOTE — Telephone Encounter (Signed)
Refill appropriate and filled per protocol. 

## 2015-08-19 NOTE — Progress Notes (Signed)
Patient ID: [name] [name], male   DOB: 1950/11/06, 65 y.o.   MRN: [id number]    Subjective:    Patient ID: [name] [name], male    DOB: 12-19-1950, 65 y.o.   MRN: [id number]  Patient presents for 4 month F/U   Pt here to F/U chronic medical problems.  DM- last A1C 6.9%, currently on Janumet, glipizide. Weight down only 1 lb since last visit  In Nov   CBG range 70-84 fasting , no Focal glycemia. He has joined the senior center and is still exercising a few days a week.  HTN- taking linsipirl HCTZ , side effects of medications his blood pressure at home runs 120-135/70 to 80s   He was bitten by mosquitoes on his left forearm and antecubital fossa. He uses praying oil to stop the itching. He states that only itches at night now.    Review Of Systems:  GEN- denies fatigue, fever, weight loss,weakness, recent illness HEENT- denies eye drainage, change in vision, nasal discharge, CVS- denies chest pain, palpitations RESP- denies SOB, cough, wheeze ABD- denies N/V, change in stools, abd pain GU- denies dysuria, hematuria, dribbling, incontinence MSK- denies joint pain, muscle aches, injury Neuro- denies headache, dizziness, syncope, seizure activity       Objective:    BP 130/82 mmHg  Pulse 88  Temp(Src) 98.5 F (36.9 C) (Oral)  Resp 16  Ht 6' (1.829 m)  Wt 294 lb (133.358 kg)  BMI 39.86 kg/m2 GEN- NAD, alert and oriented x3 HEENT- PERRL, EOMI, non injected sclera, pink conjunctiva, MMM, oropharynx clear CVS- RRR, no murmur RESP-CTAB ABD-NABS,soft,NT,ND Skin- Left forearm- erythema, few bug bites scattered mostly scabs, +excoriations  EXT- No edema Pulses- Radial, DP- 2+        Assessment & Plan:      Problem List Items Addressed This Visit    Obesity   HYPERTENSION, BENIGN    Well controlled      Hyperlipidemia   Relevant Orders   Lipid panel   Diabetes mellitus, type II ([place]) - Primary    Diabetes has been well controlled. With his fasting blood sugars as low a  maybe able to stop his glipizide. We'll recheck his levels today. He is on ACE inhibitor as blood pressure is well controlled. Continue to reiterate importance of dietary changes and weight loss. Also she needs to schedule an eye appointment which she was reminded of again today.      Relevant Orders   CBC with Differential/Platelet   Comprehensive metabolic panel   Hemoglobin A1c    Other Visit Diagnoses    Mosquito bite        Localized inflammation from mosquito will use hydrocortisone cream       Note: This dictation was prepared with Dragon dictation along with smaller phrase technology. Any transcriptional errors that result from this process are unintentional.

## 2015-08-19 NOTE — Patient Instructions (Signed)
Use cortisone 1% over the counter  We will call with lab results  F/U 4 months

## 2015-08-19 NOTE — Assessment & Plan Note (Signed)
Well controlled 

## 2015-08-22 ENCOUNTER — Encounter: Payer: Self-pay | Admitting: *Deleted

## 2015-09-22 ENCOUNTER — Other Ambulatory Visit: Payer: Self-pay | Admitting: Family Medicine

## 2015-09-24 NOTE — Telephone Encounter (Signed)
Refill appropriate and filled per protocol. 

## 2015-10-14 ENCOUNTER — Emergency Department (HOSPITAL_COMMUNITY)
Admission: EM | Admit: 2015-10-14 | Discharge: 2015-10-14 | Disposition: A | Discharge status: Home / Self Care | Payer: Medicare Other | Attending: Emergency Medicine | Admitting: Emergency Medicine

## 2015-10-14 ENCOUNTER — Encounter (HOSPITAL_COMMUNITY): Payer: Self-pay | Admitting: Emergency Medicine

## 2015-10-14 DIAGNOSIS — I10 Essential (primary) hypertension: Secondary | ICD-10-CM | POA: Insufficient documentation

## 2015-10-14 DIAGNOSIS — R040 Epistaxis: Secondary | ICD-10-CM | POA: Insufficient documentation

## 2015-10-14 DIAGNOSIS — Z7984 Long term (current) use of oral hypoglycemic drugs: Secondary | ICD-10-CM | POA: Insufficient documentation

## 2015-10-14 DIAGNOSIS — E785 Hyperlipidemia, unspecified: Secondary | ICD-10-CM | POA: Insufficient documentation

## 2015-10-14 DIAGNOSIS — Z87891 Personal history of nicotine dependence: Secondary | ICD-10-CM | POA: Insufficient documentation

## 2015-10-14 DIAGNOSIS — Z79899 Other long term (current) drug therapy: Secondary | ICD-10-CM | POA: Diagnosis not present

## 2015-10-14 DIAGNOSIS — E119 Type 2 diabetes mellitus without complications: Secondary | ICD-10-CM | POA: Insufficient documentation

## 2015-10-14 MED ORDER — LIDOCAINE-EPINEPHRINE (PF) 1 %-1:200000 IJ SOLN
INTRAMUSCULAR | Status: AC
  Administered 2015-10-14: 30 mL
  Filled 2015-10-14: qty 30

## 2015-10-14 MED ORDER — OXYMETAZOLINE HCL 0.05 % NA SOLN
NASAL | Status: AC
  Filled 2015-10-14: qty 15

## 2015-10-14 NOTE — Discharge Instructions (Signed)

## 2015-10-14 NOTE — ED Provider Notes (Signed)
History  By signing my name below, I, Bea Graff, attest that this documentation has been prepared under the direction and in the presence of Ripley Fraise, MD. Electronically Signed: Bea Graff, ED Scribe. 10/14/2015. 9:40 AM.  Chief Complaint  Patient presents with  . Epistaxis   Patient is a 65 y.o. male presenting with nosebleeds. The history is provided by the patient and medical records. No language interpreter was used.  Epistaxis Location:  Bilateral Severity:  Moderate Duration:  3 hours Timing:  Constant Progression:  Improving Chronicity:  Recurrent Context: not anticoagulants   Relieved by:  Applying pressure (cotton insertion) Worsened by:  Heat Associated symptoms: no fever and no sore throat   Risk factors: frequent nosebleeds     HPI Comments:  [name] [name] is a 65 y.o. obese male who presents to the Emergency Department complaining of epistaxis that began earlier this morning from his right nostril. He reports his left nostril began bleeding a short time after. He states he has had nose bleeds in the past that he resolved by packing the nares with Neosporin and cotton. He has done this with this episode without complete resolution. Pt states getting too hot causes the epistaxis. He denies alleviating factors. He denies CP, SOB, fever, chills, nausea, vomiting. Pt reports having nasal sinus surgery about 5-6 years ago. He denies anticoagulant therapy.  Past Medical History  Diagnosis Date  . Diabetes mellitus   . Hypertension   . Hyperlipidemia   . Renal cyst 2013    Left hemorrhagic hilar cyst   Past Surgical History  Procedure Laterality Date  . Nasal sinus surgery    . Colonoscopy N/A 06/28/2014    Procedure: COLONOSCOPY;  Surgeon: Rogene Houston, MD;  Location: [facility];  Service: Endoscopy;  Laterality: N/A;  1030   No family history on file. Social History  Substance Use Topics  . Smoking status: Former Research scientist (life sciences)  . Smokeless tobacco:  Never Used  . Alcohol Use: No    Review of Systems  Constitutional: Negative for fever.  HENT: Positive for nosebleeds. Negative for sore throat.   All other systems reviewed and are negative.   Allergies  Aspirin and Codeine  Home Medications   Prior to Admission medications   Medication Sig Start Date End Date Taking? Authorizing Provider  Cinnamon 500 MG capsule Take 500 mg by mouth daily as needed.    Historical Provider, MD  glipiZIDE (GLUCOTROL XL) 10 MG 24 hr tablet TAKE 1 TABLET TWICE A DAY 09/24/15   Alycia Rossetti, MD  JANUMET 50-500 MG tablet TAKE 1 TABLET BY MOUTH TWICE A DAY WITH FOOD 08/19/15   Alycia Rossetti, MD  lisinopril-hydrochlorothiazide (PRINZIDE,ZESTORETIC) 20-25 MG tablet Take 1 tablet by mouth daily. 03/19/15   Alycia Rossetti, MD  Multiple Vitamin (MULTIVITAMIN) capsule Take 1 capsule by mouth daily.      Historical Provider, MD  Omega-3 Fatty Acids (FISH OIL) 1000 MG CAPS Take 1 capsule by mouth daily.    Historical Provider, MD  ONE TOUCH ULTRA TEST test strip TEST 1-2 TIMES DAILY 10/11/14   Historical Provider, MD   Triage Vitals: BP 139/86 mmHg  Pulse 91  Temp(Src) 98.5 F (36.9 C)  Resp 18  Ht 6\' 1"  (1.854 m)  Wt 230 lb (104.327 kg)  BMI 30.35 kg/m2  SpO2 98%  Physical Exam  CONSTITUTIONAL: Well developed/well nourished HEAD: Normocephalic/atraumatic EYES: EOMI/PERRL ENMT: Mucous membranes moist, blood noted in each nare, no active bleeding, no bleeding  in oropharynx NECK: supple no meningeal signs SPINE/BACK:entire spine nontender CV: S1/S2 noted, no murmurs/rubs/gallops noted LUNGS: Lungs are clear to auscultation bilaterally, no apparent distress ABDOMEN: soft, nontender NEURO: Pt is awake/alert/appropriate, moves all extremitiesx4.  No facial droop.   EXTREMITIES: pulses normal/equal, full ROM SKIN: warm, color normal PSYCH: no abnormalities of mood noted, alert and oriented to situation   ED Course  Procedures  DIAGNOSTIC  STUDIES: Oxygen Saturation is 98% on RA, normal by my interpretation.   COORDINATION OF CARE: 9:35 AM- Will order Afrin nasal spray. Pt verbalizes understanding and agrees to plan.  Medications  oxymetazoline (AFRIN) 0.05 % nasal spray (not administered)  lidocaine-EPINEPHrine (XYLOCAINE-EPINEPHrine) 1 %-1:200000 (PF) injection (not administered)     MDM   Final diagnoses:  Epistaxis    .Nursing notes including past medical history and social history reviewed and considered in documentation   I personally performed the services described in this documentation, which was scribed in my presence. The recorded information has been reviewed and is accurate.       Ripley Fraise, MD 10/14/15 1029

## 2015-10-14 NOTE — ED Notes (Signed)
Pt c/o nosebleed this am x 15 minutes from both nares.

## 2015-11-22 ENCOUNTER — Other Ambulatory Visit: Payer: Self-pay | Admitting: *Deleted

## 2015-11-22 MED ORDER — SITAGLIPTIN PHOS-METFORMIN HCL 50-500 MG PO TABS: 1.0000 | ORAL_TABLET | Freq: Two times a day (BID) | ORAL | 0 refills | Status: DC

## 2015-11-22 NOTE — Telephone Encounter (Signed)
Received fax requesting refill on Janumet.   Refill appropriate and filled per protocol.

## 2015-12-02 ENCOUNTER — Other Ambulatory Visit: Payer: Self-pay | Admitting: Family Medicine

## 2015-12-20 ENCOUNTER — Ambulatory Visit (INDEPENDENT_AMBULATORY_CARE_PROVIDER_SITE_OTHER): Payer: Medicare Other | Admitting: Family Medicine

## 2015-12-20 ENCOUNTER — Encounter: Payer: Self-pay | Admitting: Family Medicine

## 2015-12-20 VITALS — BP 132/72 | HR 76 | Temp 98.7°F | Resp 16 | Ht 73.0 in | Wt 292.0 lb

## 2015-12-20 DIAGNOSIS — E119 Type 2 diabetes mellitus without complications: Secondary | ICD-10-CM | POA: Diagnosis not present

## 2015-12-20 DIAGNOSIS — I10 Essential (primary) hypertension: Secondary | ICD-10-CM

## 2015-12-20 DIAGNOSIS — E785 Hyperlipidemia, unspecified: Secondary | ICD-10-CM

## 2015-12-20 LAB — CBC WITH DIFFERENTIAL/PLATELET
BASOS ABS: 0 {cells}/uL (ref 0–200)
Basophils Relative: 0 %
EOS ABS: 188 {cells}/uL (ref 15–500)
Eosinophils Relative: 4 %
HCT: 42.1 % (ref 38.5–50.0)
Hemoglobin: 13.9 g/dL (ref 13.0–17.0)
LYMPHS PCT: 39 %
Lymphs Abs: 1833 cells/uL (ref 850–3900)
MCH: 28.1 pg (ref 27.0–33.0)
MCHC: 33 g/dL (ref 32.0–36.0)
MCV: 85.2 fL (ref 80.0–100.0)
MONOS PCT: 8 %
MPV: 10.6 fL (ref 7.5–12.5)
Monocytes Absolute: 376 cells/uL (ref 200–950)
NEUTROS PCT: 49 %
Neutro Abs: 2303 cells/uL (ref 1500–7800)
PLATELETS: 213 10*3/uL (ref 140–400)
RBC: 4.94 MIL/uL (ref 4.20–5.80)
RDW: 14.8 % (ref 11.0–15.0)
WBC: 4.7 10*3/uL (ref 3.8–10.8)

## 2015-12-20 LAB — COMPREHENSIVE METABOLIC PANEL
ALT: 25 U/L (ref 9–46)
AST: 19 U/L (ref 10–35)
Albumin: 4.1 g/dL (ref 3.6–5.1)
Alkaline Phosphatase: 49 U/L (ref 40–115)
BUN: 15 mg/dL (ref 7–25)
CHLORIDE: 102 mmol/L (ref 98–110)
CO2: 26 mmol/L (ref 20–31)
Calcium: 9.5 mg/dL (ref 8.6–10.3)
Creat: 1.26 mg/dL — ABNORMAL HIGH (ref 0.70–1.25)
GLUCOSE: 133 mg/dL — AB (ref 70–99)
POTASSIUM: 3.9 mmol/L (ref 3.5–5.3)
Sodium: 141 mmol/L (ref 135–146)
Total Bilirubin: 0.8 mg/dL (ref 0.2–1.2)
Total Protein: 6.8 g/dL (ref 6.1–8.1)

## 2015-12-20 LAB — LIPID PANEL
CHOL/HDL RATIO: 4.5 ratio (ref <=5.0)
Cholesterol: 212 mg/dL — ABNORMAL HIGH (ref 125–200)
HDL: 47 mg/dL (ref >=40)
LDL Cholesterol: 136 mg/dL — ABNORMAL HIGH (ref <130)
TRIGLYCERIDES: 144 mg/dL (ref <150)
VLDL: 29 mg/dL (ref <30)

## 2015-12-20 MED ORDER — LISINOPRIL-HYDROCHLOROTHIAZIDE 20-25 MG PO TABS: 1.0000 | ORAL_TABLET | Freq: Every day | ORAL | 2 refills | Status: DC

## 2015-12-20 MED ORDER — SITAGLIPTIN PHOS-METFORMIN HCL 50-500 MG PO TABS: 1.0000 | ORAL_TABLET | Freq: Two times a day (BID) | ORAL | 2 refills | Status: DC

## 2015-12-20 MED ORDER — GLIPIZIDE ER 10 MG PO TB24: 10.0000 mg | ORAL_TABLET | Freq: Two times a day (BID) | ORAL | 2 refills | Status: DC

## 2015-12-20 NOTE — Patient Instructions (Signed)
We will call with lab results F/U 4 months  

## 2015-12-20 NOTE — Assessment & Plan Note (Signed)
Blood pressure well-controlled or change medications. We'll check his lipid panel as well. We had a discussion about restart his statin drug did not really do so at that time. States that he is change his diet more. Diabetes mellitus his goal is less than 7% his fasting blood sugars have improved Fasting labs to be done today. Continue to reiterate importance of weight loss healthy eating. Schedule ophthalmology visit

## 2015-12-20 NOTE — Progress Notes (Signed)
Subjective:    Patient ID: [name] [name], male    DOB: [birth year], 65 y.o.   MRN: [id number]  Patient presents for Follow-up (4 month- is fasting)  Pt here to f/u DM- last A1C  7.3% , fasting blood sugars range,  Range from 120s to 130s his lowest blood sugar is around 89 at that time he will feel a little hypoglycemic. His blood pressures have ranged 119-134 over 70s to 80s. He is staying active states that he mows lawns a lot and does a lot of outside yardwork which heaves and busy. He has not seen ophthalmologist Will plan to see them this fall.   Hyperlipidemia- last cholesterol LDL up to 124, recommended statin pt declined  He did have one ER visit since her last office visit for epistaxis this has not reoccurred.  Review Of Systems:  GEN- denies fatigue, fever, weight loss,weakness, recent illness HEENT- denies eye drainage, change in vision, nasal discharge, CVS- denies chest pain, palpitations RESP- denies SOB, cough, wheeze ABD- denies N/V, change in stools, abd pain GU- denies dysuria, hematuria, dribbling, incontinence MSK- denies joint pain, muscle aches, injury Neuro- denies headache, dizziness, syncope, seizure activity       Objective:    BP 132/72 (BP Location: Right Arm, Patient Position: Sitting, Cuff Size: Large)   Pulse 76   Temp 98.7 F (37.1 C) (Oral)   Resp 16   Ht 6\' 1"  (1.854 m)   Wt 292 lb (132.5 kg)   BMI 38.52 kg/m  GEN- NAD, alert and oriented x3,obese  Patient ID: [name] [name], male   DOB: 1950/11/06, 65 y.o.   MRN: [id number]    Subjective:    Patient ID: [name] [name], male    DOB: 1951-03-28, 65 y.o.   MRN: [id number]  Patient presents for Follow-up (4 month- is fasting)   Pt here to F/U chronic medical problems.  DM- last A1C 6.9%, currently on Janumet, glipizide. Weight down only 1 lb since last visit  In Nov   CBG range 70-84 fasting , no Focal glycemia. He has joined the senior center and is still exercising a few days a week.  HTN-  taking linsipirl HCTZ , side effects of medications his blood pressure at home runs 120-135/70 to 80s   He was bitten by mosquitoes on his left forearm and antecubital fossa. He uses praying oil to stop the itching. He states that only itches at night now.    Review Of Systems:  GEN- denies fatigue, fever, weight loss,weakness, recent illness HEENT- denies eye drainage, change in vision, nasal discharge, CVS- denies chest pain, palpitations RESP- denies SOB, cough, wheeze ABD- denies N/V, change in stools, abd pain GU- denies dysuria, hematuria, dribbling, incontinence MSK- denies joint pain, muscle aches, injury Neuro- denies headache, dizziness, syncope, seizure activity       Objective:    BP 132/72 (BP Location: Right Arm, Patient Position: Sitting, Cuff Size: Large)   Pulse 76   Temp 98.7 F (37.1 C) (Oral)   Resp 16   Ht 6\' 1"  (1.854 m)   Wt 292 lb (132.5 kg)   BMI 38.52 kg/m  GEN- NAD, alert and oriented x3 HEENT- PERRL, EOMI, non injected sclera, pink conjunctiva, MMM, oropharynx clear CVS- RRR, no murmur RESP-CTAB ABD-NABS,soft,NT,ND Skin- Left forearm- erythema, few bug bites scattered mostly scabs, +excoriations  EXT- No edema Pulses- Radial, DP- 2+        Assessment & Plan:      Problem List Items Addressed This  Visit    HYPERTENSION, BENIGN - Primary    Blood pressure well-controlled or change medications. We'll check his lipid panel as well. We had a discussion about restart his statin drug did not really do so at that time. States that he is change his diet more. Diabetes mellitus his goal is less than 7% his fasting blood sugars have improved Fasting labs to be done today. Continue to reiterate importance of weight loss healthy eating. Schedule ophthalmology visit      Relevant Medications   lisinopril-hydrochlorothiazide (PRINZIDE,ZESTORETIC) 20-25 MG tablet   Other Relevant Orders   CBC with Differential/Platelet   Comprehensive metabolic  panel   Hyperlipidemia   Relevant Medications   lisinopril-hydrochlorothiazide (PRINZIDE,ZESTORETIC) 20-25 MG tablet   Other Relevant Orders   Lipid panel   Diabetes mellitus, type II (HCC)   Relevant Medications   glipiZIDE (GLUCOTROL XL) 10 MG 24 hr tablet   lisinopril-hydrochlorothiazide (PRINZIDE,ZESTORETIC) 20-25 MG tablet   sitaGLIPtin-metformin (JANUMET) 50-500 MG tablet   Other Relevant Orders   Hemoglobin A1c    Other Visit Diagnoses   None.     Note: This dictation was prepared with Dragon dictation along with smaller phrase technology. Any transcriptional errors that result from this process are unintentional.     HEENT- PERRL, EOMI, non injected sclera, pink conjunctiva, MMM, oropharynx clear Neck- Supple, no thyromegaly CVS- RRR, no murmur RESP-CTAB ABD-NABS,soft,NT,ND EXT- No edema Pulses- Radial, DP- 2+        Assessment & Plan:      Problem List Items Addressed This Visit    HYPERTENSION, BENIGN - Primary    Blood pressure well-controlled or change medications. We'll check his lipid panel as well. We had a discussion about restart his statin drug did not really do so at that time. States that he is change his diet more. Diabetes mellitus his goal is less than 7% his fasting blood sugars have improved Fasting labs to be done today. Continue to reiterate importance of weight loss healthy eating. Schedule ophthalmology visit      Relevant Medications   lisinopril-hydrochlorothiazide (PRINZIDE,ZESTORETIC) 20-25 MG tablet   Other Relevant Orders   CBC with Differential/Platelet   Comprehensive metabolic panel   Hyperlipidemia   Relevant Medications   lisinopril-hydrochlorothiazide (PRINZIDE,ZESTORETIC) 20-25 MG tablet   Other Relevant Orders   Lipid panel   Diabetes mellitus, type II (HCC)   Relevant Medications   glipiZIDE (GLUCOTROL XL) 10 MG 24 hr tablet   lisinopril-hydrochlorothiazide (PRINZIDE,ZESTORETIC) 20-25 MG tablet    sitaGLIPtin-metformin (JANUMET) 50-500 MG tablet   Other Relevant Orders   Hemoglobin A1c    Other Visit Diagnoses   None.     Note: This dictation was prepared with Dragon dictation along with smaller phrase technology. Any transcriptional errors that result from this process are unintentional.

## 2015-12-21 LAB — HEMOGLOBIN A1C
HEMOGLOBIN A1C: 6.6 % — AB (ref <5.7)
Mean Plasma Glucose: 143 mg/dL

## 2015-12-25 ENCOUNTER — Telehealth: Payer: Self-pay

## 2015-12-25 NOTE — Telephone Encounter (Signed)
Spoke to spouse Bonnita Nasuti. Gave lab results and instructions. Spouse verbalized understanding.  Spouse prefers patient make decision to take Rx or not. Will have patient to c/b if he agrees to start Lipitor.

## 2015-12-25 NOTE — Telephone Encounter (Signed)
-----   Message from Susy Frizzle, MD sent at 12/23/2015  7:06 AM EDT ----- Diabetes test looks good but ldl is still too high.  Would add lipitor 20 mg poqdaya nd recheck in 3 months if not already on statin.

## 2016-04-02 ENCOUNTER — Other Ambulatory Visit: Payer: Self-pay

## 2016-04-21 ENCOUNTER — Ambulatory Visit: Payer: Medicare Other | Admitting: Family Medicine

## 2016-04-22 ENCOUNTER — Encounter: Payer: Self-pay | Admitting: Family Medicine

## 2016-04-22 ENCOUNTER — Ambulatory Visit (INDEPENDENT_AMBULATORY_CARE_PROVIDER_SITE_OTHER): Payer: Medicare Other | Admitting: Family Medicine

## 2016-04-22 VITALS — BP 128/82 | HR 79 | Temp 98.0°F | Ht 73.0 in | Wt 294.0 lb

## 2016-04-22 DIAGNOSIS — Z6838 Body mass index (BMI) 38.0-38.9, adult: Secondary | ICD-10-CM

## 2016-04-22 DIAGNOSIS — E7800 Pure hypercholesterolemia, unspecified: Secondary | ICD-10-CM | POA: Diagnosis not present

## 2016-04-22 DIAGNOSIS — IMO0001 Reserved for inherently not codable concepts without codable children: Secondary | ICD-10-CM

## 2016-04-22 DIAGNOSIS — E6609 Other obesity due to excess calories: Secondary | ICD-10-CM | POA: Diagnosis not present

## 2016-04-22 DIAGNOSIS — E119 Type 2 diabetes mellitus without complications: Secondary | ICD-10-CM | POA: Diagnosis not present

## 2016-04-22 DIAGNOSIS — I10 Essential (primary) hypertension: Secondary | ICD-10-CM | POA: Diagnosis not present

## 2016-04-22 LAB — LIPID PANEL
CHOL/HDL RATIO: 4.4 ratio (ref <5.0)
CHOLESTEROL: 199 mg/dL (ref <200)
HDL: 45 mg/dL (ref >40)
LDL Cholesterol: 120 mg/dL — ABNORMAL HIGH (ref <100)
Triglycerides: 169 mg/dL — ABNORMAL HIGH (ref <150)
VLDL: 34 mg/dL — ABNORMAL HIGH (ref <30)

## 2016-04-22 LAB — CBC WITH DIFFERENTIAL/PLATELET
BASOS ABS: 0 {cells}/uL (ref 0–200)
Basophils Relative: 0 %
Eosinophils Absolute: 177 cells/uL (ref 15–500)
Eosinophils Relative: 3 %
HEMATOCRIT: 42.6 % (ref 38.5–50.0)
HEMOGLOBIN: 14.1 g/dL (ref 13.0–17.0)
LYMPHS ABS: 1947 {cells}/uL (ref 850–3900)
Lymphocytes Relative: 33 %
MCH: 29 pg (ref 27.0–33.0)
MCHC: 33.1 g/dL (ref 32.0–36.0)
MCV: 87.5 fL (ref 80.0–100.0)
MONO ABS: 531 {cells}/uL (ref 200–950)
MPV: 10.4 fL (ref 7.5–12.5)
Monocytes Relative: 9 %
NEUTROS PCT: 55 %
Neutro Abs: 3245 cells/uL (ref 1500–7800)
Platelets: 198 10*3/uL (ref 140–400)
RBC: 4.87 MIL/uL (ref 4.20–5.80)
RDW: 15 % (ref 11.0–15.0)
WBC: 5.9 10*3/uL (ref 3.8–10.8)

## 2016-04-22 LAB — COMPREHENSIVE METABOLIC PANEL
ALBUMIN: 4.1 g/dL (ref 3.6–5.1)
ALT: 17 U/L (ref 9–46)
AST: 15 U/L (ref 10–35)
Alkaline Phosphatase: 60 U/L (ref 40–115)
BILIRUBIN TOTAL: 0.7 mg/dL (ref 0.2–1.2)
BUN: 15 mg/dL (ref 7–25)
CALCIUM: 9.2 mg/dL (ref 8.6–10.3)
CHLORIDE: 102 mmol/L (ref 98–110)
CO2: 26 mmol/L (ref 20–31)
Creat: 1.31 mg/dL — ABNORMAL HIGH (ref 0.70–1.25)
Glucose, Bld: 171 mg/dL — ABNORMAL HIGH (ref 70–99)
Potassium: 3.7 mmol/L (ref 3.5–5.3)
Sodium: 140 mmol/L (ref 135–146)
Total Protein: 6.8 g/dL (ref 6.1–8.1)

## 2016-04-22 LAB — HEMOGLOBIN A1C
Hgb A1c MFr Bld: 6.5 % — ABNORMAL HIGH (ref <5.7)
MEAN PLASMA GLUCOSE: 140 mg/dL

## 2016-04-22 MED ORDER — GLIPIZIDE ER 10 MG PO TB24: 10.0000 mg | ORAL_TABLET | Freq: Two times a day (BID) | ORAL | 2 refills | Status: DC

## 2016-04-22 MED ORDER — LISINOPRIL-HYDROCHLOROTHIAZIDE 20-25 MG PO TABS: 1.0000 | ORAL_TABLET | Freq: Every day | ORAL | 2 refills | Status: DC

## 2016-04-22 NOTE — Progress Notes (Signed)
   Subjective:    Patient ID: [name] [name], male    DOB: Aug 10, 1950, 65 y.o.   MRN: [id number]  Patient presents for Follow-up Pt here to f/u Diabetes Mellitus/ HTN, hyperlipidemia   DM- blood sugars range 98-158, 150's after eating fruit the night before       Last A1C 6.6% in August  No weight change, minimal exercise Cholesterol was elevated with LDL 136  , he declined statin drug therapy  Declines Immunizations   HTN- BP 118-130's/ 60-82, no dizziness, no difficulty with meds  He is taking his supplements as prescribed No new concerns today. Because of finances he has not been able to see the eye doctor. He is also in the donut whole with regards to his Janumet   Review Of Systems:  GEN- denies fatigue, fever, weight loss,weakness, recent illness HEENT- denies eye drainage, change in vision, nasal discharge, CVS- denies chest pain, palpitations RESP- denies SOB, cough, wheeze ABD- denies N/V, change in stools, abd pain GU- denies dysuria, hematuria, dribbling, incontinence MSK- denies joint pain, muscle aches, injury Neuro- denies headache, dizziness, syncope, seizure activity       Objective:    BP 128/82 (BP Location: Left Arm, Patient Position: Sitting, Cuff Size: Large)   Pulse 79   Temp 98 F (36.7 C) (Oral)   Ht 6\' 1"  (1.854 m)   Wt 294 lb (133.4 kg)   SpO2 96%   BMI 38.79 kg/m  GEN- NAD, alert and oriented x3 HEENT- PERRL, EOMI, non injected sclera, pink conjunctiva, MMM, oropharynx clear Neck- Supple, no thyromegaly CVS- RRR, no murmur RESP-CTAB EXT- No edema Pulses- Radial, DP- 2+        Assessment & Plan:      Problem List Items Addressed This Visit    Obesity   Relevant Medications   glipiZIDE (GLUCOTROL XL) 10 MG 24 hr tablet   HYPERTENSION, BENIGN - Primary    Pressures well-controlled. Continue to reiterate the importance of dietary changes and weight loss. He declined statin drug. He is at risk for heart disease and stroke. Continue  current blood pressure medication. We'll check his fasting labs today. Hopefully his cholesterol is improving but I'm concerned as his weight has not changed.      Relevant Medications   lisinopril-hydrochlorothiazide (PRINZIDE,ZESTORETIC) 20-25 MG tablet   Other Relevant Orders   CBC with Differential/Platelet   Comprehensive metabolic panel   Hyperlipidemia   Relevant Medications   lisinopril-hydrochlorothiazide (PRINZIDE,ZESTORETIC) 20-25 MG tablet   Diabetes mellitus, type II ([place])    Diabetes has been well controlled. I've given him samples of the extended release Janumet to hold him over until the new year. Goals to keep his A1c less than 7%      Relevant Medications   glipiZIDE (GLUCOTROL XL) 10 MG 24 hr tablet   lisinopril-hydrochlorothiazide (PRINZIDE,ZESTORETIC) 20-25 MG tablet   Other Relevant Orders   Hemoglobin A1c   Lipid panel   HM DIABETES FOOT EXAM (Completed)      Note: This dictation was prepared with Dragon dictation along with smaller phrase technology. Any transcriptional errors that result from this process are unintentional.

## 2016-04-22 NOTE — Assessment & Plan Note (Signed)
Pressures well-controlled. Continue to reiterate the importance of dietary changes and weight loss. He declined statin drug. He is at risk for heart disease and stroke. Continue current blood pressure medication. We'll check his fasting labs today. Hopefully his cholesterol is improving but I'm concerned as his weight has not changed.

## 2016-04-22 NOTE — Patient Instructions (Addendum)
F/U 4 months  When you run out take the 100-1000mg  XR of the Janumet once a day

## 2016-04-22 NOTE — Assessment & Plan Note (Signed)
Diabetes has been well controlled. I've given him samples of the extended release Janumet to hold him over until the new year. Goals to keep his A1c less than 7%

## 2016-04-23 ENCOUNTER — Telehealth: Payer: Self-pay

## 2016-04-23 NOTE — Telephone Encounter (Signed)
-----   Message from Susy Frizzle, MD sent at 04/23/2016  7:20 AM EST ----- Diabetes test is good.  However, LDL is too high.  Would start lipitor 20 mg poqday and recheck FLP in 3 months.

## 2016-04-23 NOTE — Telephone Encounter (Signed)
Called patient. No answer. Will try later.  

## 2016-04-24 ENCOUNTER — Encounter: Payer: Self-pay | Admitting: *Deleted

## 2016-05-15 ENCOUNTER — Encounter (HOSPITAL_COMMUNITY): Payer: Self-pay | Admitting: Emergency Medicine

## 2016-05-15 ENCOUNTER — Emergency Department (HOSPITAL_COMMUNITY)
Admission: EM | Admit: 2016-05-15 | Discharge: 2016-05-16 | Disposition: A | Discharge status: Home / Self Care | Payer: Medicare Other | Attending: Emergency Medicine | Admitting: Emergency Medicine

## 2016-05-15 DIAGNOSIS — Z87891 Personal history of nicotine dependence: Secondary | ICD-10-CM | POA: Diagnosis not present

## 2016-05-15 DIAGNOSIS — E119 Type 2 diabetes mellitus without complications: Secondary | ICD-10-CM | POA: Insufficient documentation

## 2016-05-15 DIAGNOSIS — E876 Hypokalemia: Secondary | ICD-10-CM | POA: Insufficient documentation

## 2016-05-15 DIAGNOSIS — Z7984 Long term (current) use of oral hypoglycemic drugs: Secondary | ICD-10-CM | POA: Insufficient documentation

## 2016-05-15 DIAGNOSIS — Z79899 Other long term (current) drug therapy: Secondary | ICD-10-CM | POA: Insufficient documentation

## 2016-05-15 DIAGNOSIS — I10 Essential (primary) hypertension: Secondary | ICD-10-CM | POA: Diagnosis not present

## 2016-05-15 DIAGNOSIS — R002 Palpitations: Secondary | ICD-10-CM | POA: Diagnosis not present

## 2016-05-15 NOTE — ED Provider Notes (Signed)
[facility] DEPT Provider Note   CSN: [id number] Arrival date & time: 05/15/16  2303  By signing my name below, I, Dora Sims, attest that this documentation has been prepared under the direction and in the presence of physician practitioner, Ezequiel Essex, MD. Electronically Signed: Dora Sims, Scribe. 05/15/2016. 11:53 PM.  History   Chief Complaint Chief Complaint  Patient presents with  . Irregular Heart Beat    noticed it 2030- no previous history    The history is provided by the patient. No language interpreter was used.     HPI Comments: [name] [name] is a 66 y.o. male who presents to the Emergency Department complaining of sudden onset, constant, gradually improving, palpitations beginning a few hours ago. He states it feels like his heart is beating "funny." Patient reports he was watching TV tonight and began to feel "jittery" and tremulous; he checked his blood sugar and it was 97. He states he ate two pieces of candy and his blood sugar rose to 140; he notes he started experiencing palpitations at this time. Patient reports he feels much better currently than he did initially. He is a non-insulin dependent diabetic and uses janumet, lisinopril, and glipizide for this condition. He ate and drank normally today. No h/o heart disease, MI, or coronary stents. He denies nausea, vomiting, diarrhea, CP, SOB, dizziness, lightheadedness, weakness, numbness/tingling, vision changes, or any other associated symptoms.  PCP: Dr. Buelah Manis  Past Medical History:  Diagnosis Date  . Diabetes mellitus   . Hyperlipidemia   . Hypertension   . Renal cyst 2013   Left hemorrhagic hilar cyst    Patient Active Problem List   Diagnosis Date Noted  . Bell's palsy 11/13/2014  . Pain in joint, shoulder region 07/16/2014  . Boil of buttock 04/07/2013  . Hemorrhagic cyst 06/10/2011  . Hematuria 06/09/2011  . Diabetes mellitus, type II ([place]) 03/14/2009  . Hyperlipidemia 03/14/2009  .  Obesity 03/14/2009  . HYPERTENSION, BENIGN 03/14/2009    Past Surgical History:  Procedure Laterality Date  . COLONOSCOPY N/A 06/28/2014   Procedure: COLONOSCOPY;  Surgeon: Rogene Houston, MD;  Location: [facility];  Service: Endoscopy;  Laterality: N/A;  1030  . NASAL SINUS SURGERY         Home Medications    Prior to Admission medications   Medication Sig Start Date End Date Taking? Authorizing Provider  Cholecalciferol (VITAMIN D3) 1000 units CAPS Take 1 capsule by mouth daily.    Historical Provider, MD  Cinnamon 500 MG capsule Take 500 mg by mouth daily as needed.    Historical Provider, MD  glipiZIDE (GLUCOTROL XL) 10 MG 24 hr tablet Take 1 tablet (10 mg total) by mouth 2 (two) times daily. 04/22/16   Alycia Rossetti, MD  lisinopril-hydrochlorothiazide (PRINZIDE,ZESTORETIC) 20-25 MG tablet Take 1 tablet by mouth daily. 04/22/16   Alycia Rossetti, MD  Multiple Vitamin (MULTIVITAMIN) capsule Take 1 capsule by mouth daily.      Historical Provider, MD  Omega-3 Fatty Acids (FISH OIL) 1000 MG CAPS Take 1 capsule by mouth daily.    Historical Provider, MD  ONE TOUCH ULTRA TEST test strip TEST 1-2 TIMES DAILY 10/11/14   Historical Provider, MD  sitaGLIPtin-metformin (JANUMET) 50-500 MG tablet Take 1 tablet by mouth 2 (two) times daily with a meal. 12/20/15   Alycia Rossetti, MD  vitamin B-12 (CYANOCOBALAMIN) 1000 MCG tablet Take 1,000 mcg by mouth daily.    Historical Provider, MD    Family History History  reviewed. No pertinent family history.  Social History Social History  Substance Use Topics  . Smoking status: Former Research scientist (life sciences)  . Smokeless tobacco: Never Used  . Alcohol use No     Allergies   Aspirin and Codeine   Review of Systems Review of Systems  A complete 10 system review of systems was obtained and all systems are negative except as noted in the HPI and PMH.   Physical Exam Updated Vital Signs BP 102/66 (BP Location: Left Arm)   Pulse 87   Temp 97.7 F  (36.5 C) (Oral)   Resp 16   Ht 6\' 1"  (1.854 m)   Wt 290 lb (131.5 kg)   SpO2 99%   BMI 38.26 kg/m   Physical Exam  Constitutional: He is oriented to person, place, and time. He appears well-developed and well-nourished. No distress.  Appears well, no distress.  HENT:  Head: Normocephalic and atraumatic.  Mouth/Throat: Oropharynx is clear and moist. No oropharyngeal exudate.  Eyes: Conjunctivae and EOM are normal. Pupils are equal, round, and reactive to light.  Neck: Normal range of motion. Neck supple.  No meningismus.  Cardiovascular: Normal rate, regular rhythm, normal heart sounds and intact distal pulses.   No murmur heard. Occasional PVC's on monitor.  Pulmonary/Chest: Effort normal and breath sounds normal. No respiratory distress.  Abdominal: Soft. There is no tenderness. There is no rebound and no guarding.  Musculoskeletal: Normal range of motion. He exhibits no edema or tenderness.  Neurological: He is alert and oriented to person, place, and time. No cranial nerve deficit. He exhibits normal muscle tone. Coordination normal.   5/5 strength throughout. CN 2-12 intact.Equal grip strength.   Skin: Skin is warm.  Psychiatric: He has a normal mood and affect. His behavior is normal.  Nursing note and vitals reviewed.    ED Treatments / Results  Labs (all labs ordered are listed, but only abnormal results are displayed) Labs Reviewed  COMPREHENSIVE METABOLIC PANEL - Abnormal; Notable for the following:       Result Value   Sodium 133 (*)    Potassium 3.2 (*)    Chloride 98 (*)    Glucose, Bld 129 (*)    BUN 21 (*)    Creatinine, Ser 1.37 (*)    Calcium 8.8 (*)    GFR calc non Af Amer 52 (*)    All other components within normal limits  CBC WITH DIFFERENTIAL/PLATELET  TROPONIN I    EKG  EKG Interpretation  Date/Time:  Friday May 15 2016 23:21:29 EST Ventricular Rate:  81 PR Interval:    QRS Duration: 86 QT Interval:  375 QTC Calculation: 436 R  Axis:   -13 Text Interpretation:  Sinus rhythm Ventricular premature complex Low voltage, precordial leads No significant change was found Confirmed by Benjamin [phone]) on 05/15/2016 11:33:57 PM       Radiology Dg Chest 2 View  Result Date: 05/16/2016 CLINICAL DATA:  66 y/o  M; palpitations. EXAM: CHEST  2 VIEW COMPARISON:  02/25/2010 chest radiograph. FINDINGS: The heart size and mediastinal contours are within normal limits and stable. Both lungs are clear. Mild multilevel degenerative changes of the thoracic spine. IMPRESSION: No active cardiopulmonary disease. Electronically Signed   By: Kristine Garbe M.D.   On: 05/16/2016 01:21    Procedures Procedures (including critical care time)  DIAGNOSTIC STUDIES: Oxygen Saturation is 99% on RA, normal by my interpretation.    COORDINATION OF CARE: 12:00 AM Discussed treatment plan with  pt at bedside and pt agreed to plan.  Medications Ordered in ED Medications - No data to display   Initial Impression / Assessment and Plan / ED Course  I have reviewed the triage vital signs and the nursing notes.  Pertinent labs & imaging results that were available during my care of the patient were reviewed by me and considered in my medical decision making (see chart for details).  Patient with episode of "jitteriness" and palpitations. Now improving. Denies chest pain or shortness of breath. EKG normal sinus rhythm with occasional PVCs.    Labs with mild hypokalemia which was repleted. CBG 129  Patient will be given cardiology follow-up for Holter monitoring as needed. He is anxious to be discharged and states he feels back to baseline. Denies any chest pain, shortness of breath or palpitations. Denies any dizziness or lightheaded feeling. Return precautions discussed.  Final Clinical Impressions(s) / ED Diagnoses   Final diagnoses:  Palpitations  Hypokalemia    New Prescriptions New Prescriptions   No medications  on file  I personally performed the services described in this documentation, which was scribed in my presence. The recorded information has been reviewed and is accurate.    Ezequiel Essex, MD 05/16/16 [phone]

## 2016-05-15 NOTE — ED Triage Notes (Signed)
Watching TV when noticed that his heart felt like it was skipping beats, or as he reports it might be gas

## 2016-05-16 ENCOUNTER — Emergency Department (HOSPITAL_COMMUNITY): Payer: Medicare Other

## 2016-05-16 DIAGNOSIS — R002 Palpitations: Secondary | ICD-10-CM | POA: Diagnosis not present

## 2016-05-16 DIAGNOSIS — E876 Hypokalemia: Secondary | ICD-10-CM | POA: Diagnosis not present

## 2016-05-16 LAB — CBC WITH DIFFERENTIAL/PLATELET
BASOS PCT: 0 %
Basophils Absolute: 0 10*3/uL (ref 0.0–0.1)
EOS ABS: 0.2 10*3/uL (ref 0.0–0.7)
EOS PCT: 4 %
HCT: 40.1 % (ref 39.0–52.0)
HEMOGLOBIN: 13.5 g/dL (ref 13.0–17.0)
Lymphocytes Relative: 34 %
Lymphs Abs: 2 10*3/uL (ref 0.7–4.0)
MCH: 29.1 pg (ref 26.0–34.0)
MCHC: 33.7 g/dL (ref 30.0–36.0)
MCV: 86.4 fL (ref 78.0–100.0)
Monocytes Absolute: 0.6 10*3/uL (ref 0.1–1.0)
Monocytes Relative: 10 %
NEUTROS PCT: 52 %
Neutro Abs: 3.1 10*3/uL (ref 1.7–7.7)
PLATELETS: 198 10*3/uL (ref 150–400)
RBC: 4.64 MIL/uL (ref 4.22–5.81)
RDW: 13.8 % (ref 11.5–15.5)
WBC: 6 10*3/uL (ref 4.0–10.5)

## 2016-05-16 LAB — COMPREHENSIVE METABOLIC PANEL
ALBUMIN: 3.6 g/dL (ref 3.5–5.0)
ALK PHOS: 49 U/L (ref 38–126)
ALT: 19 U/L (ref 17–63)
ANION GAP: 10 (ref 5–15)
AST: 19 U/L (ref 15–41)
BUN: 21 mg/dL — ABNORMAL HIGH (ref 6–20)
CALCIUM: 8.8 mg/dL — AB (ref 8.9–10.3)
CHLORIDE: 98 mmol/L — AB (ref 101–111)
CO2: 25 mmol/L (ref 22–32)
CREATININE: 1.37 mg/dL — AB (ref 0.61–1.24)
GFR calc non Af Amer: 52 mL/min — ABNORMAL LOW (ref >60)
GLUCOSE: 129 mg/dL — AB (ref 65–99)
Potassium: 3.2 mmol/L — ABNORMAL LOW (ref 3.5–5.1)
Sodium: 133 mmol/L — ABNORMAL LOW (ref 135–145)
Total Bilirubin: 0.6 mg/dL (ref 0.3–1.2)
Total Protein: 6.6 g/dL (ref 6.5–8.1)

## 2016-05-16 LAB — TROPONIN I: Troponin I: <0.03 ng/mL (ref <0.03)

## 2016-05-16 MED ORDER — POTASSIUM CHLORIDE CRYS ER 20 MEQ PO TBCR
40.0000 meq | EXTENDED_RELEASE_TABLET | Freq: Once | ORAL | Status: AC
  Administered 2016-05-16: 40 meq via ORAL
  Filled 2016-05-16: qty 2

## 2016-05-16 NOTE — Discharge Instructions (Signed)
Follow up with your primary doctor and the cardiologist. Return to the ED if you develop new or worsening symptoms.

## 2016-05-17 ENCOUNTER — Other Ambulatory Visit: Payer: Self-pay | Admitting: Family Medicine

## 2016-05-17 DIAGNOSIS — I493 Ventricular premature depolarization: Secondary | ICD-10-CM

## 2016-05-17 DIAGNOSIS — R002 Palpitations: Secondary | ICD-10-CM

## 2016-05-17 NOTE — Progress Notes (Signed)
Call pt saw he was in ED With palpitations, EKG showed PVC which are fairly benign but since he had the DM, recommend get a monitor placed to see if we are missing any other arrthymia. Referral placed to cardiology   If this occurs again before that appt, come in for visit

## 2016-06-08 ENCOUNTER — Encounter (HOSPITAL_COMMUNITY): Payer: Self-pay

## 2016-06-08 ENCOUNTER — Emergency Department (HOSPITAL_COMMUNITY)
Admission: EM | Admit: 2016-06-08 | Discharge: 2016-06-08 | Disposition: A | Discharge status: Home / Self Care | Payer: Medicare Other | Attending: Emergency Medicine | Admitting: Emergency Medicine

## 2016-06-08 DIAGNOSIS — R112 Nausea with vomiting, unspecified: Secondary | ICD-10-CM | POA: Diagnosis not present

## 2016-06-08 DIAGNOSIS — E119 Type 2 diabetes mellitus without complications: Secondary | ICD-10-CM | POA: Insufficient documentation

## 2016-06-08 DIAGNOSIS — Z7984 Long term (current) use of oral hypoglycemic drugs: Secondary | ICD-10-CM | POA: Insufficient documentation

## 2016-06-08 DIAGNOSIS — Z87891 Personal history of nicotine dependence: Secondary | ICD-10-CM | POA: Diagnosis not present

## 2016-06-08 DIAGNOSIS — I10 Essential (primary) hypertension: Secondary | ICD-10-CM | POA: Insufficient documentation

## 2016-06-08 DIAGNOSIS — Z79899 Other long term (current) drug therapy: Secondary | ICD-10-CM | POA: Insufficient documentation

## 2016-06-08 DIAGNOSIS — R197 Diarrhea, unspecified: Secondary | ICD-10-CM | POA: Insufficient documentation

## 2016-06-08 LAB — URINALYSIS, ROUTINE W REFLEX MICROSCOPIC
Glucose, UA: 500 mg/dL — AB
Ketones, ur: 15 mg/dL — AB
Leukocytes, UA: NEGATIVE
Nitrite: NEGATIVE
Protein, ur: 30 mg/dL — AB
Specific Gravity, Urine: >1.03 — ABNORMAL HIGH (ref 1.005–1.030)
pH: 6 (ref 5.0–8.0)

## 2016-06-08 LAB — COMPREHENSIVE METABOLIC PANEL WITH GFR
ALT: 26 U/L (ref 17–63)
AST: 21 U/L (ref 15–41)
Albumin: 4.1 g/dL (ref 3.5–5.0)
Alkaline Phosphatase: 48 U/L (ref 38–126)
Anion gap: 12 (ref 5–15)
BUN: 22 mg/dL — ABNORMAL HIGH (ref 6–20)
CO2: 25 mmol/L (ref 22–32)
Calcium: 9.3 mg/dL (ref 8.9–10.3)
Chloride: 100 mmol/L — ABNORMAL LOW (ref 101–111)
Creatinine, Ser: 1.39 mg/dL — ABNORMAL HIGH (ref 0.61–1.24)
GFR calc Af Amer: 59 mL/min — ABNORMAL LOW
GFR calc non Af Amer: 51 mL/min — ABNORMAL LOW
Glucose, Bld: 254 mg/dL — ABNORMAL HIGH (ref 65–99)
Potassium: 4.2 mmol/L (ref 3.5–5.1)
Sodium: 137 mmol/L (ref 135–145)
Total Bilirubin: 1.4 mg/dL — ABNORMAL HIGH (ref 0.3–1.2)
Total Protein: 7.8 g/dL (ref 6.5–8.1)

## 2016-06-08 LAB — URINALYSIS, MICROSCOPIC (REFLEX)
RBC / HPF: NONE SEEN RBC/hpf (ref 0–5)
Squamous Epithelial / LPF: NONE SEEN
WBC UA: NONE SEEN WBC/hpf (ref 0–5)

## 2016-06-08 LAB — LIPASE, BLOOD: Lipase: 22 U/L (ref 11–51)

## 2016-06-08 LAB — CBC
HCT: 45.2 % (ref 39.0–52.0)
Hemoglobin: 15 g/dL (ref 13.0–17.0)
MCH: 28.6 pg (ref 26.0–34.0)
MCHC: 33.2 g/dL (ref 30.0–36.0)
MCV: 86.3 fL (ref 78.0–100.0)
Platelets: 183 K/uL (ref 150–400)
RBC: 5.24 MIL/uL (ref 4.22–5.81)
RDW: 13.9 % (ref 11.5–15.5)
WBC: 7.3 K/uL (ref 4.0–10.5)

## 2016-06-08 LAB — CBG MONITORING, ED: Glucose-Capillary: 234 mg/dL — ABNORMAL HIGH (ref 65–99)

## 2016-06-08 MED ORDER — ONDANSETRON HCL 4 MG PO TABS: 4.0000 mg | ORAL_TABLET | Freq: Four times a day (QID) | ORAL | 0 refills | Status: DC

## 2016-06-08 MED ORDER — ONDANSETRON HCL 4 MG/2ML IJ SOLN: 2.0000 mg | Freq: Once | INTRAMUSCULAR | Status: DC

## 2016-06-08 MED ORDER — ONDANSETRON 4 MG PO TBDP
4.0000 mg | ORAL_TABLET | Freq: Once | ORAL | Status: AC
  Administered 2016-06-08: 4 mg via ORAL
  Filled 2016-06-08: qty 1

## 2016-06-08 NOTE — ED Provider Notes (Signed)
Emergency Department Provider Note   I have reviewed the triage vital signs and the nursing notes.   HISTORY  Chief Complaint Emesis   HPI [name] [name] is a 66 y.o. male with PMH of DM, HTN, HLD presents to the emergency department for evaluation of intractable nausea, vomiting, diarrhea that started this morning. He denies other sick contacts. He had some mild upper abdominal cramping this morning that was nonradiating. Describes it as mild to moderate in severity with no modifying factors. He began having diarrhea and shortly after was having both diarrhea and vomiting. No fevers or shaking chills. Denies chest pain or difficulty breathing. He was unable to control his symptoms and so came to the emergency department.    Past Medical History:  Diagnosis Date  . Diabetes mellitus   . Hyperlipidemia   . Hypertension   . Renal cyst 2013   Left hemorrhagic hilar cyst    Patient Active Problem List   Diagnosis Date Noted  . Bell's palsy 11/13/2014  . Pain in joint, shoulder region 07/16/2014  . Boil of buttock 04/07/2013  . Hemorrhagic cyst 06/10/2011  . Hematuria 06/09/2011  . Diabetes mellitus, type II ([place]) 03/14/2009  . Hyperlipidemia 03/14/2009  . Obesity 03/14/2009  . HYPERTENSION, BENIGN 03/14/2009    Past Surgical History:  Procedure Laterality Date  . COLONOSCOPY N/A 06/28/2014   Procedure: COLONOSCOPY;  Surgeon: Rogene Houston, MD;  Location: [facility];  Service: Endoscopy;  Laterality: N/A;  1030  . NASAL SINUS SURGERY      Current Outpatient Rx  . Order #: [id number] Class: Historical Med  . Order #: [id number] Class: Historical Med  . Order #: [id number] Class: Normal  . Order #: [id number] Class: Normal  . Order #: [id number] Class: Historical Med  . Order #: [id number] Class: Historical Med  . Order #: [id number] Class: Print  . Order #: [id number] Class: Historical Med  . Order #: [id number] Class: Normal  . Order #: [id number] Class: Historical Med     Allergies Aspirin and Codeine  No family history on file.  Social History Social History  Substance Use Topics  . Smoking status: Former Research scientist (life sciences)  . Smokeless tobacco: Never Used  . Alcohol use No    Review of Systems  Constitutional: No fever/chills Eyes: No visual changes. ENT: No sore throat. Cardiovascular: Denies chest pain. Respiratory: Denies shortness of breath. Gastrointestinal: No abdominal pain. Positive nausea, vomiting, and diarrhea.  No constipation. Genitourinary: Negative for dysuria. Musculoskeletal: Negative for back pain. Skin: Negative for rash. Neurological: Negative for headaches, focal weakness or numbness.  10-point ROS otherwise negative.  ____________________________________________   PHYSICAL EXAM:  VITAL SIGNS: ED Triage Vitals  Enc Vitals Group     BP 06/08/16 1336 147/80     Pulse Rate 06/08/16 1336 116     Resp 06/08/16 1336 17     Temp 06/08/16 1336 98.6 F (37 C)     Temp Source 06/08/16 1336 Oral     SpO2 06/08/16 1336 100 %     Weight 06/08/16 1337 290 lb (131.5 kg)     Height 06/08/16 1337 6\' 1"  (1.854 m)     Pain Score 06/08/16 1623 0   Constitutional: Alert and oriented. Well appearing and in no acute distress. Eyes: Conjunctivae are normal. Head: Atraumatic. Nose: No congestion/rhinnorhea. Mouth/Throat: Mucous membranes are moist.  Neck: No stridor.  Cardiovascular: Normal rate, regular rhythm. Good peripheral circulation. Grossly normal heart sounds.   Respiratory: Normal respiratory effort.  No retractions. Lungs CTAB. Gastrointestinal: Soft and nontender.  No distention.  Musculoskeletal: No lower extremity tenderness nor edema. No gross deformities of extremities. Neurologic:  Normal speech and language. No gross focal neurologic deficits are appreciated.  Skin:  Skin is warm, dry and intact. No rash noted. Psychiatric: Mood and affect are normal. Speech and behavior are  normal.  ____________________________________________   LABS (all labs ordered are listed, but only abnormal results are displayed)  Labs Reviewed  COMPREHENSIVE METABOLIC PANEL - Abnormal; Notable for the following:       Result Value   Chloride 100 (*)    Glucose, Bld 254 (*)    BUN 22 (*)    Creatinine, Ser 1.39 (*)    Total Bilirubin 1.4 (*)    GFR calc non Af Amer 51 (*)    GFR calc Af Amer 59 (*)    All other components within normal limits  URINALYSIS, ROUTINE W REFLEX MICROSCOPIC - Abnormal; Notable for the following:    APPearance TURBID (*)    Specific Gravity, Urine >1.030 (*)    Glucose, UA 500 (*)    Hgb urine dipstick SMALL (*)    Bilirubin Urine SMALL (*)    Ketones, ur 15 (*)    Protein, ur 30 (*)    All other components within normal limits  URINALYSIS, MICROSCOPIC (REFLEX) - Abnormal; Notable for the following:    Bacteria, UA FEW (*)    All other components within normal limits  CBG MONITORING, ED - Abnormal; Notable for the following:    Glucose-Capillary 234 (*)    All other components within normal limits  LIPASE, BLOOD  CBC   ____________________________________________  RADIOLOGY  None ____________________________________________   PROCEDURES  Procedure(s) performed:   Procedures  None ____________________________________________   INITIAL IMPRESSION / ASSESSMENT AND PLAN / ED COURSE  Pertinent labs & imaging results that were available during my care of the patient were reviewed by me and considered in my medical decision making (see chart for details).  Patient resents to the emergency room for evaluation of intractable nausea, vomiting, diarrhea. No symptoms since arrival to the emergency department. His abdomen is soft and nontender. Vital signs are within normal limits with the exception of some mild tachycardia. The patient's labs are unremarkable with baseline kidney function. Offered IV fluids but patient is feeling somewhat  better and would like to try PO Zofran and PO challenge. No clear indication for further imaging or workup. The patient was given Zofran and drink multiple cups of water. He was sitting up on the edge of the bed and states he is ready to go because he is feeling much better and is very hungry. The wife states it like to be discharged to go and eat. Discussed return precautions in detail.  At this time, I do not feel there is any life-threatening condition present. I have reviewed and discussed all results (EKG, imaging, lab, urine as appropriate), exam findings with patient. I have reviewed nursing notes and appropriate previous records.  I feel the patient is safe to be discharged home without further emergent workup. Discussed usual and customary return precautions. Patient and family (if present) verbalize understanding and are comfortable with this plan.  Patient will follow-up with their primary care provider. If they do not have a primary care provider, information for follow-up has been provided to them. All questions have been answered.  ____________________________________________  FINAL CLINICAL IMPRESSION(S) / ED DIAGNOSES  Final diagnoses:  Non-intractable vomiting with nausea, unspecified vomiting type     MEDICATIONS  GIVEN DURING THIS VISIT:  Medications  ondansetron (ZOFRAN-ODT) disintegrating tablet 4 mg (4 mg Oral Given 06/08/16 1622)     NEW OUTPATIENT MEDICATIONS STARTED DURING THIS VISIT:  New Prescriptions   ONDANSETRON (ZOFRAN) 4 MG TABLET    Take 1 tablet (4 mg total) by mouth every 6 (six) hours.      Note:  This document was prepared using Dragon voice recognition software and may include unintentional dictation errors.  Nanda Quinton, MD Emergency Medicine   Margette Fast, MD 06/08/16 [phone]

## 2016-06-08 NOTE — Discharge Instructions (Signed)

## 2016-06-08 NOTE — ED Triage Notes (Addendum)
Nausea, vomiting and diarrhea that started this morning. Reports of chills although denies fevers. Patient states he feels "fine", just unable to keep any foods down.

## 2016-08-21 ENCOUNTER — Encounter: Payer: Self-pay | Admitting: Family Medicine

## 2016-08-21 ENCOUNTER — Ambulatory Visit (INDEPENDENT_AMBULATORY_CARE_PROVIDER_SITE_OTHER): Payer: Medicare Other | Admitting: Family Medicine

## 2016-08-21 VITALS — BP 124/76 | HR 80 | Temp 97.9°F | Resp 14 | Ht 73.0 in | Wt 287.0 lb

## 2016-08-21 DIAGNOSIS — I10 Essential (primary) hypertension: Secondary | ICD-10-CM

## 2016-08-21 DIAGNOSIS — E119 Type 2 diabetes mellitus without complications: Secondary | ICD-10-CM

## 2016-08-21 DIAGNOSIS — E7800 Pure hypercholesterolemia, unspecified: Secondary | ICD-10-CM

## 2016-08-21 DIAGNOSIS — Z6837 Body mass index (BMI) 37.0-37.9, adult: Secondary | ICD-10-CM

## 2016-08-21 LAB — CBC WITH DIFFERENTIAL/PLATELET
Basophils Absolute: 0 cells/uL (ref 0–200)
Basophils Relative: 0 %
Eosinophils Absolute: 150 cells/uL (ref 15–500)
Eosinophils Relative: 3 %
HEMATOCRIT: 43.7 % (ref 38.5–50.0)
HEMOGLOBIN: 14.4 g/dL (ref 13.0–17.0)
LYMPHS ABS: 1950 {cells}/uL (ref 850–3900)
Lymphocytes Relative: 39 %
MCH: 28.7 pg (ref 27.0–33.0)
MCHC: 33 g/dL (ref 32.0–36.0)
MCV: 87.2 fL (ref 80.0–100.0)
MONO ABS: 450 {cells}/uL (ref 200–950)
MPV: 10.5 fL (ref 7.5–12.5)
Monocytes Relative: 9 %
NEUTROS ABS: 2450 {cells}/uL (ref 1500–7800)
Neutrophils Relative %: 49 %
Platelets: 218 10*3/uL (ref 140–400)
RBC: 5.01 MIL/uL (ref 4.20–5.80)
RDW: 15.3 % — ABNORMAL HIGH (ref 11.0–15.0)
WBC: 5 10*3/uL (ref 3.8–10.8)

## 2016-08-21 LAB — COMPREHENSIVE METABOLIC PANEL
ALBUMIN: 4.2 g/dL (ref 3.6–5.1)
ALT: 24 U/L (ref 9–46)
AST: 22 U/L (ref 10–35)
Alkaline Phosphatase: 50 U/L (ref 40–115)
BUN: 15 mg/dL (ref 7–25)
CHLORIDE: 101 mmol/L (ref 98–110)
CO2: 25 mmol/L (ref 20–31)
Calcium: 9.8 mg/dL (ref 8.6–10.3)
Creat: 1.33 mg/dL — ABNORMAL HIGH (ref 0.70–1.25)
Glucose, Bld: 141 mg/dL — ABNORMAL HIGH (ref 70–99)
POTASSIUM: 4.3 mmol/L (ref 3.5–5.3)
Sodium: 140 mmol/L (ref 135–146)
TOTAL PROTEIN: 7.3 g/dL (ref 6.1–8.1)
Total Bilirubin: 1 mg/dL (ref 0.2–1.2)

## 2016-08-21 LAB — LIPID PANEL
CHOL/HDL RATIO: 4.5 ratio (ref <5.0)
Cholesterol: 189 mg/dL (ref <200)
HDL: 42 mg/dL (ref >40)
LDL Cholesterol: 114 mg/dL — ABNORMAL HIGH (ref <100)
Triglycerides: 166 mg/dL — ABNORMAL HIGH (ref <150)
VLDL: 33 mg/dL — ABNORMAL HIGH (ref <30)

## 2016-08-21 NOTE — Assessment & Plan Note (Addendum)
Diabetes has been well controlled, if A1C still less than 7% will cut glipizide down to 10mg  once a day  Continue Janumet Urine micro next visit  Due for eye exam

## 2016-08-21 NOTE — Patient Instructions (Signed)
F/U 4 months for Physical  

## 2016-08-21 NOTE — Progress Notes (Signed)
   Subjective:    Patient ID: [name] [name], male    DOB: October 09, 1950, 66 y.o.   MRN: [id number]  Patient presents for 4 month F/U (is fasting)  Pt here to f/u chronic medical problems  HTN- taking meds as prescribed  DM- last A1C  6.5% , weight down 7lbs since Dec , CBG range 100-140 fasting , no hypoglycemia  taking Janumet and Glipizide as prescribed   Hyperliidemia- started on lipitor with LDL of 120 4 months ago , no side effects with medication He is walking and trying to stay active   No concerns today       Review Of Systems:  GEN- denies fatigue, fever, weight loss,weakness, recent illness HEENT- denies eye drainage, change in vision, nasal discharge, CVS- denies chest pain, palpitations RESP- denies SOB, cough, wheeze ABD- denies N/V, change in stools, abd pain GU- denies dysuria, hematuria, dribbling, incontinence MSK- denies joint pain, muscle aches, injury Neuro- denies headache, dizziness, syncope, seizure activity       Objective:    BP 124/76   Pulse 80   Temp 97.9 F (36.6 C) (Oral)   Resp 14   Ht 6\' 1"  (1.854 m)   Wt 287 lb (130.2 kg)   SpO2 97%   BMI 37.87 kg/m  GEN- NAD, alert and oriented x3,obese  HEENT- PERRL, EOMI, non injected sclera, pink conjunctiva, MMM, oropharynx clear CVS- RRR, no murmur RESP-CTAB EXT- No edema Pulses- Radial, DP- 2+        Assessment & Plan:      Problem List Items Addressed This Visit    Obesity   HYPERTENSION, BENIGN    Controlled on LISINOPRIL hctz CHECK Lipids and renal function  Continue to work on dietary changes, exercise, would like to see him down 30-40lbs      Hyperlipidemia - Primary   Relevant Orders   Lipid panel   Diabetes mellitus, type II ([place])    Diabetes has been well controlled, if A1C still less than 7% will cut glipizide down to 10mg  once a day  Continue Janumet Urine micro next visit       Relevant Orders   Comprehensive metabolic panel   CBC with Differential/Platelet   Hemoglobin A1c      Note: This dictation was prepared with Dragon dictation along with smaller phrase technology. Any transcriptional errors that result from this process are unintentional.

## 2016-08-21 NOTE — Assessment & Plan Note (Signed)
Controlled on LISINOPRIL hctz CHECK Lipids and renal function  Continue to work on dietary changes, exercise, would like to see him down 30-40lbs

## 2016-08-22 LAB — HEMOGLOBIN A1C
Hgb A1c MFr Bld: 6.5 % — ABNORMAL HIGH (ref <5.7)
MEAN PLASMA GLUCOSE: 140 mg/dL

## 2016-08-27 ENCOUNTER — Other Ambulatory Visit: Payer: Self-pay | Admitting: *Deleted

## 2016-08-27 MED ORDER — GLIPIZIDE ER 10 MG PO TB24
10.0000 mg | ORAL_TABLET | Freq: Every day | ORAL | 2 refills | Status: DC
Start: 2016-08-27 — End: 2017-06-17

## 2016-10-16 ENCOUNTER — Other Ambulatory Visit: Payer: Self-pay | Admitting: Family Medicine

## 2016-12-11 ENCOUNTER — Telehealth: Payer: Self-pay | Admitting: Family Medicine

## 2016-12-11 NOTE — Telephone Encounter (Signed)
New Message   voiced calling they sent over a merk application on [phone] and they have not heard back and calling to check the status.

## 2016-12-14 NOTE — Telephone Encounter (Signed)
This application has been mailed multiple times. Unfortunately, mail from our office can take up to 10 business days to reach local destination, and this is going to [place]. Will mail completed copy to patient as well.

## 2016-12-23 ENCOUNTER — Encounter: Payer: Self-pay | Admitting: Family Medicine

## 2016-12-23 ENCOUNTER — Ambulatory Visit (INDEPENDENT_AMBULATORY_CARE_PROVIDER_SITE_OTHER): Payer: Medicare Other | Admitting: Family Medicine

## 2016-12-23 VITALS — BP 126/72 | HR 82 | Temp 98.0°F | Resp 16 | Ht 73.0 in | Wt 288.0 lb

## 2016-12-23 DIAGNOSIS — E7800 Pure hypercholesterolemia, unspecified: Secondary | ICD-10-CM | POA: Diagnosis not present

## 2016-12-23 DIAGNOSIS — Z125 Encounter for screening for malignant neoplasm of prostate: Secondary | ICD-10-CM | POA: Diagnosis not present

## 2016-12-23 DIAGNOSIS — Z6837 Body mass index (BMI) 37.0-37.9, adult: Secondary | ICD-10-CM | POA: Diagnosis not present

## 2016-12-23 DIAGNOSIS — I10 Essential (primary) hypertension: Secondary | ICD-10-CM | POA: Diagnosis not present

## 2016-12-23 DIAGNOSIS — E119 Type 2 diabetes mellitus without complications: Secondary | ICD-10-CM | POA: Diagnosis not present

## 2016-12-23 DIAGNOSIS — Z0000 Encounter for general adult medical examination without abnormal findings: Secondary | ICD-10-CM | POA: Diagnosis not present

## 2016-12-23 LAB — CBC WITH DIFFERENTIAL/PLATELET
BASOS PCT: 0 %
Basophils Absolute: 0 cells/uL (ref 0–200)
EOS ABS: 156 {cells}/uL (ref 15–500)
EOS PCT: 3 %
HCT: 42.1 % (ref 38.5–50.0)
Hemoglobin: 13.9 g/dL (ref 13.0–17.0)
LYMPHS ABS: 1560 {cells}/uL (ref 850–3900)
LYMPHS PCT: 30 %
MCH: 28.5 pg (ref 27.0–33.0)
MCHC: 33 g/dL (ref 32.0–36.0)
MCV: 86.3 fL (ref 80.0–100.0)
MONO ABS: 520 {cells}/uL (ref 200–950)
MONOS PCT: 10 %
MPV: 10.6 fL (ref 7.5–12.5)
NEUTROS ABS: 2964 {cells}/uL (ref 1500–7800)
Neutrophils Relative %: 57 %
PLATELETS: 218 10*3/uL (ref 140–400)
RBC: 4.88 MIL/uL (ref 4.20–5.80)
RDW: 14.5 % (ref 11.0–15.0)
WBC: 5.2 10*3/uL (ref 3.8–10.8)

## 2016-12-23 NOTE — Progress Notes (Signed)
Subjective:   Patient presents for Medicare Annual/Subsequent preventive examination.    DM- last A1C  6.5% Glucose has ranged from 112-140 if he has eaten for. He has not had any hypoglycemic episodes. Breast  Hypertension blood pressure runs 120 to 1:30 over 70s to 80 no difficulties with medication.   Review Past Medical/Family/Social: Per EMR    Risk Factors  Current exercise habits: Yes Dietary issues discussed: YES  Cardiac risk factors: Obesity (BMI >= 30 kg/m2). DM, HTN, Hyperlipidemia   Depression Screen  (Note: if answer to either of the following is "Yes", a more complete depression screening is indicated)  Over the past two weeks, have you felt down, depressed or hopeless? No Over the past two weeks, have you felt little interest or pleasure in doing things? No Have you lost interest or pleasure in daily life? No Do you often feel hopeless? No Do you cry easily over simple problems? No   Activities of Daily Living  In your present state of health, do you have any difficulty performing the following activities?:  Driving? No  Managing money? No  Feeding yourself? No  Getting from bed to chair? No  Climbing a flight of stairs? No  Preparing food and eating?: No  Bathing or showering? No  Getting dressed: No  Getting to the toilet? No  Using the toilet:No  Moving around from place to place: No  In the past year have you fallen or had a near fall?:No  Are you sexually active? Yes Do you have more than one partner? No   Hearing Difficulties: No  Do you often ask people to speak up or repeat themselves? No  Do you experience ringing or noises in your ears? No Do you have difficulty understanding soft or whispered voices? No  Do you feel that you have a problem with memory? No Do you often misplace items? No  Do you feel safe at home? Yes  Cognitive Testing  Alert? Yes Normal Appearance?Yes  Oriented to person? Yes Place? Yes  Time? Yes  Recall of three  objects? Yes  Can perform simple calculations? Yes  Displays appropriate judgment?Yes  Can read the correct time from a watch face?Yes   List the Names of Other Physician/Practitioners you currently use:  None  Screening Tests / Date Colonoscopy    UTD                 Zostavax  Declines  Pneumonia- Declines  Tetanus/tdap UTD  ROS: GEN- denies fatigue, fever, weight loss,weakness, recent illness HEENT- denies eye drainage, change in vision, nasal discharge, CVS- denies chest pain, palpitations RESP- denies SOB, cough, wheeze ABD- denies N/V, change in stools, abd pain GU- denies dysuria, hematuria, dribbling, incontinence MSK- denies joint pain, muscle aches, injury Neuro- denies headache, dizziness, syncope, seizure activity  PHYSICAL: GEN- NAD, alert and oriented x3,obese  HEENT- PERRL, EOMI, non injected sclera, pink conjunctiva, MMM, oropharynx clear, TM clear bilat no effusion  Neck- Supple, no thryomegaly CVS- RRR, no murmur RESP-CTAB ABD-NABS,soft,NT,ND EXT- No edema Pulses- Radial, DP- 2+     Assessment:    Annual wellness medicare exam   Plan:    During the course of the visit the patient was educated and counseled about appropriate screening and preventive services including:  He declines immunizations Declines hepatitis C screening  Otherwise prevention up-to-date Diabetes mellitus has been well controlled continue to work on dietary changes and weight loss as he does have hyperlipidemia but declines taking high-dose statin  drug. We'll recheck his level today. Also discussed eye exam which he is getting scheduled for the winter if his finances allow.  Hypertension blood pressure well controlled medication medication  PSA screening done   Screen Neg  for depression.   Diet review for nutrition referral? Yes ____ Not Indicated __x__  Patient Instructions (the written plan) was given to the patient.  Medicare Attestation  I have personally reviewed:   The patient's medical and social history  Their use of alcohol, tobacco or illicit drugs  Their current medications and supplements  The patient's functional ability including ADLs,fall risks, home safety risks, cognitive, and hearing and visual impairment  Diet and physical activities  Evidence for depression or mood disorders  The patient's weight, height, BMI, and visual acuity have been recorded in the chart. I have made referrals, counseling, and provided education to the patient based on review of the above and I have provided the patient with a written personalized care plan for preventive services.

## 2016-12-23 NOTE — Patient Instructions (Signed)
F/U 4 months Schedule eye visit

## 2016-12-24 LAB — COMPREHENSIVE METABOLIC PANEL
ALT: 17 U/L (ref 9–46)
AST: 15 U/L (ref 10–35)
Albumin: 4.3 g/dL (ref 3.6–5.1)
Alkaline Phosphatase: 55 U/L (ref 40–115)
BUN: 19 mg/dL (ref 7–25)
CO2: 22 mmol/L (ref 20–32)
CREATININE: 1.31 mg/dL — AB (ref 0.70–1.25)
Calcium: 9.1 mg/dL (ref 8.6–10.3)
Chloride: 103 mmol/L (ref 98–110)
GLUCOSE: 128 mg/dL — AB (ref 70–99)
POTASSIUM: 4 mmol/L (ref 3.5–5.3)
SODIUM: 140 mmol/L (ref 135–146)
TOTAL PROTEIN: 7.1 g/dL (ref 6.1–8.1)
Total Bilirubin: 0.6 mg/dL (ref 0.2–1.2)

## 2016-12-24 LAB — LIPID PANEL
CHOL/HDL RATIO: 4.1 ratio (ref <5.0)
Cholesterol: 207 mg/dL — ABNORMAL HIGH (ref <200)
HDL: 50 mg/dL (ref >40)
LDL CALC: 131 mg/dL — AB (ref <100)
TRIGLYCERIDES: 132 mg/dL (ref <150)
VLDL: 26 mg/dL (ref <30)

## 2016-12-24 LAB — HEMOGLOBIN A1C
Hgb A1c MFr Bld: 6.8 % — ABNORMAL HIGH (ref <5.7)
Mean Plasma Glucose: 148 mg/dL

## 2016-12-24 LAB — PSA: PSA: 0.7 ng/mL (ref <=4.0)

## 2016-12-29 ENCOUNTER — Encounter: Payer: Self-pay | Admitting: *Deleted

## 2017-02-18 ENCOUNTER — Other Ambulatory Visit: Payer: Self-pay | Admitting: Family Medicine

## 2017-04-02 ENCOUNTER — Other Ambulatory Visit: Payer: Self-pay

## 2017-04-02 ENCOUNTER — Ambulatory Visit (INDEPENDENT_AMBULATORY_CARE_PROVIDER_SITE_OTHER): Payer: Medicare Other | Admitting: Family Medicine

## 2017-04-02 ENCOUNTER — Encounter: Payer: Self-pay | Admitting: Family Medicine

## 2017-04-02 VITALS — BP 122/74 | HR 78 | Temp 98.1°F | Resp 16 | Ht 73.0 in | Wt 284.0 lb

## 2017-04-02 DIAGNOSIS — E119 Type 2 diabetes mellitus without complications: Secondary | ICD-10-CM

## 2017-04-02 DIAGNOSIS — E7800 Pure hypercholesterolemia, unspecified: Secondary | ICD-10-CM

## 2017-04-02 DIAGNOSIS — Z6837 Body mass index (BMI) 37.0-37.9, adult: Secondary | ICD-10-CM | POA: Diagnosis not present

## 2017-04-02 DIAGNOSIS — I952 Hypotension due to drugs: Secondary | ICD-10-CM | POA: Diagnosis not present

## 2017-04-02 NOTE — Progress Notes (Signed)
   Subjective:    Patient ID: [name] [name], male    DOB: 09-24-1950, 66 y.o.   MRN: [id number]  Patient presents for Blood Pressure Dropping (states that he was having episodes of BP dropping, fatigue and msucle cramps- states that he had resumed taking Lipitor for cholesterol and thinks it's side effects)  BP has been dropping after taking some old lipitor he started 1st of October was taking 20mg  of lipitor , stopped taking last night, , states it has been down to 673 systolic    , currently on lisinopril HCTZ 20-25mg  once a day , feels better today    DM- last A1C  6.8% 3 months ago, currently on Glipizide 10mg  XR, Janumet, CBG was dropping down to 80's,   Hyperlipidemia-   LDL was up to 131 - he declines statins  In past but tried to restart old medication from about 3 years ago when his symptoms of BP dropped and had muscle cramps /fatigue  Review Of Systems:  GEN- + fatigue, fever, weight loss,weakness, recent illness HEENT- denies eye drainage, change in vision, nasal discharge, CVS- denies chest pain, palpitations RESP- denies SOB, cough, wheeze ABD- denies N/V, change in stools, abd pain GU- denies dysuria, hematuria, dribbling, incontinence MSK- denies joint pain,+ muscle aches, injury Neuro- denies headache, dizziness, syncope, seizure activity       Objective:    BP 122/74   Pulse 78   Temp 98.1 F (36.7 C) (Oral)   Resp 16   Ht 6\' 1"  (1.854 m)   Wt 284 lb (128.8 kg)   SpO2 99%   BMI 37.47 kg/m  GEN- NAD, alert and oriented x3 HEENT- PERRL, EOMI, non injected sclera, pink conjunctiva, MMM, oropharynx clear Neck- Supple, no thyromegaly CVS- RRR, no murmur RESP-CTAB EXT- No edema Pulses- Radial, DP- 2+        Assessment & Plan:      Problem List Items Addressed This Visit      Unprioritized   Obesity   Hyperlipidemia   Relevant Orders   Lipid panel   Diabetes mellitus, type II ([place]) - Primary    Has been fairly well controlled, concerned about the  hypoglycemia episodes, no change in weight, diet Check A1C, RENAL FUNCTION  For the symptoms with statins, will just stop again  Hypotension has resolved, but advised if BP is dropping he can take 1/2 tablet of the lisinipril HCTZ for now  Continue to work on weight loss, would benefit from at least 20lb weight loss       Relevant Orders   CBC with Differential/Platelet   Comprehensive metabolic panel   Hemoglobin A1c    Other Visit Diagnoses    Hypotension due to drugs          Note: This dictation was prepared with Dragon dictation along with smaller phrase technology. Any transcriptional errors that result from this process are unintentional.

## 2017-04-02 NOTE — Assessment & Plan Note (Signed)
Has been fairly well controlled, concerned about the hypoglycemia episodes, no change in weight, diet Check A1C, RENAL FUNCTION  For the symptoms with statins, will just stop again  Hypotension has resolved, but advised if BP is dropping he can take 1/2 tablet of the lisinipril HCTZ for now  Continue to work on weight loss, would benefit from at least 20lb weight loss

## 2017-04-02 NOTE — Patient Instructions (Addendum)
Take 1/2 tablet of BP medication if staying < 120/70 We will call with lab results Cancel appt for  12/31 FU 4  Months

## 2017-04-03 LAB — COMPREHENSIVE METABOLIC PANEL
AG RATIO: 1.4 (calc) (ref 1.0–2.5)
ALT: 17 U/L (ref 9–46)
AST: 17 U/L (ref 10–35)
Albumin: 4.1 g/dL (ref 3.6–5.1)
Alkaline phosphatase (APISO): 54 U/L (ref 40–115)
BUN / CREAT RATIO: 15 (calc) (ref 6–22)
BUN: 20 mg/dL (ref 7–25)
CHLORIDE: 101 mmol/L (ref 98–110)
CO2: 30 mmol/L (ref 20–32)
Calcium: 9.6 mg/dL (ref 8.6–10.3)
Creat: 1.34 mg/dL — ABNORMAL HIGH (ref 0.70–1.25)
GLOBULIN: 2.9 g/dL (ref 1.9–3.7)
GLUCOSE: 164 mg/dL — AB (ref 65–99)
Potassium: 4.3 mmol/L (ref 3.5–5.3)
SODIUM: 139 mmol/L (ref 135–146)
TOTAL PROTEIN: 7 g/dL (ref 6.1–8.1)
Total Bilirubin: 0.7 mg/dL (ref 0.2–1.2)

## 2017-04-03 LAB — CBC WITH DIFFERENTIAL/PLATELET
BASOS PCT: 0.5 %
Basophils Absolute: 28 cells/uL (ref 0–200)
EOS ABS: 280 {cells}/uL (ref 15–500)
Eosinophils Relative: 5 %
HCT: 42.3 % (ref 38.5–50.0)
HEMOGLOBIN: 14.3 g/dL (ref 13.2–17.1)
LYMPHS ABS: 1966 {cells}/uL (ref 850–3900)
MCH: 28.7 pg (ref 27.0–33.0)
MCHC: 33.8 g/dL (ref 32.0–36.0)
MCV: 84.8 fL (ref 80.0–100.0)
MONOS PCT: 10.5 %
MPV: 11.3 fL (ref 7.5–12.5)
NEUTROS ABS: 2738 {cells}/uL (ref 1500–7800)
Neutrophils Relative %: 48.9 %
Platelets: 206 10*3/uL (ref 140–400)
RBC: 4.99 10*6/uL (ref 4.20–5.80)
RDW: 14 % (ref 11.0–15.0)
Total Lymphocyte: 35.1 %
WBC mixed population: 588 cells/uL (ref 200–950)
WBC: 5.6 10*3/uL (ref 3.8–10.8)

## 2017-04-03 LAB — LIPID PANEL
Cholesterol: 147 mg/dL (ref <200)
HDL: 45 mg/dL (ref >40)
LDL Cholesterol (Calc): 80 mg/dL (calc)
NON-HDL CHOLESTEROL (CALC): 102 mg/dL (ref <130)
Total CHOL/HDL Ratio: 3.3 (calc) (ref <5.0)
Triglycerides: 122 mg/dL (ref <150)

## 2017-04-03 LAB — HEMOGLOBIN A1C
EAG (MMOL/L): 8.2 (calc)
Hgb A1c MFr Bld: 6.8 % of total Hgb — ABNORMAL HIGH (ref <5.7)
Mean Plasma Glucose: 148 (calc)

## 2017-04-09 ENCOUNTER — Encounter: Payer: Self-pay | Admitting: *Deleted

## 2017-04-26 ENCOUNTER — Ambulatory Visit: Payer: No Typology Code available for payment source | Admitting: Family Medicine

## 2017-06-17 ENCOUNTER — Other Ambulatory Visit: Payer: Self-pay | Admitting: Family Medicine

## 2017-08-02 ENCOUNTER — Encounter: Payer: Self-pay | Admitting: Family Medicine

## 2017-08-02 ENCOUNTER — Other Ambulatory Visit: Payer: Self-pay

## 2017-08-02 ENCOUNTER — Ambulatory Visit (INDEPENDENT_AMBULATORY_CARE_PROVIDER_SITE_OTHER): Payer: PPO | Admitting: Family Medicine

## 2017-08-02 VITALS — BP 122/66 | HR 80 | Temp 98.5°F | Resp 14 | Ht 73.0 in | Wt 287.0 lb

## 2017-08-02 DIAGNOSIS — Z6837 Body mass index (BMI) 37.0-37.9, adult: Secondary | ICD-10-CM

## 2017-08-02 DIAGNOSIS — M791 Myalgia, unspecified site: Secondary | ICD-10-CM

## 2017-08-02 DIAGNOSIS — E119 Type 2 diabetes mellitus without complications: Secondary | ICD-10-CM | POA: Diagnosis not present

## 2017-08-02 DIAGNOSIS — T466X5A Adverse effect of antihyperlipidemic and antiarteriosclerotic drugs, initial encounter: Secondary | ICD-10-CM

## 2017-08-02 DIAGNOSIS — I10 Essential (primary) hypertension: Secondary | ICD-10-CM | POA: Diagnosis not present

## 2017-08-02 NOTE — Assessment & Plan Note (Signed)
Blood pressure is controlled.  Continue to work on dietary changes and weight loss.  No change to medication

## 2017-08-02 NOTE — Progress Notes (Signed)
   Subjective:    Patient ID: [name] [name], male    DOB: 24-May-1950, 67 y.o.   MRN: [id number]  Patient presents for Follow-up (is fasting)  Pt here to f/u chronic medical problems  medications reviewed   DM- Last A1C  6.8%, CBG range 109-156 fasting  , taking glipizie, Janumet  Admits to eating late at night    HTN- taking lisinopril HCTZ, recent BP 124-144/70-83  Declines statin drug due to side effects , taking fish oil, last LDL 80  In Dec 2018  Medications reviewed    dECLINES Immunizations  [facility] Linna Hoff- This month    Review Of Systems:  GEN- denies fatigue, fever, weight loss,weakness, recent illness HEENT- denies eye drainage, change in vision, nasal discharge, CVS- denies chest pain, palpitations RESP- denies SOB, cough, wheeze ABD- denies N/V, change in stools, abd pain GU- denies dysuria, hematuria, dribbling, incontinence MSK- denies joint pain, muscle aches, injury Neuro- denies headache, dizziness, syncope, seizure activity       Objective:    BP 122/66   Pulse 80   Temp 98.5 F (36.9 C) (Oral)   Resp 14   Ht 6\' 1"  (1.854 m)   Wt 287 lb (130.2 kg)   SpO2 98%   BMI 37.87 kg/m  GEN- NAD, alert and oriented x3,obese  HEENT- PERRL, EOMI, non injected sclera, pink conjunctiva, MMM, oropharynx clear Neck- Supple, no thyromegaly CVS- RRR, no murmur RESP-CTAB ABD-NABS,soft,NT,ND EXT- No edema Pulses- Radial, DP- 2+        Assessment & Plan:      Problem List Items Addressed This Visit      Unprioritized   Obesity - Primary   Myalgia due to HMG CoA reductase inhibitor   HYPERTENSION, BENIGN    Blood pressure is controlled.  Continue to work on dietary changes and weight loss.  No change to medication      Diabetes mellitus, type II ([place])    Diabetes has been well controlled.  I do worry about his increasing weight in the setting of his diabetes and hypertension he does have risk factors for heart disease.  Continue to reiterate  need for weight loss would like to see a 20 pound weight loss.  Will check urine micro today he has an appointment with the eye doctor scheduled for this month.  Fasting labs to be done.  Does not tolerate statin drug but he is on ACE inhibitor.  Declines immunizations.      Relevant Orders   Microalbumin / creatinine urine ratio   Comprehensive metabolic panel   Hemoglobin A1c   HM DIABETES FOOT EXAM (Completed)      Note: This dictation was prepared with Dragon dictation along with smaller phrase technology. Any transcriptional errors that result from this process are unintentional.

## 2017-08-02 NOTE — Patient Instructions (Signed)
F/U end of August for Physical

## 2017-08-02 NOTE — Assessment & Plan Note (Signed)
Diabetes has been well controlled.  I do worry about his increasing weight in the setting of his diabetes and hypertension he does have risk factors for heart disease.  Continue to reiterate need for weight loss would like to see a 20 pound weight loss.  Will check urine micro today he has an appointment with the eye doctor scheduled for this month.  Fasting labs to be done.  Does not tolerate statin drug but he is on ACE inhibitor.  Declines immunizations.

## 2017-08-03 LAB — MICROALBUMIN / CREATININE URINE RATIO
CREATININE, URINE: 65 mg/dL (ref 20–320)
Microalb Creat Ratio: 5 mcg/mg creat (ref <30)
Microalb, Ur: 0.3 mg/dL

## 2017-08-03 LAB — COMPREHENSIVE METABOLIC PANEL
AG Ratio: 1.8 (calc) (ref 1.0–2.5)
ALBUMIN MSPROF: 4.4 g/dL (ref 3.6–5.1)
ALKALINE PHOSPHATASE (APISO): 50 U/L (ref 40–115)
ALT: 16 U/L (ref 9–46)
AST: 16 U/L (ref 10–35)
BILIRUBIN TOTAL: 0.8 mg/dL (ref 0.2–1.2)
BUN/Creatinine Ratio: 9 (calc) (ref 6–22)
BUN: 13 mg/dL (ref 7–25)
CALCIUM: 9.8 mg/dL (ref 8.6–10.3)
CHLORIDE: 102 mmol/L (ref 98–110)
CO2: 30 mmol/L (ref 20–32)
Creat: 1.38 mg/dL — ABNORMAL HIGH (ref 0.70–1.25)
GLOBULIN: 2.5 g/dL (ref 1.9–3.7)
Glucose, Bld: 130 mg/dL — ABNORMAL HIGH (ref 65–99)
POTASSIUM: 4.4 mmol/L (ref 3.5–5.3)
Sodium: 140 mmol/L (ref 135–146)
Total Protein: 6.9 g/dL (ref 6.1–8.1)

## 2017-08-03 LAB — HEMOGLOBIN A1C
EAG (MMOL/L): 8.1 (calc)
Hgb A1c MFr Bld: 6.7 % of total Hgb — ABNORMAL HIGH (ref <5.7)
MEAN PLASMA GLUCOSE: 146 (calc)

## 2017-08-04 ENCOUNTER — Encounter: Payer: Self-pay | Admitting: *Deleted

## 2017-08-17 DIAGNOSIS — H52223 Regular astigmatism, bilateral: Secondary | ICD-10-CM | POA: Diagnosis not present

## 2017-08-17 DIAGNOSIS — Z961 Presence of intraocular lens: Secondary | ICD-10-CM | POA: Diagnosis not present

## 2017-08-17 DIAGNOSIS — E119 Type 2 diabetes mellitus without complications: Secondary | ICD-10-CM | POA: Diagnosis not present

## 2017-08-17 LAB — HM DIABETES EYE EXAM

## 2017-11-06 ENCOUNTER — Other Ambulatory Visit: Payer: Self-pay | Admitting: Family Medicine

## 2017-12-24 ENCOUNTER — Ambulatory Visit (INDEPENDENT_AMBULATORY_CARE_PROVIDER_SITE_OTHER): Payer: PPO | Admitting: Family Medicine

## 2017-12-24 ENCOUNTER — Encounter: Payer: Self-pay | Admitting: Family Medicine

## 2017-12-24 ENCOUNTER — Other Ambulatory Visit: Payer: Self-pay

## 2017-12-24 VITALS — BP 128/60 | HR 70 | Temp 98.1°F | Resp 14 | Ht 73.0 in | Wt 289.0 lb

## 2017-12-24 DIAGNOSIS — Z125 Encounter for screening for malignant neoplasm of prostate: Secondary | ICD-10-CM

## 2017-12-24 DIAGNOSIS — I10 Essential (primary) hypertension: Secondary | ICD-10-CM | POA: Diagnosis not present

## 2017-12-24 DIAGNOSIS — E119 Type 2 diabetes mellitus without complications: Secondary | ICD-10-CM

## 2017-12-24 DIAGNOSIS — E7800 Pure hypercholesterolemia, unspecified: Secondary | ICD-10-CM | POA: Diagnosis not present

## 2017-12-24 DIAGNOSIS — Z0000 Encounter for general adult medical examination without abnormal findings: Secondary | ICD-10-CM

## 2017-12-24 DIAGNOSIS — Z6837 Body mass index (BMI) 37.0-37.9, adult: Secondary | ICD-10-CM

## 2017-12-24 NOTE — Patient Instructions (Signed)
F/u 4 MONTHS, okay to schedule into Jan if needed

## 2017-12-24 NOTE — Progress Notes (Signed)
Subjective:   Patient presents for Medicare Annual/Subsequent preventive examination.   She here for annual wellness exam.  He has no specific concerns.  He is taking his medications as prescribed.  Diabetes mellitus he did not bring his readings with him today but states that they have been good.  His last A1c was 6.7%.  He has not had any hypoglycemia symptoms.  Hypertension states his blood pressures been good he did have a couple of episodes where his blood pressure got down to about 117 he did not feel dizzy or faint but was concerned about how low his blood pressure was allowed to go.  He has not had any chest pain or shortness of breath.  Family history updated  Review Past Medical/Family/Social: Per EMR   Risk Factors  Current exercise habits: walks  Dietary issues discussed: Yes  Cardiac risk factors: Obesity (BMI >= 30 kg/m2). DM,HTN  Depression Screen  (Note: if answer to either of the following is "Yes", a more complete depression screening is indicated)  Over the past two weeks, have you felt down, depressed or hopeless? No Over the past two weeks, have you felt little interest or pleasure in doing things? No Have you lost interest or pleasure in daily life? No Do you often feel hopeless? No Do you cry easily over simple problems? No   Activities of Daily Living  In your present state of health, do you have any difficulty performing the following activities?:  Driving? No  Managing money? No  Feeding yourself? No  Getting from bed to chair? No  Climbing a flight of stairs? No  Preparing food and eating?: No  Bathing or showering? No  Getting dressed: No  Getting to the toilet? No  Using the toilet:No  Moving around from place to place: No  In the past year have you fallen or had a near fall?:No  Are you sexually active? Yes  Do you have more than one partner? No   Hearing Difficulties: No  Do you often ask people to speak up or repeat themselves? No  Do you  experience ringing or noises in your ears? No Do you have difficulty understanding soft or whispered voices? No  Do you feel that you have a problem with memory? No Do you often misplace items? No  Do you feel safe at home? Yes  Cognitive Testing  Alert? Yes Normal Appearance?Yes  Oriented to person? Yes Place? Yes  Time? Yes  Recall of three objects? Yes  Can perform simple calculations? Yes  Displays appropriate judgment?Yes  Can read the correct time from a watch face?Yes   List the Names of Other Physician/Practitioners you currently use:  [facility]- [place]   Screening Tests / Date Colonoscopy   UTD                  TDAP-UTD Declines other immunizations   ROS: GEN- denies fatigue, fever, weight loss,weakness, recent illness HEENT- denies eye drainage, change in vision, nasal discharge, CVS- denies chest pain, palpitations RESP- denies SOB, cough, wheeze ABD- denies N/V, change in stools, abd pain GU- denies dysuria, hematuria, dribbling, incontinence MSK- denies joint pain, muscle aches, injury Neuro- denies headache, dizziness, syncope, seizure activity  Physical: Vitals reviewed GEN- NAD, alert and oriented x3, obese HEENT- PERRL, EOMI, non injected sclera, pink conjunctiva, MMM, oropharynx clear, TM clear bilat no effusion  Neck- Supple, no thryomegaly, no bruit  CVS- RRR, no murmur RESP-CTAB ABD-NABS,soft,NT,ND EXT- No edema Pulses- Radial, DP-  2+     Assessment:    Annual wellness medicare exam   Plan:    During the course of the visit the patient was educated and counseled about appropriate screening and preventive services including:  NEG CAGE/DEPRESSION/FALL  Discussed advanced directives- he has paperwork at home, Currently FULL CODE  DM-diabetes has been well controlled.  We will recheck his A1c today as well as his lipid panel metabolic panel.  obesity-he has not had any significant weight changes.  Continue to reiterate diet and  exercise.  Hypertension blood pressures been well controlled.  Advised that if he does get low readings he can hold his blood pressure medicine until he calls in we would be able to decrease him to half dose if needed.  PSA screening to be done  Declines further immunizations/Hep C screening    Diet review for nutrition referral? Yes ____ Not Indicated __x__  Patient Instructions (the written plan) was given to the patient.  Medicare Attestation  I have personally reviewed:  The patient's medical and social history  Their use of alcohol, tobacco or illicit drugs  Their current medications and supplements  The patient's functional ability including ADLs,fall risks, home safety risks, cognitive, and hearing and visual impairment  Diet and physical activities  Evidence for depression or mood disorders  The patient's weight, height, BMI, and visual acuity have been recorded in the chart. I have made referrals, counseling, and provided education to the patient based on review of the above and I have provided the patient with a written personalized care plan for preventive services.

## 2017-12-25 LAB — CBC WITH DIFFERENTIAL/PLATELET
BASOS ABS: 32 {cells}/uL (ref 0–200)
BASOS PCT: 0.6 %
EOS PCT: 3.7 %
Eosinophils Absolute: 200 cells/uL (ref 15–500)
HCT: 43.1 % (ref 38.5–50.0)
HEMOGLOBIN: 14.2 g/dL (ref 13.2–17.1)
LYMPHS ABS: 2106 {cells}/uL (ref 850–3900)
MCH: 28.6 pg (ref 27.0–33.0)
MCHC: 32.9 g/dL (ref 32.0–36.0)
MCV: 86.9 fL (ref 80.0–100.0)
MONOS PCT: 10.5 %
MPV: 11.9 fL (ref 7.5–12.5)
NEUTROS ABS: 2495 {cells}/uL (ref 1500–7800)
Neutrophils Relative %: 46.2 %
Platelets: 198 10*3/uL (ref 140–400)
RBC: 4.96 10*6/uL (ref 4.20–5.80)
RDW: 13.9 % (ref 11.0–15.0)
Total Lymphocyte: 39 %
WBC mixed population: 567 cells/uL (ref 200–950)
WBC: 5.4 10*3/uL (ref 3.8–10.8)

## 2017-12-25 LAB — COMPREHENSIVE METABOLIC PANEL
AG RATIO: 1.7 (calc) (ref 1.0–2.5)
ALBUMIN MSPROF: 4.3 g/dL (ref 3.6–5.1)
ALT: 13 U/L (ref 9–46)
AST: 15 U/L (ref 10–35)
Alkaline phosphatase (APISO): 51 U/L (ref 40–115)
BUN / CREAT RATIO: 11 (calc) (ref 6–22)
BUN: 15 mg/dL (ref 7–25)
CO2: 27 mmol/L (ref 20–32)
Calcium: 9.7 mg/dL (ref 8.6–10.3)
Chloride: 102 mmol/L (ref 98–110)
Creat: 1.38 mg/dL — ABNORMAL HIGH (ref 0.70–1.25)
GLUCOSE: 124 mg/dL — AB (ref 65–99)
Globulin: 2.6 g/dL (calc) (ref 1.9–3.7)
POTASSIUM: 4.3 mmol/L (ref 3.5–5.3)
SODIUM: 140 mmol/L (ref 135–146)
TOTAL PROTEIN: 6.9 g/dL (ref 6.1–8.1)
Total Bilirubin: 1 mg/dL (ref 0.2–1.2)

## 2017-12-25 LAB — HEMOGLOBIN A1C
EAG (MMOL/L): 8.1 (calc)
Hgb A1c MFr Bld: 6.7 % of total Hgb — ABNORMAL HIGH (ref <5.7)
MEAN PLASMA GLUCOSE: 146 (calc)

## 2017-12-25 LAB — LIPID PANEL
Cholesterol: 181 mg/dL (ref <200)
HDL: 41 mg/dL (ref >40)
LDL CHOLESTEROL (CALC): 117 mg/dL — AB
NON-HDL CHOLESTEROL (CALC): 140 mg/dL — AB (ref <130)
TRIGLYCERIDES: 121 mg/dL (ref <150)
Total CHOL/HDL Ratio: 4.4 (calc) (ref <5.0)

## 2017-12-25 LAB — PSA: PSA: 0.9 ng/mL (ref <=4.0)

## 2017-12-28 ENCOUNTER — Encounter: Payer: Self-pay | Admitting: *Deleted

## 2018-01-03 ENCOUNTER — Encounter: Payer: Self-pay | Admitting: *Deleted

## 2018-03-01 ENCOUNTER — Other Ambulatory Visit: Payer: Self-pay | Admitting: Family Medicine

## 2018-05-02 ENCOUNTER — Encounter: Payer: Self-pay | Admitting: Family Medicine

## 2018-05-02 ENCOUNTER — Other Ambulatory Visit: Payer: Self-pay

## 2018-05-02 ENCOUNTER — Ambulatory Visit (INDEPENDENT_AMBULATORY_CARE_PROVIDER_SITE_OTHER): Payer: PPO | Admitting: Family Medicine

## 2018-05-02 VITALS — BP 136/80 | HR 62 | Temp 98.6°F | Resp 14 | Ht 73.0 in | Wt 291.0 lb

## 2018-05-02 DIAGNOSIS — I10 Essential (primary) hypertension: Secondary | ICD-10-CM | POA: Diagnosis not present

## 2018-05-02 DIAGNOSIS — E7800 Pure hypercholesterolemia, unspecified: Secondary | ICD-10-CM

## 2018-05-02 DIAGNOSIS — Z6838 Body mass index (BMI) 38.0-38.9, adult: Secondary | ICD-10-CM | POA: Diagnosis not present

## 2018-05-02 DIAGNOSIS — E119 Type 2 diabetes mellitus without complications: Secondary | ICD-10-CM | POA: Diagnosis not present

## 2018-05-02 NOTE — Assessment & Plan Note (Signed)
Diabetes has been well controlled.  Continue to work on dietary changes increase exercise he would benefit from weight loss.  Continue Janumet and glipizide.  He declined statin drug he is on ACE inhibitor

## 2018-05-02 NOTE — Assessment & Plan Note (Signed)
Blood pressure is controlled no changes medication we will check his renal function and potassium

## 2018-05-02 NOTE — Progress Notes (Signed)
   Subjective:    Patient ID: [name] [name], male    DOB: 02-23-1951, 68 y.o.   MRN: [id number]  Patient presents for Follow-up (is fasting)  Pt here to f/u chronic medical problems  Medications reviewed    DM- last A1C 6.7% in August ,  CBG- range 86-130's. Had 1 elevated 185 during holiday , no hypoglycemia episodes.  He medication without any difficulties Janumet and glipizide   Hyperlipidemia- declines statin drug   Obesity- continues to gain weight in setting of diabetes mellitus needs to dietary indiscretions especially during the holidays.   HTN- BP average 130/70-80's , he is taking blood pressure medicine without any difficulties he has not had any hypotensive episodes  New supplements: Calcium/zinc/mag Vitamin C 500mg    Review Of Systems:  GEN- denies fatigue, fever, weight loss,weakness, recent illness HEENT- denies eye drainage, change in vision, nasal discharge, CVS- denies chest pain, palpitations RESP- denies SOB, cough, wheeze ABD- denies N/V, change in stools, abd pain GU- denies dysuria, hematuria, dribbling, incontinence MSK- denies joint pain, muscle aches, injury Neuro- denies headache, dizziness, syncope, seizure activity       Objective:    BP 136/80   Pulse 62   Temp 98.6 F (37 C) (Oral)   Resp 14   Ht 6\' 1"  (1.854 m)   Wt 291 lb (132 kg)   SpO2 97%   BMI 38.39 kg/m  GEN- NAD, alert and oriented x3,obese  HEENT- PERRL, EOMI, non injected sclera, pink conjunctiva, MMM, oropharynx clear Neck- Supple  CVS- RRR, no murmur RESP-CTAB EXT- No edema Pulses- Radial, DP- 2+        Assessment & Plan:      Problem List Items Addressed This Visit      Unprioritized   Diabetes mellitus, type II ([place])    Diabetes has been well controlled.  Continue to work on dietary changes increase exercise he would benefit from weight loss.  Continue Janumet and glipizide.  He declined statin drug he is on ACE inhibitor      Relevant Orders   Comprehensive metabolic panel   Hemoglobin A1c   Lipid panel   Hyperlipidemia   Relevant Orders   Lipid panel   HYPERTENSION, BENIGN - Primary    Blood pressure is controlled no changes medication we will check his renal function and potassium      Obesity      Note: This dictation was prepared with Dragon dictation along with smaller phrase technology. Any transcriptional errors that result from this process are unintentional.

## 2018-05-02 NOTE — Patient Instructions (Signed)
F/U 4 months  

## 2018-05-03 ENCOUNTER — Encounter: Payer: Self-pay | Admitting: *Deleted

## 2018-05-03 LAB — HEMOGLOBIN A1C
Hgb A1c MFr Bld: 7 % of total Hgb — ABNORMAL HIGH (ref <5.7)
MEAN PLASMA GLUCOSE: 154 (calc)
eAG (mmol/L): 8.5 (calc)

## 2018-05-03 LAB — COMPREHENSIVE METABOLIC PANEL
AG Ratio: 1.8 (calc) (ref 1.0–2.5)
ALBUMIN MSPROF: 4.5 g/dL (ref 3.6–5.1)
ALT: 18 U/L (ref 9–46)
AST: 16 U/L (ref 10–35)
Alkaline phosphatase (APISO): 51 U/L (ref 40–115)
BUN / CREAT RATIO: 12 (calc) (ref 6–22)
BUN: 15 mg/dL (ref 7–25)
CHLORIDE: 102 mmol/L (ref 98–110)
CO2: 28 mmol/L (ref 20–32)
CREATININE: 1.27 mg/dL — AB (ref 0.70–1.25)
Calcium: 9.7 mg/dL (ref 8.6–10.3)
GLOBULIN: 2.5 g/dL (ref 1.9–3.7)
GLUCOSE: 124 mg/dL — AB (ref 65–99)
Potassium: 4.2 mmol/L (ref 3.5–5.3)
SODIUM: 138 mmol/L (ref 135–146)
TOTAL PROTEIN: 7 g/dL (ref 6.1–8.1)
Total Bilirubin: 0.7 mg/dL (ref 0.2–1.2)

## 2018-05-03 LAB — LIPID PANEL
Cholesterol: 199 mg/dL (ref <200)
HDL: 44 mg/dL (ref >40)
LDL CHOLESTEROL (CALC): 130 mg/dL — AB
NON-HDL CHOLESTEROL (CALC): 155 mg/dL — AB (ref <130)
TRIGLYCERIDES: 139 mg/dL (ref <150)
Total CHOL/HDL Ratio: 4.5 (calc) (ref <5.0)

## 2018-08-09 ENCOUNTER — Other Ambulatory Visit: Payer: Self-pay | Admitting: Family Medicine

## 2018-08-31 ENCOUNTER — Ambulatory Visit: Payer: PPO | Admitting: Family Medicine

## 2018-08-31 ENCOUNTER — Ambulatory Visit (INDEPENDENT_AMBULATORY_CARE_PROVIDER_SITE_OTHER): Payer: PPO | Admitting: Family Medicine

## 2018-08-31 ENCOUNTER — Other Ambulatory Visit: Payer: Self-pay

## 2018-08-31 ENCOUNTER — Encounter: Payer: Self-pay | Admitting: Family Medicine

## 2018-08-31 DIAGNOSIS — E7800 Pure hypercholesterolemia, unspecified: Secondary | ICD-10-CM

## 2018-08-31 DIAGNOSIS — E119 Type 2 diabetes mellitus without complications: Secondary | ICD-10-CM | POA: Diagnosis not present

## 2018-08-31 DIAGNOSIS — Z6838 Body mass index (BMI) 38.0-38.9, adult: Secondary | ICD-10-CM | POA: Diagnosis not present

## 2018-08-31 DIAGNOSIS — I10 Essential (primary) hypertension: Secondary | ICD-10-CM | POA: Diagnosis not present

## 2018-08-31 NOTE — Progress Notes (Signed)
Virtual Visit via Telephone Note  I connected with [name] [name] on 08/31/18 at 8:55am   by telephone and verified that I am speaking with the correct person using two identifiers.   Pt location: in home  Physician location: in office, [place], Vic Blackbird MD  I discussed the limitations, risks, security and privacy concerns of performing an evaluation and management service by telephone and the availability of in person appointments. I also discussed with the patient that there may be a patient responsible charge related to this service. The patient expressed understanding and agreed to proceed.   History of Present Illness:  No concerns  DM- last A1C 7% in Jan  114 -130, highest 140's, no hypoglycemia ,     - Glipizide Janumet/ Glipizide     Feels good,trying to stay active walking some   HTN- Recent  home readings  128/77,  132/74  137/70 This AM 136/79   - lisinopril HCTZ   Obesity- has gained 2-3lbs   Taking vitamins       Observations/Objective: Telephone visit, NAD  Assessment and Plan: DM-fasting blood sugar showed good control.  Can continue the Janumet and the glipizide.  Trying to watch the carbs at home and sweets.  We will plan to have fasting labs done in about 6 weeks here in the office. HTN-pressures well controlled at home no change lisinopril HCTZ  Hyperlipidemia is declined statin drug last LDL 130 recheck with his labs Obesity- continue to work on dietary changes  Follow Up Instructions: 6 Weeks- for labs     I discussed the assessment and treatment plan with the patient. The patient was provided an opportunity to ask questions and all were answered. The patient agreed with the plan and demonstrated an understanding of the instructions.   The patient was advised to call back or seek an in-person evaluation if the symptoms worsen or if the condition fails to improve as anticipated.  I provided  70minutes of non-face-to-face time during  this encounter. End Time:9:03am   Vic Blackbird, MD

## 2018-10-10 ENCOUNTER — Other Ambulatory Visit: Payer: Self-pay

## 2018-10-10 ENCOUNTER — Other Ambulatory Visit: Payer: PPO

## 2018-10-10 DIAGNOSIS — I10 Essential (primary) hypertension: Secondary | ICD-10-CM | POA: Diagnosis not present

## 2018-10-10 DIAGNOSIS — E119 Type 2 diabetes mellitus without complications: Secondary | ICD-10-CM

## 2018-10-10 DIAGNOSIS — E7800 Pure hypercholesterolemia, unspecified: Secondary | ICD-10-CM

## 2018-10-11 LAB — CBC WITH DIFFERENTIAL/PLATELET
Absolute Monocytes: 664 cells/uL (ref 200–950)
Basophils Absolute: 22 cells/uL (ref 0–200)
Basophils Relative: 0.4 %
Eosinophils Absolute: 313 cells/uL (ref 15–500)
Eosinophils Relative: 5.8 %
HCT: 41.8 % (ref 38.5–50.0)
Hemoglobin: 13.8 g/dL (ref 13.2–17.1)
Lymphs Abs: 1771 cells/uL (ref 850–3900)
MCH: 28.5 pg (ref 27.0–33.0)
MCHC: 33 g/dL (ref 32.0–36.0)
MCV: 86.2 fL (ref 80.0–100.0)
MPV: 11.3 fL (ref 7.5–12.5)
Monocytes Relative: 12.3 %
Neutro Abs: 2630 cells/uL (ref 1500–7800)
Neutrophils Relative %: 48.7 %
Platelets: 198 10*3/uL (ref 140–400)
RBC: 4.85 10*6/uL (ref 4.20–5.80)
RDW: 14 % (ref 11.0–15.0)
Total Lymphocyte: 32.8 %
WBC: 5.4 10*3/uL (ref 3.8–10.8)

## 2018-10-11 LAB — MICROALBUMIN / CREATININE URINE RATIO
Creatinine, Urine: 88 mg/dL (ref 20–320)
Microalb Creat Ratio: 6 mcg/mg creat (ref <30)
Microalb, Ur: 0.5 mg/dL

## 2018-10-11 LAB — COMPREHENSIVE METABOLIC PANEL
AG Ratio: 1.6 (calc) (ref 1.0–2.5)
ALT: 19 U/L (ref 9–46)
AST: 18 U/L (ref 10–35)
Albumin: 4.2 g/dL (ref 3.6–5.1)
Alkaline phosphatase (APISO): 53 U/L (ref 35–144)
BUN/Creatinine Ratio: 12 (calc) (ref 6–22)
BUN: 18 mg/dL (ref 7–25)
CO2: 25 mmol/L (ref 20–32)
Calcium: 9.8 mg/dL (ref 8.6–10.3)
Chloride: 101 mmol/L (ref 98–110)
Creat: 1.45 mg/dL — ABNORMAL HIGH (ref 0.70–1.25)
Globulin: 2.7 g/dL (calc) (ref 1.9–3.7)
Glucose, Bld: 199 mg/dL — ABNORMAL HIGH (ref 65–99)
Potassium: 4.7 mmol/L (ref 3.5–5.3)
Sodium: 138 mmol/L (ref 135–146)
Total Bilirubin: 1 mg/dL (ref 0.2–1.2)
Total Protein: 6.9 g/dL (ref 6.1–8.1)

## 2018-10-11 LAB — LIPID PANEL
Cholesterol: 194 mg/dL (ref <200)
HDL: 41 mg/dL (ref >=40)
LDL Cholesterol (Calc): 125 mg/dL (calc) — ABNORMAL HIGH
Non-HDL Cholesterol (Calc): 153 mg/dL (calc) — ABNORMAL HIGH (ref <130)
Total CHOL/HDL Ratio: 4.7 (calc) (ref <5.0)
Triglycerides: 161 mg/dL — ABNORMAL HIGH (ref <150)

## 2018-10-11 LAB — HEMOGLOBIN A1C
Hgb A1c MFr Bld: 7 % of total Hgb — ABNORMAL HIGH (ref <5.7)
Mean Plasma Glucose: 154 (calc)
eAG (mmol/L): 8.5 (calc)

## 2018-11-11 ENCOUNTER — Other Ambulatory Visit: Payer: Self-pay

## 2018-11-14 ENCOUNTER — Encounter: Payer: Self-pay | Admitting: Family Medicine

## 2018-11-14 ENCOUNTER — Ambulatory Visit (INDEPENDENT_AMBULATORY_CARE_PROVIDER_SITE_OTHER): Payer: PPO | Admitting: Family Medicine

## 2018-11-14 VITALS — BP 132/72 | HR 78 | Temp 98.5°F | Resp 14 | Ht 73.0 in | Wt 286.0 lb

## 2018-11-14 DIAGNOSIS — E7800 Pure hypercholesterolemia, unspecified: Secondary | ICD-10-CM

## 2018-11-14 DIAGNOSIS — Z6837 Body mass index (BMI) 37.0-37.9, adult: Secondary | ICD-10-CM

## 2018-11-14 DIAGNOSIS — N182 Chronic kidney disease, stage 2 (mild): Secondary | ICD-10-CM | POA: Diagnosis not present

## 2018-11-14 DIAGNOSIS — E119 Type 2 diabetes mellitus without complications: Secondary | ICD-10-CM | POA: Diagnosis not present

## 2018-11-14 DIAGNOSIS — N183 Chronic kidney disease, stage 3 unspecified: Secondary | ICD-10-CM | POA: Insufficient documentation

## 2018-11-14 DIAGNOSIS — I10 Essential (primary) hypertension: Secondary | ICD-10-CM | POA: Diagnosis not present

## 2018-11-14 NOTE — Assessment & Plan Note (Signed)
Goal LDL around 100, declines statin drug

## 2018-11-14 NOTE — Assessment & Plan Note (Signed)
Diabetes controlled, continue to work on dietary changes,  Cut out late night eating as this is driving his fasting sugars No change to meds Goal  A1C less than 7% On ACE Declines statin

## 2018-11-14 NOTE — Assessment & Plan Note (Signed)
Well controlled no changes 

## 2018-11-14 NOTE — Progress Notes (Signed)
   Subjective:    Patient ID: [name] [name], male    DOB: 11/24/1950, 68 y.o.   MRN: [id number]  Patient presents for Follow-up (is fasting)  Pt here to f/u LABS AND chronic medical problems, no specific concerns   DM- last A1C 7% in June 2020, CBG at home   105-144, highest 144, no hypoglycemia ,     - Glipizide Janumet/ Glipizide     Feels good,trying to stay active walking some , but has been hot out for him  realized he has been eating too much bread, cut Down bread past month, weight down 5lbs, also trying to cut down fruit and veggies while watching TV late since his sugar still spikes    HTN- Recent  home readings  117-131/70-80  - lisinopril HCTZ   Obesity-weight down 5lbs since   ARF- Cr was up to 1.45 above his baseline 1.2-138 due for recheck today   REVIEWED Previous labs from June  Review Of Systems:  GEN- denies fatigue, fever, weight loss,weakness, recent illness HEENT- denies eye drainage, change in vision, nasal discharge, CVS- denies chest pain, palpitations RESP- denies SOB, cough, wheeze ABD- denies N/V, change in stools, abd pain GU- denies dysuria, hematuria, dribbling, incontinence MSK- denies joint pain, muscle aches, injury Neuro- denies headache, dizziness, syncope, seizure activity       Objective:    BP 132/72   Pulse 78   Temp 98.5 F (36.9 C) (Oral)   Resp 14   Ht 6\' 1"  (1.854 m)   Wt 286 lb (129.7 kg)   SpO2 96%   BMI 37.73 kg/m  GEN- NAD, alert and oriented x3 HEENT- PERRL, EOMI, non injected sclera, pink conjunctiva, MMM, oropharynx clear Neck- Supple, no thyromegaly CVS- RRR, no murmur RESP-CTAB EXT- No edema Pulses- Radial, DP- 2+        Assessment & Plan:      Problem List Items Addressed This Visit      Unprioritized   CKD (chronic kidney disease), stage II    Baseline 1.2-1.3 Recheck Keep hydrated      Diabetes mellitus, type II ([place])    Diabetes controlled, continue to work on dietary changes,  Cut out late  night eating as this is driving his fasting sugars No change to meds Goal  A1C less than 7% On ACE Declines statin      Relevant Orders   Basic metabolic panel   HM DIABETES FOOT EXAM (Completed)   Hyperlipidemia    Goal LDL around 100, declines statin drug      HYPERTENSION, BENIGN - Primary    Well controlled no changes       Obesity      Note: This dictation was prepared with Dragon dictation along with smaller phrase technology. Any transcriptional errors that result from this process are unintentional.

## 2018-11-14 NOTE — Assessment & Plan Note (Addendum)
Baseline 1.2-1.3 Recheck Keep hydrated

## 2018-11-14 NOTE — Patient Instructions (Signed)
F/U 3 months for Physical  We will call with lab results

## 2018-11-15 ENCOUNTER — Other Ambulatory Visit: Payer: Self-pay | Admitting: Family Medicine

## 2018-11-15 LAB — BASIC METABOLIC PANEL
BUN/Creatinine Ratio: 12 (calc) (ref 6–22)
BUN: 17 mg/dL (ref 7–25)
CO2: 29 mmol/L (ref 20–32)
Calcium: 9.7 mg/dL (ref 8.6–10.3)
Chloride: 102 mmol/L (ref 98–110)
Creat: 1.4 mg/dL — ABNORMAL HIGH (ref 0.70–1.25)
Glucose, Bld: 136 mg/dL — ABNORMAL HIGH (ref 65–99)
Potassium: 4.3 mmol/L (ref 3.5–5.3)
Sodium: 140 mmol/L (ref 135–146)

## 2018-11-17 ENCOUNTER — Encounter: Payer: Self-pay | Admitting: *Deleted

## 2019-03-06 ENCOUNTER — Ambulatory Visit (INDEPENDENT_AMBULATORY_CARE_PROVIDER_SITE_OTHER): Payer: PPO | Admitting: Family Medicine

## 2019-03-06 ENCOUNTER — Other Ambulatory Visit: Payer: Self-pay

## 2019-03-06 ENCOUNTER — Encounter: Payer: Self-pay | Admitting: Family Medicine

## 2019-03-06 VITALS — BP 110/72 | HR 76 | Temp 97.8°F | Resp 15 | Ht 73.0 in | Wt 281.5 lb

## 2019-03-06 DIAGNOSIS — Z0000 Encounter for general adult medical examination without abnormal findings: Secondary | ICD-10-CM

## 2019-03-06 DIAGNOSIS — Z0001 Encounter for general adult medical examination with abnormal findings: Secondary | ICD-10-CM | POA: Diagnosis not present

## 2019-03-06 DIAGNOSIS — I10 Essential (primary) hypertension: Secondary | ICD-10-CM | POA: Diagnosis not present

## 2019-03-06 DIAGNOSIS — Z6837 Body mass index (BMI) 37.0-37.9, adult: Secondary | ICD-10-CM | POA: Diagnosis not present

## 2019-03-06 DIAGNOSIS — N182 Chronic kidney disease, stage 2 (mild): Secondary | ICD-10-CM | POA: Diagnosis not present

## 2019-03-06 DIAGNOSIS — Z125 Encounter for screening for malignant neoplasm of prostate: Secondary | ICD-10-CM | POA: Diagnosis not present

## 2019-03-06 DIAGNOSIS — E119 Type 2 diabetes mellitus without complications: Secondary | ICD-10-CM

## 2019-03-06 NOTE — Patient Instructions (Signed)
F/U 4 months  We will call with lab results  

## 2019-03-06 NOTE — Progress Notes (Signed)
Subjective:   Patient presents for Medicare Annual/Subsequent preventive examination.    Pt here for wellness exam and f/u chronic medical problems   DM- last A1C 7% in June 2020, CBG at home 120-130  no hypoglycemia ,     -Janumet/ Glipizide  He was out of Janumet for a few weeks but his blood sugar only peaked at 140s he is now getting this back from the drug company.    Feels good,trying to stay active walking some    HTN- Recent  home readings at goal < 130/70 on average   - lisinopril HCTZ   Obesity-his weight is down another 5 pounds intentionally.  CKD- cR 1.2-1.4 Baseline         Review Past Medical/Family/Social: Per EMR    Risk Factors  Current exercise habits: Walks for exercise   Cardiac risk factors: Obesity (BMI >= 30 kg/m2).  Diabetes mellitus hypertension hyperlipidemia  Depression Screen  (Note: if answer to either of the following is "Yes", a more complete depression screening is indicated)  Over the past two weeks, have you felt down, depressed or hopeless? No Over the past two weeks, have you felt little interest or pleasure in doing things? No Have you lost interest or pleasure in daily life? No Do you often feel hopeless? No Do you cry easily over simple problems? No   Activities of Daily Living  In your present state of health, do you have any difficulty performing the following activities?:  Driving? No  Managing money? No  Feeding yourself? No  Getting from bed to chair? No  Climbing a flight of stairs? No  Preparing food and eating?: No  Bathing or showering? No  Getting dressed: No  Getting to the toilet? No  Using the toilet:No  Moving around from place to place: No  In the past year have you fallen or had a near fall?:No  Are you sexually active? Yes Do you have more than one partner? No   Hearing Difficulties: No  Do you often ask people to speak up or repeat themselves? No  Do you experience ringing or noises in your ears?  No Do you have difficulty understanding soft or whispered voices? No  Do you feel that you have a problem with memory? No Do you often misplace items? No  Do you feel safe at home? Yes  Cognitive Testing  Alert? Yes Normal Appearance?Yes  Oriented to person? Yes Place? Yes  Time? Yes  Recall of three objects? Yes  Can perform simple calculations? Yes  Displays appropriate judgment?Yes  Can read the correct time from a watch face?Yes   List the Names of Other Physician/Practitioners you currently use:   My [facility] doctor- [place]  Screening Tests / Date Colonoscopy        UTD DECLINES IMMUNIZATIONS                ROS:  GEN- denies fatigue, fever, weight loss,weakness, recent illness HEENT- denies eye drainage, change in vision, nasal discharge, CVS- denies chest pain, palpitations RESP- denies SOB, cough, wheeze ABD- denies N/V, change in stools, abd pain GU- denies dysuria, hematuria, dribbling, incontinence MSK- denies joint pain, muscle aches, injury Neuro- denies headache, dizziness, syncope, seizure activity  Physical: GEN- NAD, alert and oriented x3 HEENT- PERRL, EOMI, non injected sclera, pink conjunctiva, MMM, oropharynx clear Neck- Supple, no thryomegaly CVS- RRR, no murmur RESP-CTAB ABD-NABS,soft,NT,ND  EXT- No edema Pulses- Radial, DP- 2+    Assessment:    Annual  wellness medicare exam   Plan:    During the course of the visit the patient was educated and counseled about appropriate screening and preventive services including:   Patient declines immunizations.  Diabetes mellitus has been well controlled he continues to work on dietary changes and weight loss.  Goal is an A1c less than 7% continue glipizide and Janumet check his fasting labs today.  He is also on ACE inhibitor.   Hyperlipidemia he declines statin drug at this time he is trying to manage with his diet.  Hypertension well controlled no change in medications.  He will defer  ophthalmology exam until next year in the setting of COVID-19 he is not have any difficulties with his vision.  Discussed PSA screening this will be done in the blood  CKD- recheck renal function today, avoiding NSAIDS as much as possible  Fall/depression/audit C screen neg   Full Code           Diet review for nutrition referral? Yes ____ Not Indicated __x__  Patient Instructions (the written plan) was given to the patient.  Medicare Attestation  I have personally reviewed:  The patient's medical and social history  Their use of alcohol, tobacco or illicit drugs  Their current medications and supplements  The patient's functional ability including ADLs,fall risks, home safety risks, cognitive, and hearing and visual impairment  Diet and physical activities  Evidence for depression or mood disorders  The patient's weight, height, BMI, and visual acuity have been recorded in the chart. I have made referrals, counseling, and provided education to the patient based on review of the above and I have provided the patient with a written personalized care plan for preventive services.

## 2019-03-07 LAB — COMPREHENSIVE METABOLIC PANEL
AG Ratio: 1.7 (calc) (ref 1.0–2.5)
ALT: 13 U/L (ref 9–46)
AST: 13 U/L (ref 10–35)
Albumin: 4.3 g/dL (ref 3.6–5.1)
Alkaline phosphatase (APISO): 50 U/L (ref 35–144)
BUN/Creatinine Ratio: 11 (calc) (ref 6–22)
BUN: 15 mg/dL (ref 7–25)
CO2: 25 mmol/L (ref 20–32)
Calcium: 9.6 mg/dL (ref 8.6–10.3)
Chloride: 97 mmol/L — ABNORMAL LOW (ref 98–110)
Creat: 1.33 mg/dL — ABNORMAL HIGH (ref 0.70–1.25)
Globulin: 2.6 g/dL (calc) (ref 1.9–3.7)
Glucose, Bld: 142 mg/dL — ABNORMAL HIGH (ref 65–99)
Potassium: 3.8 mmol/L (ref 3.5–5.3)
Sodium: 138 mmol/L (ref 135–146)
Total Bilirubin: 0.8 mg/dL (ref 0.2–1.2)
Total Protein: 6.9 g/dL (ref 6.1–8.1)

## 2019-03-07 LAB — CBC WITH DIFFERENTIAL/PLATELET
Absolute Monocytes: 561 cells/uL (ref 200–950)
Basophils Absolute: 40 cells/uL (ref 0–200)
Basophils Relative: 0.6 %
Eosinophils Absolute: 191 cells/uL (ref 15–500)
Eosinophils Relative: 2.9 %
HCT: 44.5 % (ref 38.5–50.0)
Hemoglobin: 14.6 g/dL (ref 13.2–17.1)
Lymphs Abs: 2125 cells/uL (ref 850–3900)
MCH: 28.7 pg (ref 27.0–33.0)
MCHC: 32.8 g/dL (ref 32.0–36.0)
MCV: 87.4 fL (ref 80.0–100.0)
MPV: 11.1 fL (ref 7.5–12.5)
Monocytes Relative: 8.5 %
Neutro Abs: 3683 cells/uL (ref 1500–7800)
Neutrophils Relative %: 55.8 %
Platelets: 218 10*3/uL (ref 140–400)
RBC: 5.09 10*6/uL (ref 4.20–5.80)
RDW: 13.8 % (ref 11.0–15.0)
Total Lymphocyte: 32.2 %
WBC: 6.6 10*3/uL (ref 3.8–10.8)

## 2019-03-07 LAB — LIPID PANEL
Cholesterol: 204 mg/dL — ABNORMAL HIGH (ref <200)
HDL: 44 mg/dL (ref >=40)
LDL Cholesterol (Calc): 134 mg/dL (calc) — ABNORMAL HIGH
Non-HDL Cholesterol (Calc): 160 mg/dL (calc) — ABNORMAL HIGH (ref <130)
Total CHOL/HDL Ratio: 4.6 (calc) (ref <5.0)
Triglycerides: 132 mg/dL (ref <150)

## 2019-03-07 LAB — HEMOGLOBIN A1C
Hgb A1c MFr Bld: 6.5 % of total Hgb — ABNORMAL HIGH (ref <5.7)
Mean Plasma Glucose: 140 (calc)
eAG (mmol/L): 7.7 (calc)

## 2019-03-07 LAB — PSA: PSA: 0.6 ng/mL (ref <=4.0)

## 2019-03-08 ENCOUNTER — Other Ambulatory Visit: Payer: Self-pay

## 2019-03-08 MED ORDER — ATORVASTATIN CALCIUM 20 MG PO TABS: 20.0000 mg | ORAL_TABLET | Freq: Every day | ORAL | 1 refills | Status: DC

## 2019-03-30 ENCOUNTER — Other Ambulatory Visit: Payer: Self-pay | Admitting: Family Medicine

## 2019-04-25 ENCOUNTER — Other Ambulatory Visit: Payer: Self-pay | Admitting: Family Medicine

## 2019-05-02 ENCOUNTER — Other Ambulatory Visit: Payer: Self-pay | Admitting: Family Medicine

## 2019-05-22 ENCOUNTER — Other Ambulatory Visit: Payer: Self-pay

## 2019-05-22 MED ORDER — GLUCOSE BLOOD VI STRP: ORAL_STRIP | 12 refills | Status: DC

## 2019-05-22 MED ORDER — GLIPIZIDE ER 10 MG PO TB24: 10.0000 mg | ORAL_TABLET | Freq: Every day | ORAL | 2 refills | Status: DC

## 2019-05-22 MED ORDER — JANUMET 50-500 MG PO TABS: 1.0000 | ORAL_TABLET | Freq: Two times a day (BID) | ORAL | 1 refills | Status: DC

## 2019-05-22 MED ORDER — FISH OIL 1000 MG PO CAPS: 1.0000 | ORAL_CAPSULE | Freq: Every day | ORAL | 1 refills | Status: AC

## 2019-05-22 MED ORDER — ATORVASTATIN CALCIUM 20 MG PO TABS: ORAL_TABLET | ORAL | 5 refills | Status: DC

## 2019-05-22 MED ORDER — LISINOPRIL-HYDROCHLOROTHIAZIDE 20-25 MG PO TABS: 1.0000 | ORAL_TABLET | Freq: Every day | ORAL | 2 refills | Status: DC

## 2019-06-14 ENCOUNTER — Other Ambulatory Visit: Payer: Self-pay

## 2019-06-14 MED ORDER — OPTUMRX BLOOD GLUCOSE METER W/DEVICE KIT: PACK | 0 refills | Status: DC

## 2019-07-03 ENCOUNTER — Ambulatory Visit: Payer: PPO | Admitting: Family Medicine

## 2019-07-04 ENCOUNTER — Encounter: Payer: Self-pay | Admitting: Family Medicine

## 2019-07-04 ENCOUNTER — Other Ambulatory Visit: Payer: Self-pay

## 2019-07-04 ENCOUNTER — Ambulatory Visit (INDEPENDENT_AMBULATORY_CARE_PROVIDER_SITE_OTHER): Payer: Medicare Other | Admitting: Family Medicine

## 2019-07-04 VITALS — BP 148/82 | HR 80 | Temp 97.6°F | Resp 16 | Wt 285.0 lb

## 2019-07-04 DIAGNOSIS — N182 Chronic kidney disease, stage 2 (mild): Secondary | ICD-10-CM | POA: Diagnosis not present

## 2019-07-04 DIAGNOSIS — I10 Essential (primary) hypertension: Secondary | ICD-10-CM

## 2019-07-04 DIAGNOSIS — E7800 Pure hypercholesterolemia, unspecified: Secondary | ICD-10-CM

## 2019-07-04 DIAGNOSIS — M791 Myalgia, unspecified site: Secondary | ICD-10-CM | POA: Diagnosis not present

## 2019-07-04 DIAGNOSIS — T466X5A Adverse effect of antihyperlipidemic and antiarteriosclerotic drugs, initial encounter: Secondary | ICD-10-CM

## 2019-07-04 DIAGNOSIS — E119 Type 2 diabetes mellitus without complications: Secondary | ICD-10-CM | POA: Diagnosis not present

## 2019-07-04 MED ORDER — LISINOPRIL-HYDROCHLOROTHIAZIDE 20-25 MG PO TABS: 1.0000 | ORAL_TABLET | Freq: Every day | ORAL | 2 refills | Status: DC

## 2019-07-04 MED ORDER — LANCETS MISC: 0 refills | Status: DC

## 2019-07-04 MED ORDER — JANUMET 50-500 MG PO TABS: 1.0000 | ORAL_TABLET | Freq: Two times a day (BID) | ORAL | 1 refills | Status: DC

## 2019-07-04 MED ORDER — GLIPIZIDE ER 10 MG PO TB24: 10.0000 mg | ORAL_TABLET | Freq: Every day | ORAL | 2 refills | Status: DC

## 2019-07-04 NOTE — Patient Instructions (Addendum)
F/U 4 months  We will call with lab results  

## 2019-07-04 NOTE — Assessment & Plan Note (Signed)
Blood pressure mildly elevated today no change in medication he has been monitors blood pressure at home has been normal.

## 2019-07-04 NOTE — Addendum Note (Signed)
Addended by: Sheral Flow on: 07/04/2019 10:22 AM   Modules accepted: Orders

## 2019-07-04 NOTE — Assessment & Plan Note (Addendum)
Check A1c goal is less than 7%.  Continue glipizide and Janumet.  He is unable to tolerate statin drug. Discussed again need for ophthalmology visit he is going to try to set this up by the summer

## 2019-07-04 NOTE — Assessment & Plan Note (Signed)
We will attempt to control with his diet.

## 2019-07-04 NOTE — Progress Notes (Addendum)
   Subjective:    Patient ID: [name] [name], male    DOB: [birth year], 69 y.o.   MRN: [id number]  Patient presents for Follow-up, Diabetes, and Hypertension   DM- last A1C 6.5% in Nov 2020, CBG at home135 this AM, CBG , 1 episode of hypoglycemia, he was working outside and didn't stop to eat /he did water  -Janumet/ Glipizide   Feels good,trying to stay active walking some    HTN- Recent home readings at goal < 130/70 on average  - lisinopril HCTZ   Obesity-weight in Nov 281.5lbs, today  285lbs   CKD- cR 1.2-1.4 Baseline  Hyperlipidemia started on Lipitor 20 mg 3 times a week back in November 2020.  He tried again and it caused palpitations   Lancets needs refilled  30G/0.32MM  Review Of Systems:  GEN- denies fatigue, fever, weight loss,weakness, recent illness HEENT- denies eye drainage, change in vision, nasal discharge, CVS- denies chest pain, palpitations RESP- denies SOB, cough, wheeze ABD- denies N/V, change in stools, abd pain GU- denies dysuria, hematuria, dribbling, incontinence MSK- denies joint pain, muscle aches, injury Neuro- denies headache, dizziness, syncope, seizure activity       Objective:    BP (!) 148/82   Pulse 80   Temp 97.6 F (36.4 C) (Temporal)   Resp 16   Wt 285 lb (129.3 kg)   SpO2 100%   BMI 37.60 kg/m  GEN- NAD, alert and oriented x3 HEENT- PERRL, EOMI, non injected sclera, pink conjunctiva, MMM, oropharynx clear Neck- Supple, no thyromegaly CVS- RRR, no murmur RESP-CTAB ABD-NABS,soft,NT,ND EXT- No edema Pulses- Radial, DP- 2+        Assessment & Plan:      Problem List Items Addressed This Visit      Unprioritized   CKD (chronic kidney disease), stage II   Relevant Orders   COMPLETE METABOLIC PANEL WITH GFR   Diabetes mellitus, type II ([facility])    Check A1c goal is less than 7%.  Continue glipizide and Janumet.  He is unable to tolerate statin drug. Discussed again need for ophthalmology visit he is going to  try to set this up by the summer      Relevant Medications   glipiZIDE (GLUCOTROL XL) 10 MG 24 hr tablet   lisinopril-hydrochlorothiazide (ZESTORETIC) 20-25 MG tablet   sitaGLIPtin-metformin (JANUMET) 50-500 MG tablet   Other Relevant Orders   Hemoglobin A1c   COMPLETE METABOLIC PANEL WITH GFR   HM DIABETES FOOT EXAM (Completed)   Hyperlipidemia    We will attempt to control with his diet.      Relevant Medications   lisinopril-hydrochlorothiazide (ZESTORETIC) 20-25 MG tablet   Other Relevant Orders   Lipid panel   HYPERTENSION, BENIGN - Primary    Blood pressure mildly elevated today no change in medication he has been monitors blood pressure at home has been normal.      Relevant Medications   lisinopril-hydrochlorothiazide (ZESTORETIC) 20-25 MG tablet   Other Relevant Orders   CBC with Differential/Platelet   Lipid panel   Myalgia due to HMG CoA reductase inhibitor      Note: This dictation was prepared with Dragon dictation along with smaller phrase technology. Any transcriptional errors that result from this process are unintentional.

## 2019-07-05 LAB — CBC WITH DIFFERENTIAL/PLATELET
Absolute Monocytes: 539 cells/uL (ref 200–950)
Basophils Absolute: 17 cells/uL (ref 0–200)
Basophils Relative: 0.3 %
Eosinophils Absolute: 220 cells/uL (ref 15–500)
Eosinophils Relative: 3.8 %
HCT: 42 % (ref 38.5–50.0)
Hemoglobin: 14.2 g/dL (ref 13.2–17.1)
Lymphs Abs: 2059 cells/uL (ref 850–3900)
MCH: 29.2 pg (ref 27.0–33.0)
MCHC: 33.8 g/dL (ref 32.0–36.0)
MCV: 86.4 fL (ref 80.0–100.0)
MPV: 10.9 fL (ref 7.5–12.5)
Monocytes Relative: 9.3 %
Neutro Abs: 2964 cells/uL (ref 1500–7800)
Neutrophils Relative %: 51.1 %
Platelets: 202 10*3/uL (ref 140–400)
RBC: 4.86 10*6/uL (ref 4.20–5.80)
RDW: 13.8 % (ref 11.0–15.0)
Total Lymphocyte: 35.5 %
WBC: 5.8 10*3/uL (ref 3.8–10.8)

## 2019-07-05 LAB — COMPLETE METABOLIC PANEL WITH GFR
AG Ratio: 1.4 (calc) (ref 1.0–2.5)
ALT: 12 U/L (ref 9–46)
AST: 14 U/L (ref 10–35)
Albumin: 4.2 g/dL (ref 3.6–5.1)
Alkaline phosphatase (APISO): 49 U/L (ref 35–144)
BUN/Creatinine Ratio: 12 (calc) (ref 6–22)
BUN: 16 mg/dL (ref 7–25)
CO2: 30 mmol/L (ref 20–32)
Calcium: 9.5 mg/dL (ref 8.6–10.3)
Chloride: 101 mmol/L (ref 98–110)
Creat: 1.35 mg/dL — ABNORMAL HIGH (ref 0.70–1.25)
GFR, Est African American: 62 mL/min/{1.73_m2} (ref >=60)
GFR, Est Non African American: 53 mL/min/{1.73_m2} — ABNORMAL LOW (ref >=60)
Globulin: 2.9 g/dL (calc) (ref 1.9–3.7)
Glucose, Bld: 162 mg/dL — ABNORMAL HIGH (ref 65–99)
Potassium: 4 mmol/L (ref 3.5–5.3)
Sodium: 140 mmol/L (ref 135–146)
Total Bilirubin: 0.7 mg/dL (ref 0.2–1.2)
Total Protein: 7.1 g/dL (ref 6.1–8.1)

## 2019-07-05 LAB — LIPID PANEL
Cholesterol: 207 mg/dL — ABNORMAL HIGH (ref <200)
HDL: 46 mg/dL (ref >=40)
LDL Cholesterol (Calc): 138 mg/dL (calc) — ABNORMAL HIGH
Non-HDL Cholesterol (Calc): 161 mg/dL (calc) — ABNORMAL HIGH (ref <130)
Total CHOL/HDL Ratio: 4.5 (calc) (ref <5.0)
Triglycerides: 110 mg/dL (ref <150)

## 2019-07-05 LAB — HEMOGLOBIN A1C
Hgb A1c MFr Bld: 6.8 % of total Hgb — ABNORMAL HIGH (ref <5.7)
Mean Plasma Glucose: 148 (calc)
eAG (mmol/L): 8.2 (calc)

## 2019-07-07 ENCOUNTER — Encounter: Payer: Self-pay | Admitting: *Deleted

## 2019-08-03 ENCOUNTER — Other Ambulatory Visit: Payer: Self-pay | Admitting: Family Medicine

## 2019-08-08 ENCOUNTER — Other Ambulatory Visit: Payer: Self-pay | Admitting: Family Medicine

## 2019-11-06 ENCOUNTER — Other Ambulatory Visit: Payer: Self-pay

## 2019-11-06 ENCOUNTER — Ambulatory Visit (INDEPENDENT_AMBULATORY_CARE_PROVIDER_SITE_OTHER): Payer: Medicare Other | Admitting: Family Medicine

## 2019-11-06 ENCOUNTER — Encounter: Payer: Self-pay | Admitting: Family Medicine

## 2019-11-06 VITALS — BP 138/82 | HR 90 | Temp 98.2°F | Resp 14 | Ht 73.0 in | Wt 288.0 lb

## 2019-11-06 DIAGNOSIS — I10 Essential (primary) hypertension: Secondary | ICD-10-CM | POA: Diagnosis not present

## 2019-11-06 DIAGNOSIS — N182 Chronic kidney disease, stage 2 (mild): Secondary | ICD-10-CM | POA: Diagnosis not present

## 2019-11-06 DIAGNOSIS — Z6838 Body mass index (BMI) 38.0-38.9, adult: Secondary | ICD-10-CM

## 2019-11-06 DIAGNOSIS — E1122 Type 2 diabetes mellitus with diabetic chronic kidney disease: Secondary | ICD-10-CM

## 2019-11-06 DIAGNOSIS — E119 Type 2 diabetes mellitus without complications: Secondary | ICD-10-CM

## 2019-11-06 DIAGNOSIS — I129 Hypertensive chronic kidney disease with stage 1 through stage 4 chronic kidney disease, or unspecified chronic kidney disease: Secondary | ICD-10-CM

## 2019-11-06 MED ORDER — METFORMIN HCL 1000 MG PO TABS: 1000.0000 mg | ORAL_TABLET | Freq: Two times a day (BID) | ORAL | 3 refills | Status: DC

## 2019-11-06 NOTE — Assessment & Plan Note (Signed)
BP looks okay, no changes  

## 2019-11-06 NOTE — Assessment & Plan Note (Signed)
Increase physical activity  Reduce carbs to keep blood sugars under control

## 2019-11-06 NOTE — Assessment & Plan Note (Signed)
Will d/c janumet Continue glipiizde , change to metformin 1000mg  BID due to cost Declines statin drug

## 2019-11-06 NOTE — Progress Notes (Signed)
° °  Subjective:    Patient ID: [name] [name], male    DOB: 09/17/1950, 69 y.o.   MRN: [id number]  Patient presents for Follow-up (is fasting) and Medication Management (unable to afford Janumet)     Pt here to f/u chronic medical problems     DM- he is unable to afford the Janumet, especially being in the [place]   He is taking glipizide 10mg  XL once a day   CBG range   His A1C in March  6.8%   HTN- taking lisinopril HCTZ   Hyperlipidemia- trying to control with diet, taking omega 2     LDL in March was  [place] doctor appt    No new concerns  He has not been exercising as much due to senior center being closed until recently   Review Of Systems:  GEN- denies fatigue, fever, weight loss,weakness, recent illness HEENT- denies eye drainage, change in vision, nasal discharge, CVS- denies chest pain, palpitations RESP- denies SOB, cough, wheeze ABD- denies N/V, change in stools, abd pain GU- denies dysuria, hematuria, dribbling, incontinence MSK- denies joint pain, muscle aches, injury Neuro- denies headache, dizziness, syncope, seizure activity       Objective:    BP 138/82    Pulse 90    Temp 98.2 F (36.8 C) (Temporal)    Resp 14    Ht 6\' 1"  (1.854 m)    Wt 288 lb (130.6 kg)    SpO2 97%    BMI 38.00 kg/m  GEN- NAD, alert and oriented x3 HEENT- PERRL, EOMI, non injected sclera, pink conjunctiva, MMM, oropharynx clear Neck- Supple, no thyromegaly CVS- RRR, no murmur RESP-CTAB ABD-NABS,soft,NT,ND  EXT- No edema Pulses- Radial, DP- 2+        Assessment & Plan:    Needs urine micro at next visit    Problem List Items Addressed This Visit      Unprioritized   CKD (chronic kidney disease), stage II   Relevant Orders   Hemoglobin A1c   CBC with Differential/Platelet   COMPLETE METABOLIC PANEL WITH GFR   Lipid panel   HYPERTENSION, BENIGN    BP looks okay, no changes       Relevant Orders   Hemoglobin A1c   CBC with Differential/Platelet    COMPLETE METABOLIC PANEL WITH GFR   Lipid panel   Obesity    Increase physical activity  Reduce carbs to keep blood sugars under control      Relevant Medications   metFORMIN (GLUCOPHAGE) 1000 MG tablet   Other Relevant Orders   Hemoglobin A1c   CBC with Differential/Platelet   COMPLETE METABOLIC PANEL WITH GFR   Lipid panel   Type 2 DM with CKD stage 2 and hypertension (HCC) - Primary    Will d/c janumet Continue glipiizde , change to metformin 1000mg  BID due to cost Declines statin drug       Relevant Medications   metFORMIN (GLUCOPHAGE) 1000 MG tablet      Note: This dictation was prepared with Dragon dictation along with smaller phrase technology. Any transcriptional errors that result from this process are unintentional.

## 2019-11-06 NOTE — Patient Instructions (Signed)
Stop Janumet Start metformin to 1000mg  twice a day  Continue all other meds F/U 4 months for Physical

## 2019-11-07 LAB — COMPLETE METABOLIC PANEL WITH GFR
AG Ratio: 1.5 (calc) (ref 1.0–2.5)
ALT: 12 U/L (ref 9–46)
AST: 11 U/L (ref 10–35)
Albumin: 4 g/dL (ref 3.6–5.1)
Alkaline phosphatase (APISO): 51 U/L (ref 35–144)
BUN/Creatinine Ratio: 11 (calc) (ref 6–22)
BUN: 14 mg/dL (ref 7–25)
CO2: 29 mmol/L (ref 20–32)
Calcium: 9.3 mg/dL (ref 8.6–10.3)
Chloride: 101 mmol/L (ref 98–110)
Creat: 1.27 mg/dL — ABNORMAL HIGH (ref 0.70–1.18)
GFR, Est African American: 66 mL/min/{1.73_m2} (ref >=60)
GFR, Est Non African American: 57 mL/min/{1.73_m2} — ABNORMAL LOW (ref >=60)
Globulin: 2.6 g/dL (calc) (ref 1.9–3.7)
Glucose, Bld: 161 mg/dL — ABNORMAL HIGH (ref 65–99)
Potassium: 4 mmol/L (ref 3.5–5.3)
Sodium: 138 mmol/L (ref 135–146)
Total Bilirubin: 1.1 mg/dL (ref 0.2–1.2)
Total Protein: 6.6 g/dL (ref 6.1–8.1)

## 2019-11-07 LAB — CBC WITH DIFFERENTIAL/PLATELET
Absolute Monocytes: 524 cells/uL (ref 200–950)
Basophils Absolute: 17 cells/uL (ref 0–200)
Basophils Relative: 0.3 %
Eosinophils Absolute: 188 cells/uL (ref 15–500)
Eosinophils Relative: 3.3 %
HCT: 43.2 % (ref 38.5–50.0)
Hemoglobin: 14.3 g/dL (ref 13.2–17.1)
Lymphs Abs: 1949 cells/uL (ref 850–3900)
MCH: 29.3 pg (ref 27.0–33.0)
MCHC: 33.1 g/dL (ref 32.0–36.0)
MCV: 88.5 fL (ref 80.0–100.0)
MPV: 11.4 fL (ref 7.5–12.5)
Monocytes Relative: 9.2 %
Neutro Abs: 3021 cells/uL (ref 1500–7800)
Neutrophils Relative %: 53 %
Platelets: 203 10*3/uL (ref 140–400)
RBC: 4.88 10*6/uL (ref 4.20–5.80)
RDW: 13.7 % (ref 11.0–15.0)
Total Lymphocyte: 34.2 %
WBC: 5.7 10*3/uL (ref 3.8–10.8)

## 2019-11-07 LAB — LIPID PANEL
Cholesterol: 179 mg/dL (ref <200)
HDL: 41 mg/dL (ref >=40)
LDL Cholesterol (Calc): 105 mg/dL (calc) — ABNORMAL HIGH
Non-HDL Cholesterol (Calc): 138 mg/dL (calc) — ABNORMAL HIGH (ref <130)
Total CHOL/HDL Ratio: 4.4 (calc) (ref <5.0)
Triglycerides: 209 mg/dL — ABNORMAL HIGH (ref <150)

## 2019-11-07 LAB — HEMOGLOBIN A1C
Hgb A1c MFr Bld: 6.9 % of total Hgb — ABNORMAL HIGH (ref <5.7)
Mean Plasma Glucose: 151 (calc)
eAG (mmol/L): 8.4 (calc)

## 2019-11-09 ENCOUNTER — Encounter: Payer: Self-pay | Admitting: *Deleted

## 2020-02-27 ENCOUNTER — Encounter: Payer: Self-pay | Admitting: Family Medicine

## 2020-02-27 ENCOUNTER — Ambulatory Visit (INDEPENDENT_AMBULATORY_CARE_PROVIDER_SITE_OTHER): Payer: Medicare Other | Admitting: Family Medicine

## 2020-02-27 ENCOUNTER — Other Ambulatory Visit: Payer: Self-pay

## 2020-02-27 VITALS — BP 138/74 | HR 74 | Resp 12 | Ht 73.0 in | Wt 291.6 lb

## 2020-02-27 DIAGNOSIS — I129 Hypertensive chronic kidney disease with stage 1 through stage 4 chronic kidney disease, or unspecified chronic kidney disease: Secondary | ICD-10-CM

## 2020-02-27 DIAGNOSIS — Z0000 Encounter for general adult medical examination without abnormal findings: Secondary | ICD-10-CM

## 2020-02-27 DIAGNOSIS — E7800 Pure hypercholesterolemia, unspecified: Secondary | ICD-10-CM

## 2020-02-27 DIAGNOSIS — E1122 Type 2 diabetes mellitus with diabetic chronic kidney disease: Secondary | ICD-10-CM

## 2020-02-27 DIAGNOSIS — Z0001 Encounter for general adult medical examination with abnormal findings: Secondary | ICD-10-CM | POA: Diagnosis not present

## 2020-02-27 DIAGNOSIS — I10 Essential (primary) hypertension: Secondary | ICD-10-CM | POA: Diagnosis not present

## 2020-02-27 DIAGNOSIS — Z125 Encounter for screening for malignant neoplasm of prostate: Secondary | ICD-10-CM

## 2020-02-27 DIAGNOSIS — Z6838 Body mass index (BMI) 38.0-38.9, adult: Secondary | ICD-10-CM

## 2020-02-27 DIAGNOSIS — N182 Chronic kidney disease, stage 2 (mild): Secondary | ICD-10-CM | POA: Diagnosis not present

## 2020-02-27 NOTE — Progress Notes (Signed)
Subjective:   Patient presents for Medicare Annual/Subsequent preventive examination.   Review Past Medical/Family/Social: DM- last A1C 6.9% inJuly 2020, CBG at home110-130'sno hypoglycemia ,  -He has been on metformin 1000mg  twice a day  And Glipizide 10mg    when he eats fruit, his fasting 140-150's . Feels good,trying to stay active walking some    HTN- Recent home readings at goal < 130/70-80's on average  - lisinopril HCTZ   Obesity-weight up 3lbs since last visit CKD- cR 1.2-1.4 Baseline    He did get COVID Vaccine      Review Past Medical/Family/Social: Per EMR    Risk Factors  Current exercise habits: Walks for exercise   Cardiac risk factors: Obesity (BMI >= 30 kg/m2).  Diabetes mellitus hypertension hyperlipidemia  Depression Screen  (Note: if answer to either of the following is "Yes", a more complete depression screening is indicated)  Over the past two weeks, have you felt down, depressed or hopeless? No Over the past two weeks, have you felt little interest or pleasure in doing things? No Have you lost interest or pleasure in daily life? No Do you often feel hopeless? No Do you cry easily over simple problems? No   Activities of Daily Living  In your present state of health, do you have any difficulty performing the following activities?:  Driving? No  Managing money? No  Feeding yourself? No  Getting from bed to chair? No  Climbing a flight of stairs? No  Preparing food and eating?: No  Bathing or showering? No  Getting dressed: No  Getting to the toilet? No  Using the toilet:No  Moving around from place to place: No  In the past year have you fallen or had a near fall?:No  Are you sexually active? Yes Do you have more than one partner? No   Hearing Difficulties: No  Do you often ask people to speak up or repeat themselves? No  Do you experience ringing or noises in your ears? No Do you have difficulty understanding  soft or whispered voices? No  Do you feel that you have a problem with memory? No Do you often misplace items? No  Do you feel safe at home? Yes  Cognitive Testing  Alert? Yes Normal Appearance?Yes  Oriented to person? Yes Place? Yes  Time? Yes  Recall of three objects? Yes  Can perform simple calculations? Yes  Displays appropriate judgment?Yes  Can read the correct time from a watch face?Yes   List the Names of Other Physician/Practitioners you currently use:   My Eye doctor- [place]  Screening Tests / Date Colonoscopy        UTD DECLINES IMMUNIZATIONS                ROS:  GEN- denies fatigue, fever, weight loss,weakness, recent illness HEENT- denies eye drainage, change in vision, nasal discharge, CVS- denies chest pain, palpitations RESP- denies SOB, cough, wheeze ABD- denies N/V, change in stools, abd pain GU- denies dysuria, hematuria, dribbling, incontinence MSK- denies joint pain, muscle aches, injury Neuro- denies headache, dizziness, syncope, seizure activity  Physical: GEN- NAD, alert and oriented x3 HEENT- PERRL, EOMI, non injected sclera, pink conjunctiva, MMM, oropharynx clear Neck- Supple, no thryomegaly, no bruit  CVS- RRR, no murmur RESP-CTAB ABD-NABS,soft,NT,ND  EXT- No edema Pulses- Radial, DP- 2+    Assessment:    Annual wellness medicare exam   Plan:    During the course of the visit the patient was educated and counseled about appropriate screening  and preventive services including:   Patient declines immunizations. He did receive COVID-19 vaccine   Diabetes mellitus has been well controlled he continues to work on dietary changes and weight loss.  Goal is an A1c less than 7% continue glipizide and Metformint check his fasting labs today.  He is also on ACE inhibitor.   Hyperlipidemia he declines statin drug at this time he is trying to manage with his diet.  Hypertension well controlled no change in  medications.  He will defer ophthalmology exam until he feels comfortable going  Discussed PSA screening this will be done in the blood  CKD- recheck renal function today, avoiding NSAIDS as much as possible  Fall/depression/audit C screen neg   Full Code           Diet review for nutrition referral? Yes ____ Not Indicated __x__  Patient Instructions (the written plan) was given to the patient.  Medicare Attestation  I have personally reviewed:  The patient's medical and social history  Their use of alcohol, tobacco or illicit drugs  Their current medications and supplements  The patient's functional ability including ADLs,fall risks, home safety risks, cognitive, and hearing and visual impairment  Diet and physical activities  Evidence for depression or mood disorders  The patient's weight, height, BMI, and visual acuity have been recorded in the chart. I have made referrals, counseling, and provided education to the patient based on review of the above and I have provided the patient with a written personalized care plan for preventive services.

## 2020-02-27 NOTE — Patient Instructions (Signed)
F/U 4 months  

## 2020-02-28 LAB — CBC WITH DIFFERENTIAL/PLATELET
Absolute Monocytes: 562 cells/uL (ref 200–950)
Basophils Absolute: 21 cells/uL (ref 0–200)
Basophils Relative: 0.4 %
Eosinophils Absolute: 213 cells/uL (ref 15–500)
Eosinophils Relative: 4.1 %
HCT: 41.1 % (ref 38.5–50.0)
Hemoglobin: 13.6 g/dL (ref 13.2–17.1)
Lymphs Abs: 2044 cells/uL (ref 850–3900)
MCH: 29.1 pg (ref 27.0–33.0)
MCHC: 33.1 g/dL (ref 32.0–36.0)
MCV: 87.8 fL (ref 80.0–100.0)
MPV: 11.3 fL (ref 7.5–12.5)
Monocytes Relative: 10.8 %
Neutro Abs: 2361 cells/uL (ref 1500–7800)
Neutrophils Relative %: 45.4 %
Platelets: 205 10*3/uL (ref 140–400)
RBC: 4.68 10*6/uL (ref 4.20–5.80)
RDW: 13.8 % (ref 11.0–15.0)
Total Lymphocyte: 39.3 %
WBC: 5.2 10*3/uL (ref 3.8–10.8)

## 2020-02-28 LAB — COMPREHENSIVE METABOLIC PANEL
AG Ratio: 1.6 (calc) (ref 1.0–2.5)
ALT: 17 U/L (ref 9–46)
AST: 15 U/L (ref 10–35)
Albumin: 4.1 g/dL (ref 3.6–5.1)
Alkaline phosphatase (APISO): 46 U/L (ref 35–144)
BUN/Creatinine Ratio: 13 (calc) (ref 6–22)
BUN: 18 mg/dL (ref 7–25)
CO2: 28 mmol/L (ref 20–32)
Calcium: 9.3 mg/dL (ref 8.6–10.3)
Chloride: 100 mmol/L (ref 98–110)
Creat: 1.34 mg/dL — ABNORMAL HIGH (ref 0.70–1.18)
Globulin: 2.6 g/dL (calc) (ref 1.9–3.7)
Glucose, Bld: 157 mg/dL — ABNORMAL HIGH (ref 65–99)
Potassium: 4.1 mmol/L (ref 3.5–5.3)
Sodium: 138 mmol/L (ref 135–146)
Total Bilirubin: 0.8 mg/dL (ref 0.2–1.2)
Total Protein: 6.7 g/dL (ref 6.1–8.1)

## 2020-02-28 LAB — LIPID PANEL
Cholesterol: 193 mg/dL (ref <200)
HDL: 45 mg/dL (ref >=40)
LDL Cholesterol (Calc): 120 mg/dL (calc) — ABNORMAL HIGH
Non-HDL Cholesterol (Calc): 148 mg/dL (calc) — ABNORMAL HIGH (ref <130)
Total CHOL/HDL Ratio: 4.3 (calc) (ref <5.0)
Triglycerides: 165 mg/dL — ABNORMAL HIGH (ref <150)

## 2020-02-28 LAB — PSA: PSA: 0.56 ng/mL (ref <=4.0)

## 2020-02-28 LAB — HEMOGLOBIN A1C
Hgb A1c MFr Bld: 7.2 % of total Hgb — ABNORMAL HIGH (ref <5.7)
Mean Plasma Glucose: 160 (calc)
eAG (mmol/L): 8.9 (calc)

## 2020-03-21 ENCOUNTER — Other Ambulatory Visit: Payer: Self-pay | Admitting: Family Medicine

## 2020-03-26 ENCOUNTER — Other Ambulatory Visit: Payer: Self-pay | Admitting: Family Medicine

## 2020-06-26 ENCOUNTER — Ambulatory Visit (INDEPENDENT_AMBULATORY_CARE_PROVIDER_SITE_OTHER): Payer: Medicare Other | Admitting: Family Medicine

## 2020-06-26 ENCOUNTER — Other Ambulatory Visit: Payer: Self-pay

## 2020-06-26 ENCOUNTER — Encounter: Payer: Self-pay | Admitting: Family Medicine

## 2020-06-26 VITALS — BP 122/80 | HR 81 | Temp 97.4°F | Resp 18 | Ht 73.0 in | Wt 288.5 lb

## 2020-06-26 DIAGNOSIS — M791 Myalgia, unspecified site: Secondary | ICD-10-CM | POA: Diagnosis not present

## 2020-06-26 DIAGNOSIS — Z6838 Body mass index (BMI) 38.0-38.9, adult: Secondary | ICD-10-CM

## 2020-06-26 DIAGNOSIS — N182 Chronic kidney disease, stage 2 (mild): Secondary | ICD-10-CM | POA: Diagnosis not present

## 2020-06-26 DIAGNOSIS — I129 Hypertensive chronic kidney disease with stage 1 through stage 4 chronic kidney disease, or unspecified chronic kidney disease: Secondary | ICD-10-CM | POA: Diagnosis not present

## 2020-06-26 DIAGNOSIS — E7800 Pure hypercholesterolemia, unspecified: Secondary | ICD-10-CM

## 2020-06-26 DIAGNOSIS — E1122 Type 2 diabetes mellitus with diabetic chronic kidney disease: Secondary | ICD-10-CM | POA: Diagnosis not present

## 2020-06-26 DIAGNOSIS — T466X5A Adverse effect of antihyperlipidemic and antiarteriosclerotic drugs, initial encounter: Secondary | ICD-10-CM

## 2020-06-26 DIAGNOSIS — I10 Essential (primary) hypertension: Secondary | ICD-10-CM

## 2020-06-26 MED ORDER — METFORMIN HCL 1000 MG PO TABS: 1000.0000 mg | ORAL_TABLET | Freq: Two times a day (BID) | ORAL | 3 refills | Status: DC

## 2020-06-26 MED ORDER — GLIPIZIDE ER 10 MG PO TB24
10.0000 mg | ORAL_TABLET | Freq: Every day | ORAL | 3 refills | Status: DC
Start: 2020-06-26 — End: 2021-05-20

## 2020-06-26 MED ORDER — LISINOPRIL-HYDROCHLOROTHIAZIDE 20-25 MG PO TABS: 1.0000 | ORAL_TABLET | Freq: Every day | ORAL | 3 refills | Status: DC

## 2020-06-26 NOTE — Assessment & Plan Note (Signed)
Controlled, no changes. 

## 2020-06-26 NOTE — Progress Notes (Signed)
   Subjective:    Patient ID: [name] [name], male    DOB: 23-Jan-1951, 70 y.o.   MRN: [id number]  Patient presents for Follow-up (Cbg:@ home today 147.) and Medication Refill  DM- last A1C 7.2% inJuly 2020, CBG at home110-130'sno hypoglycemia ,  -He has been on metformin 1000mg  twice a day  And Glipizide 10mg    when he eats fruit, his fasting , average  140-145 fasting . Feels good,trying to stay active walking some    HTN- Recent home readingsat goal < 130/70-80's on average - lisinopril HCTZ   Obesity-weight up 3lbs since last visit CKD- cR 1.2-1.4 Baseline  [facility] doctor   [facility] Review Of Systems:  GEN- denies fatigue, fever, weight loss,weakness, recent illness HEENT- denies eye drainage, change in vision, nasal discharge, CVS- denies chest pain, palpitations RESP- denies SOB, cough, wheeze ABD- denies N/V, change in stools, abd pain GU- denies dysuria, hematuria, dribbling, incontinence MSK- denies joint pain, muscle aches, injury Neuro- denies headache, dizziness, syncope, seizure activity       Objective:    BP 122/80 (BP Location: Left Arm, Patient Position: Sitting, Cuff Size: Normal)   Pulse 81   Temp (!) 97.4 F (36.3 C) (Temporal)   Resp 18   Ht 6\' 1"  (1.854 m)   Wt 288 lb 8 oz (130.9 kg)   SpO2 99%   BMI 38.06 kg/m  GEN- NAD, alert and oriented x3 HEENT- PERRL, EOMI, non injected sclera, pink conjunctiva, MMM, oropharynx clear Neck- Supple, no thyromegaly CVS- RRR, no murmur RESP-CTAB ABD-NABS,soft,NT,ND EXT- No edema Pulses- Radial, DP- 2+        Assessment & Plan:      Problem List Items Addressed This Visit      Unprioritized   CKD (chronic kidney disease), stage II   Hyperlipidemia   Relevant Medications   lisinopril-hydrochlorothiazide (ZESTORETIC) 20-25 MG tablet   HYPERTENSION, BENIGN    Controlled, no changes      Relevant Medications   lisinopril-hydrochlorothiazide (ZESTORETIC) 20-25 MG tablet   Other  Relevant Orders   CBC with Differential/Platelet   Comprehensive metabolic panel   Myalgia due to HMG CoA reductase inhibitor   Obesity   Relevant Medications   glipiZIDE (GLUCOTROL XL) 10 MG 24 hr tablet   metFORMIN (GLUCOPHAGE) 1000 MG tablet   Type 2 DM with CKD stage 2 and hypertension (HCC) - Primary    cbg improved, continue oral meds Cost of meds is an issue Intolerant of statins Goal A1C less than 8 %      Relevant Medications   glipiZIDE (GLUCOTROL XL) 10 MG 24 hr tablet   lisinopril-hydrochlorothiazide (ZESTORETIC) 20-25 MG tablet   metFORMIN (GLUCOPHAGE) 1000 MG tablet   Other Relevant Orders   CBC with Differential/Platelet   Comprehensive metabolic panel   Lipid panel   Hemoglobin A1c   HM DIABETES FOOT EXAM (Completed)   Microalbumin / creatinine urine ratio      Note: This dictation was prepared with Dragon dictation along with smaller phrase technology. Any transcriptional errors that result from this process are unintentional.

## 2020-06-26 NOTE — Assessment & Plan Note (Signed)
cbg improved, continue oral meds Cost of meds is an issue Intolerant of statins Goal A1C less than 8 %

## 2020-06-27 LAB — COMPREHENSIVE METABOLIC PANEL
AG Ratio: 1.7 (calc) (ref 1.0–2.5)
ALT: 29 U/L (ref 9–46)
AST: 22 U/L (ref 10–35)
Albumin: 4.3 g/dL (ref 3.6–5.1)
Alkaline phosphatase (APISO): 51 U/L (ref 35–144)
BUN: 12 mg/dL (ref 7–25)
CO2: 29 mmol/L (ref 20–32)
Calcium: 9.5 mg/dL (ref 8.6–10.3)
Chloride: 102 mmol/L (ref 98–110)
Creat: 1.12 mg/dL (ref 0.70–1.18)
Globulin: 2.6 g/dL (calc) (ref 1.9–3.7)
Glucose, Bld: 177 mg/dL — ABNORMAL HIGH (ref 65–99)
Potassium: 4.1 mmol/L (ref 3.5–5.3)
Sodium: 141 mmol/L (ref 135–146)
Total Bilirubin: 0.6 mg/dL (ref 0.2–1.2)
Total Protein: 6.9 g/dL (ref 6.1–8.1)

## 2020-06-27 LAB — HEMOGLOBIN A1C
Hgb A1c MFr Bld: 7.6 % of total Hgb — ABNORMAL HIGH (ref <5.7)
Mean Plasma Glucose: 171 mg/dL
eAG (mmol/L): 9.5 mmol/L

## 2020-06-27 LAB — CBC WITH DIFFERENTIAL/PLATELET
Absolute Monocytes: 509 cells/uL (ref 200–950)
Basophils Absolute: 29 cells/uL (ref 0–200)
Basophils Relative: 0.6 %
Eosinophils Absolute: 288 cells/uL (ref 15–500)
Eosinophils Relative: 6 %
HCT: 41.5 % (ref 38.5–50.0)
Hemoglobin: 13.9 g/dL (ref 13.2–17.1)
Lymphs Abs: 1848 cells/uL (ref 850–3900)
MCH: 29.2 pg (ref 27.0–33.0)
MCHC: 33.5 g/dL (ref 32.0–36.0)
MCV: 87.2 fL (ref 80.0–100.0)
MPV: 10.8 fL (ref 7.5–12.5)
Monocytes Relative: 10.6 %
Neutro Abs: 2126 cells/uL (ref 1500–7800)
Neutrophils Relative %: 44.3 %
Platelets: 239 10*3/uL (ref 140–400)
RBC: 4.76 10*6/uL (ref 4.20–5.80)
RDW: 13.4 % (ref 11.0–15.0)
Total Lymphocyte: 38.5 %
WBC: 4.8 10*3/uL (ref 3.8–10.8)

## 2020-06-27 LAB — LIPID PANEL
Cholesterol: 198 mg/dL (ref <200)
HDL: 45 mg/dL (ref >=40)
LDL Cholesterol (Calc): 127 mg/dL (calc) — ABNORMAL HIGH
Non-HDL Cholesterol (Calc): 153 mg/dL (calc) — ABNORMAL HIGH (ref <130)
Total CHOL/HDL Ratio: 4.4 (calc) (ref <5.0)
Triglycerides: 144 mg/dL (ref <150)

## 2020-06-27 LAB — MICROALBUMIN / CREATININE URINE RATIO
Creatinine, Urine: 182 mg/dL (ref 20–320)
Microalb Creat Ratio: 13 mcg/mg creat (ref <30)
Microalb, Ur: 2.3 mg/dL

## 2020-07-10 ENCOUNTER — Encounter: Payer: Self-pay | Admitting: *Deleted

## 2020-07-10 NOTE — Telephone Encounter (Signed)
This encounter was created in error - please disregard.

## 2020-07-15 ENCOUNTER — Telehealth: Payer: Self-pay

## 2020-07-15 ENCOUNTER — Encounter: Payer: Self-pay | Admitting: *Deleted

## 2020-07-15 MED ORDER — DAPAGLIFLOZIN PROPANEDIOL 10 MG PO TABS: 10.0000 mg | ORAL_TABLET | Freq: Every day | ORAL | 3 refills | Status: DC

## 2020-07-15 NOTE — Telephone Encounter (Signed)
Patient called and asked to have the lab mailed to him.

## 2020-07-18 ENCOUNTER — Other Ambulatory Visit: Payer: Self-pay

## 2020-07-18 ENCOUNTER — Ambulatory Visit (INDEPENDENT_AMBULATORY_CARE_PROVIDER_SITE_OTHER): Payer: Medicare Other | Admitting: Nurse Practitioner

## 2020-07-18 ENCOUNTER — Encounter (INDEPENDENT_AMBULATORY_CARE_PROVIDER_SITE_OTHER): Payer: Self-pay | Admitting: Nurse Practitioner

## 2020-07-18 VITALS — BP 132/74 | HR 88 | Temp 96.7°F | Ht 70.0 in | Wt 289.8 lb

## 2020-07-18 DIAGNOSIS — I129 Hypertensive chronic kidney disease with stage 1 through stage 4 chronic kidney disease, or unspecified chronic kidney disease: Secondary | ICD-10-CM | POA: Diagnosis not present

## 2020-07-18 DIAGNOSIS — N182 Chronic kidney disease, stage 2 (mild): Secondary | ICD-10-CM | POA: Diagnosis not present

## 2020-07-18 DIAGNOSIS — I10 Essential (primary) hypertension: Secondary | ICD-10-CM

## 2020-07-18 DIAGNOSIS — E7800 Pure hypercholesterolemia, unspecified: Secondary | ICD-10-CM | POA: Diagnosis not present

## 2020-07-18 DIAGNOSIS — E1122 Type 2 diabetes mellitus with diabetic chronic kidney disease: Secondary | ICD-10-CM

## 2020-07-18 NOTE — Progress Notes (Signed)
Subjective:  Patient ID: [name] [name], male    DOB: 02-09-1951  Age: 70 y.o. MRN: [id number]  CC:  Chief Complaint  Patient presents with  . Establish Care    Doing well, on metformin and may want to discuss Janumet      HPI  This patient arrives today to establish care.  His previous primary provider provider is leaving the area so he is here to establish care with a new primary care provider.  Type 2 diabetes: He continues on Metformin 1000 mg twice a day and glipizide 10 mg daily.  He does monitor his blood sugars at home.  Last A1c was collected earlier this month and it was 7.2.  It appears that a provider had ordered for [employer] for the patient to start, but he tells me he never started this and was not aware of the prescription.  He has been on Janumet in the past and has tolerated it well however it is and expensive medication.  He wants to know if he should restart Januvia.  Per chart review I do see that he had his urine checked for albuminuria and this was negative.  Hypertension: He continues on lisinopril and hydrochlorothiazide and is tolerating his medications well.  Hyperlipidemia: He has experienced intolerance to statin therapy in the past and is not on any statins currently.  He is on omega-3 fish oil.  Last lipid panel was collected about 1 month ago and showed LDL 127 and triglycerides of 144.    Past Medical History:  Diagnosis Date  . Diabetes mellitus   . Hyperlipidemia   . Hypertension   . Renal cyst 2013   Left hemorrhagic hilar cyst      Family History  Problem Relation Age of Onset  . Diabetes Sister   . Hypertension Sister   . Diabetes Sister     Social History   Social History Narrative  . Not on file   Social History   Tobacco Use  . Smoking status: Former Research scientist (life sciences)  . Smokeless tobacco: Never Used  Substance Use Topics  . Alcohol use: No    Alcohol/week: 0.0 standard drinks     Current Meds  Medication Sig  .  CALCIUM-MAGNESIUM-ZINC PO Take by mouth.  . Cholecalciferol (VITAMIN D3) 1000 units CAPS Take 1 capsule by mouth daily.  . Cinnamon 500 MG capsule Take 500 mg by mouth daily as needed.  Marland Kitchen glipiZIDE (GLUCOTROL XL) 10 MG 24 hr tablet Take 1 tablet (10 mg total) by mouth daily with breakfast.  . lisinopril-hydrochlorothiazide (ZESTORETIC) 20-25 MG tablet Take 1 tablet by mouth daily.  . metFORMIN (GLUCOPHAGE) 1000 MG tablet Take 1 tablet (1,000 mg total) by mouth 2 (two) times daily with a meal.  . Multiple Vitamin (MULTIVITAMIN) capsule Take 1 capsule by mouth daily.  . Omega-3 Fatty Acids (FISH OIL) 1000 MG CAPS Take 1 capsule (1,000 mg total) by mouth daily.  . vitamin B-12 (CYANOCOBALAMIN) 1000 MCG tablet Take 1,000 mcg by mouth daily.    ROS:  Review of Systems  Eyes: Negative for blurred vision.  Respiratory: Negative for shortness of breath.   Cardiovascular: Negative for chest pain.  Neurological: Negative for dizziness and headaches.     Objective:   Today's Vitals: BP 132/74   Pulse 88   Temp (!) 96.7 F (35.9 C) (Temporal)   Ht 5\' 10"  (1.778 m)   Wt 289 lb 12.8 oz (131.5 kg)   SpO2 94%   BMI  41.58 kg/m  Vitals with BMI 07/18/2020 06/26/2020 02/27/2020  Height 5\' 10"  6\' 1"  6\' 1"   Weight 289 lbs 13 oz 288 lbs 8 oz 291 lbs 10 oz  BMI 41.58 86.75 44.92  Systolic 010 071 219  Diastolic 74 80 74  Pulse 88 81 74     Physical Exam Vitals reviewed.  Constitutional:      Appearance: Normal appearance.  HENT:     Head: Normocephalic and atraumatic.  Cardiovascular:     Rate and Rhythm: Normal rate and regular rhythm.  Pulmonary:     Effort: Pulmonary effort is normal.     Breath sounds: Normal breath sounds.  Musculoskeletal:     Cervical back: Neck supple.  Skin:    General: Skin is warm and dry.  Neurological:     Mental Status: He is alert and oriented to person, place, and time.  Psychiatric:        Mood and Affect: Mood normal.        Behavior: Behavior  normal.        Thought Content: Thought content normal.        Judgment: Judgment normal.          Assessment and Plan   1. Type 2 DM with CKD stage 2 and hypertension ([place])   2. Pure hypercholesterolemia   3. HYPERTENSION, BENIGN      Plan: 1.  He had a shared decision making conversation in determine that he will stay on his Metformin and glipizide and will try the [place], as opposed to restarting the [place].  We did discuss potential negative side effects to [place] including increased urination, hypoglycemia, Fournier's gangrene, increased risk for UTI, increased risk for yeast infections.  We did discuss signs and symptoms of these potential side effects and what to do if these were to occur.  He did express his understanding.  He will be due to have his A1c rechecked at next office visit.  He was also encouraged to continue monitoring his blood sugar and to let us know if he has any hypoglycemic events. 2.  He will continue on his omega-3 supplement for now. 3.  He will continue on his antihypertensives.   Tests ordered No orders of the defined types were placed in this encounter.     No orders of the defined types were placed in this encounter.   Patient to follow-up in 3 months or sooner as needed.  Ailene Ards, NP

## 2020-08-07 ENCOUNTER — Telehealth (INDEPENDENT_AMBULATORY_CARE_PROVIDER_SITE_OTHER): Payer: Self-pay

## 2020-08-07 ENCOUNTER — Encounter (INDEPENDENT_AMBULATORY_CARE_PROVIDER_SITE_OTHER): Payer: Self-pay | Admitting: Nurse Practitioner

## 2020-08-07 NOTE — Telephone Encounter (Signed)
Patient called and left a detailed voice message to call back.  I called the patient back and he stated that he is not able to take the Metformin anymore because it is causing him to have severe diarrhea and extremely watery diarrhea at least 5 times per day. Patient would like to go back on the Janumet and please sent to [facility] for the patient per the patient.  Please advise.

## 2020-08-07 NOTE — Progress Notes (Signed)
This encounter was created in error - please disregard.

## 2020-08-07 NOTE — Telephone Encounter (Signed)
Will you see if we can schedule him an tele appointment with me or Dr. Darnell Level to discuss this in the next couple of weeks? Only because he was recently started on farxiga and if we discontinue the metformin and restart janumet this may result in increased risk for hypoglycemia so I want to make sure this is discussed so he understands what to look out for.

## 2020-08-07 NOTE — Telephone Encounter (Signed)
Called patient and gave him the message. Patient stated that he never picked up the [place] due to the cost of the medication. Patient has stopped the Metformin. Patient scheduled a telephone visit with you on 08/14/2020 at 9:15am to discuss.

## 2020-08-14 ENCOUNTER — Encounter (INDEPENDENT_AMBULATORY_CARE_PROVIDER_SITE_OTHER): Payer: Self-pay | Admitting: Nurse Practitioner

## 2020-08-14 ENCOUNTER — Telehealth (INDEPENDENT_AMBULATORY_CARE_PROVIDER_SITE_OTHER): Payer: Medicare Other | Admitting: Nurse Practitioner

## 2020-08-14 VITALS — BP 135/71 | HR 73 | Ht 70.0 in

## 2020-08-14 DIAGNOSIS — N182 Chronic kidney disease, stage 2 (mild): Secondary | ICD-10-CM

## 2020-08-14 DIAGNOSIS — E1122 Type 2 diabetes mellitus with diabetic chronic kidney disease: Secondary | ICD-10-CM

## 2020-08-14 DIAGNOSIS — I129 Hypertensive chronic kidney disease with stage 1 through stage 4 chronic kidney disease, or unspecified chronic kidney disease: Secondary | ICD-10-CM

## 2020-08-14 MED ORDER — JANUMET XR 100-1000 MG PO TB24
1.0000 | ORAL_TABLET | Freq: Every day | ORAL | 0 refills | Status: DC
Start: 2020-08-14 — End: 2020-08-14

## 2020-08-14 MED ORDER — JANUMET XR 100-1000 MG PO TB24: 1.0000 | ORAL_TABLET | Freq: Every day | ORAL | 1 refills | Status: DC

## 2020-08-14 NOTE — Progress Notes (Signed)
Due to national recommendations of social distancing related to the [place] pandemic, an audio-only tele-health visit was felt to be the most appropriate encounter type for this patient today. I connected with  [name] [name] on 08/14/20 utilizing audio-only technology and verified that I am speaking with the correct person using two identifiers. The patient was located at their home, and I was located at the office of [facility] during the encounter. I discussed the limitations of evaluation and management by telemedicine. The patient expressed understanding and agreed to proceed.     Subjective:  Patient ID: [name] [name], male    DOB: 01/13/1951  Age: 70 y.o. MRN: [id number]  CC:  Chief Complaint  Patient presents with  . Follow-up    Wants to go back on Janumet, not able to take Metformin, did not pick up [place] due to cost      HPI  This patient arrives today for a virtual visit for the above.  At last office visit we had made some medication adjustments and continued him on Farxiga and metformin.  Since that appointment he has called office requesting that we reorder his [place].  I did not want to make these changes without speaking to the patient as I wanted to ensure we were on the same page regarding which medications he was taking to prevent any hypoglycemic events.  Today, he tells me that he is not able to tolerate just metformin as he feels that his blood sugars are way too labile on the metformin alone.  He also tells me he never ended up filling the [place] because the cost was too high.  He also continues on extended release glipizide.  Blood sugar today was 148.  Past Medical History:  Diagnosis Date  . Diabetes mellitus   . Hyperlipidemia   . Hypertension   . Renal cyst 2013   Left hemorrhagic hilar cyst      Family History  Problem Relation Age of Onset  . Diabetes Sister   . Hypertension Sister   . Diabetes Sister     Social History   Social  History Narrative  . Not on file   Social History   Tobacco Use  . Smoking status: Former Research scientist (life sciences)  . Smokeless tobacco: Never Used  Substance Use Topics  . Alcohol use: No    Alcohol/week: 0.0 standard drinks     Current Meds  Medication Sig  . CALCIUM-MAGNESIUM-ZINC PO Take by mouth.  . Cholecalciferol (VITAMIN D3) 1000 units CAPS Take 1 capsule by mouth daily.  . Cinnamon 500 MG capsule Take 500 mg by mouth daily as needed.  Marland Kitchen glipiZIDE (GLUCOTROL XL) 10 MG 24 hr tablet Take 1 tablet (10 mg total) by mouth daily with breakfast.  . lisinopril-hydrochlorothiazide (ZESTORETIC) 20-25 MG tablet Take 1 tablet by mouth daily.  . Multiple Vitamin (MULTIVITAMIN) capsule Take 1 capsule by mouth daily.  . Omega-3 Fatty Acids (FISH OIL) 1000 MG CAPS Take 1 capsule (1,000 mg total) by mouth daily.  . SitaGLIPtin-MetFORMIN HCl (JANUMET XR) [phone] MG TB24 Take 1 tablet by mouth daily.  . vitamin B-12 (CYANOCOBALAMIN) 1000 MCG tablet Take 1,000 mcg by mouth daily.  . [DISCONTINUED] metFORMIN (GLUCOPHAGE) 1000 MG tablet Take 1 tablet (1,000 mg total) by mouth 2 (two) times daily with a meal.  . [DISCONTINUED] SitaGLIPtin-MetFORMIN HCl (JANUMET XR) [phone] MG TB24 Take 1 tablet by mouth daily.    ROS:  No significant concerns reported  Objective:   Today's Vitals:  BP 135/71   Pulse 73   Ht 5\' 10"  (1.778 m)   BMI 41.58 kg/m  Vitals with BMI 08/14/2020 07/18/2020 06/26/2020  Height 5\' 10"  5\' 10"  6\' 1"   Weight (No Data) 289 lbs 13 oz 288 lbs 8 oz  BMI - 57.84 69.62  Systolic 952 841 324  Diastolic 71 74 80  Pulse 73 88 81     Physical Exam Comprehensive physical exam not completed today as office visit was conducted remotely.  Patient sounded well over the phone.  Patient was alert and oriented, and appeared to have appropriate judgment.       Assessment and Plan   1. Type 2 DM with CKD stage 2 and hypertension ([place])      Plan: 1.  I will discontinue metformin and Farxiga.   Patient will continue on glipizide.  Will reorder Janumet extended release [phone]/tab and he will take 1 tablet by mouth daily.  He will follow-up as scheduled in June or sooner as needed.   Tests ordered No orders of the defined types were placed in this encounter.     Meds ordered this encounter  Medications  . DISCONTD: SitaGLIPtin-MetFORMIN HCl (JANUMET XR) [phone] MG TB24    Sig: Take 1 tablet by mouth daily.    Dispense:  30 tablet    Refill:  0    Order Specific Question:   Supervising Provider    Answer:   Hurshel Party C [[id number]]  . SitaGLIPtin-MetFORMIN HCl (JANUMET XR) [phone] MG TB24    Sig: Take 1 tablet by mouth daily.    Dispense:  90 tablet    Refill:  1    Order Specific Question:   Supervising Provider    Answer:   Doree Albee [[id number]]    Patient to follow-up as scheduled in June or sooner as needed.  Total time spent the telephone today with this patient was 3 minutes and 30 seconds.  Ailene Ards, NP

## 2020-10-04 ENCOUNTER — Other Ambulatory Visit (INDEPENDENT_AMBULATORY_CARE_PROVIDER_SITE_OTHER): Payer: Self-pay | Admitting: Nurse Practitioner

## 2020-10-04 DIAGNOSIS — N182 Chronic kidney disease, stage 2 (mild): Secondary | ICD-10-CM

## 2020-10-16 ENCOUNTER — Other Ambulatory Visit: Payer: Self-pay

## 2020-10-16 ENCOUNTER — Encounter (INDEPENDENT_AMBULATORY_CARE_PROVIDER_SITE_OTHER): Payer: Self-pay | Admitting: Internal Medicine

## 2020-10-16 ENCOUNTER — Ambulatory Visit (INDEPENDENT_AMBULATORY_CARE_PROVIDER_SITE_OTHER): Payer: Medicare Other | Admitting: Internal Medicine

## 2020-10-16 VITALS — BP 144/71 | HR 67 | Temp 97.9°F | Ht 70.0 in | Wt 289.0 lb

## 2020-10-16 DIAGNOSIS — I129 Hypertensive chronic kidney disease with stage 1 through stage 4 chronic kidney disease, or unspecified chronic kidney disease: Secondary | ICD-10-CM

## 2020-10-16 DIAGNOSIS — E1122 Type 2 diabetes mellitus with diabetic chronic kidney disease: Secondary | ICD-10-CM

## 2020-10-16 DIAGNOSIS — N182 Chronic kidney disease, stage 2 (mild): Secondary | ICD-10-CM | POA: Diagnosis not present

## 2020-10-16 DIAGNOSIS — I10 Essential (primary) hypertension: Secondary | ICD-10-CM

## 2020-10-16 NOTE — Patient Instructions (Signed)
[name] Dietary Recommendations for Weight Loss What to Avoid Avoid added sugars Often added sugar can be found in processed foods such as many condiments, dry cereals, cakes, cookies, chips, crisps, crackers, candies, sweetened drinks, etc.  Read labels and AVOID/DECREASE use of foods with the following in their ingredient list: Sugar, fructose, high fructose corn syrup, sucrose, glucose, maltose, dextrose, molasses, cane sugar, brown sugar, any type of syrup, agave nectar, etc.   Avoid snacking in between meals Avoid foods made with flour If you are going to eat food made with flour, choose those made with whole-grains; and, minimize your consumption as much as is tolerable Avoid processed foods These foods are generally stocked in the middle of the grocery store. Focus on shopping on the perimeter of the grocery.  Avoid Meat  We recommend following a plant-based diet at [facility]. Thus, we recommend avoiding meat as a general rule. Consider eating beans, legumes, eggs, and/or dairy products for regular protein sources If you plan on eating meat limit to 4 ounces of meat at a time and choose lean options such as Fish, chicken, [place]. Avoid red meat intake such as pork and/or steak What to Include Vegetables GREEN LEAFY VEGETABLES: Kale, spinach, mustard greens, collard greens, cabbage, broccoli, etc. OTHER: Asparagus, cauliflower, eggplant, carrots, peas, Brussel sprouts, tomatoes, bell peppers, zucchini, beets, cucumbers, etc. Grains, seeds, and legumes Beans: kidney beans, black eyed peas, garbanzo beans, black beans, pinto beans, etc. Whole, unrefined grains: brown rice, barley, bulgur, oatmeal, etc. Healthy fats  Avoid highly processed fats such as vegetable oil Examples of healthy fats: avocado, olives, virgin olive oil, dark chocolate (?72% Cocoa), nuts (peanuts, almonds, walnuts, cashews, pecans, etc.) None to Low Intake of Animal Sources of Protein Meat  sources: chicken, [place], salmon, tuna. Limit to 4 ounces of meat at one time. Consider limiting dairy sources, but when choosing dairy focus on: PLAIN [place] yogurt, cottage cheese, high-protein milk Fruit Choose berries  When to Eat Intermittent Fasting: Choosing not to eat for a specific time period, but DO FOCUS ON HYDRATION when fasting Multiple Techniques: Time Restricted Eating: eat 3 meals in a day, each meal lasting no more than 60 minutes, no snacks between meals 16-18 hour fast: fast for 16 to 18 hours up to 7 days a week. Often suggested to start with 2-3 nonconsecutive days per week.  Remember the time you sleep is counted as fasting.  Examples of eating schedule: Fast from 7:00pm-11:00am. Eat between 11:00am-7:00pm.  24-hour fast: fast for 24 hours up to every other day. Often suggested to start with 1 day per week Remember the time you sleep is counted as fasting Examples of eating schedule:  Eating day: eat 2-3 meals on your eating day. If doing 2 meals, each meal should last no more than 90 minutes. If doing 3 meals, each meal should last no more than 60 minutes. Finish last meal by 7:00pm. Fasting day: Fast until 7:00pm.  IF YOU FEEL UNWELL FOR ANY REASON/IN ANY WAY WHEN FASTING, STOP FASTING BY EATING A NUTRITIOUS SNACK OR LIGHT MEAL ALWAYS FOCUS ON HYDRATION DURING FASTS Acceptable Hydration sources: water, broths, tea/coffee (black tea/coffee is best but using a small amount of whole-fat dairy products in coffee/tea is acceptable).  Poor Hydration Sources: anything with sugar or artificial sweeteners added to it  These recommendations have been developed for patients that are actively receiving medical care from either Dr. Anastasio Champion or Jeralyn Ruths, DNP, NP-C at [facility] These recommendations  are developed for patients with specific medical conditions and are not meant to be distributed or used by others that are not actively receiving care from either provider  listed above at [facility]. It is not appropriate to participate in the above eating plans without proper medical supervision.   Reference: Rexanne Mano. The obesity code. [place]; 2016.

## 2020-10-16 NOTE — Progress Notes (Signed)
Metrics: Intervention Frequency ACO  Documented Smoking Status Yearly  Screened one or more times in 24 months  Cessation Counseling or  Active cessation medication Past 24 months  Past 24 months   Guideline developer: UpToDate (See UpToDate for funding source) Date Released: 2014       Wellness Office Visit  Subjective:  Patient ID: [name] [name], male    DOB: 1950-09-23  Age: 70 y.o. MRN: [id number]  CC: This man comes in for follow-up of hypertension, morbid obesity, type 2 diabetes. HPI  He continues on Janumet and glipizide. He continues on Zestoretic for hypertension. Past Medical History:  Diagnosis Date   Diabetes mellitus    Hyperlipidemia    Hypertension    Renal cyst 2013   Left hemorrhagic hilar cyst   Past Surgical History:  Procedure Laterality Date   COLONOSCOPY N/A 06/28/2014   Procedure: COLONOSCOPY;  Surgeon: Rogene Houston, MD;  [facility];  Service: Endoscopy;  Laterality: N/A;  1030   NASAL SINUS SURGERY       Family History  Problem Relation Age of Onset   Diabetes Sister    Hypertension Sister    Diabetes Sister     Social History   Social History Narrative   Married for 44 years.Lives with wife.Retired.   Social History   Tobacco Use   Smoking status: Former    Pack years: 0.00   Smokeless tobacco: Never  Substance Use Topics   Alcohol use: No    Alcohol/week: 0.0 standard drinks    Current Meds  Medication Sig   Calcium Carb-Cholecalciferol (CALCIUM 500/D PO) Take 1 tablet by mouth daily.   Cholecalciferol (VITAMIN D3) 1000 units CAPS Take 1 capsule by mouth daily.   Cinnamon 500 MG capsule Take 500 mg by mouth daily as needed.   glipiZIDE (GLUCOTROL XL) 10 MG 24 hr tablet Take 1 tablet (10 mg total) by mouth daily with breakfast.   JANUMET XR [phone] MG TB24 TAKE 1 TABLET BY MOUTH EVERY DAY   lisinopril-hydrochlorothiazide (ZESTORETIC) 20-25 MG tablet Take 1 tablet by mouth daily.   Multiple Vitamin (MULTIVITAMIN)  capsule Take 1 capsule by mouth daily.   Omega-3 Fatty Acids (FISH OIL) 1000 MG CAPS Take 1 capsule (1,000 mg total) by mouth daily.   vitamin B-12 (CYANOCOBALAMIN) 1000 MCG tablet Take 1,000 mcg by mouth daily.     [place] Office Visit from 07/18/2020 in [facility]  PHQ-9 Total Score 0       Objective:   Today's Vitals: BP (!) 144/71 (BP Location: Left Arm, Patient Position: Sitting, Cuff Size: Normal)   Pulse 67   Temp 97.9 F (36.6 C)   Ht 5\' 10"  (1.778 m)   Wt 289 lb (131.1 kg)   SpO2 98%   BMI 41.47 kg/m  Vitals with BMI 10/16/2020 08/14/2020 07/18/2020  Height 5\' 10"  5\' 10"  5\' 10"   Weight 289 lbs (No Data) 289 lbs 13 oz  BMI 29.92 - 42.68  Systolic 341 962 229  Diastolic 71 71 74  Pulse 67 73 88     Physical Exam  He is morbidly obese.  Systolic blood pressure elevated today.     Assessment   1. Type 2 DM with CKD stage 2 and hypertension ([place])   2. HYPERTENSION, BENIGN   3. Morbid obesity ([place])       Tests ordered Orders Placed This Encounter  Procedures   COMPLETE METABOLIC PANEL WITH GFR   Hemoglobin A1c  Plan: 1.  Continue with oral hypoglycemic agents and we will check an A1c. 2.  Continue with Zestoretic and we will check renal function. 3.  We discussed his morbid obesity and the improvement that he would achieve in hypertension and diabetes if he lost weight.  We discussed the concept of intermittent fasting combined with a plant-based diet.  If he can fast for 18 hours every day, I think this would be beneficial.  I stressed the importance of adequate hydration.  He also will eliminate soda drinks altogether. 4.  Follow-up with Judson Roch in about 3 months.    No orders of the defined types were placed in this encounter.   Doree Albee, MD

## 2020-10-17 ENCOUNTER — Encounter (INDEPENDENT_AMBULATORY_CARE_PROVIDER_SITE_OTHER): Payer: Self-pay | Admitting: Internal Medicine

## 2020-10-17 LAB — COMPLETE METABOLIC PANEL WITH GFR
AG Ratio: 1.5 (calc) (ref 1.0–2.5)
ALT: 22 U/L (ref 9–46)
AST: 18 U/L (ref 10–35)
Albumin: 4.1 g/dL (ref 3.6–5.1)
Alkaline phosphatase (APISO): 50 U/L (ref 35–144)
BUN/Creatinine Ratio: 13 (calc) (ref 6–22)
BUN: 15 mg/dL (ref 7–25)
CO2: 28 mmol/L (ref 20–32)
Calcium: 9.5 mg/dL (ref 8.6–10.3)
Chloride: 102 mmol/L (ref 98–110)
Creat: 1.19 mg/dL — ABNORMAL HIGH (ref 0.70–1.18)
GFR, Est African American: 71 mL/min/{1.73_m2} (ref >=60)
GFR, Est Non African American: 62 mL/min/{1.73_m2} (ref >=60)
Globulin: 2.7 g/dL (calc) (ref 1.9–3.7)
Glucose, Bld: 143 mg/dL — ABNORMAL HIGH (ref 65–99)
Potassium: 3.9 mmol/L (ref 3.5–5.3)
Sodium: 140 mmol/L (ref 135–146)
Total Bilirubin: 0.7 mg/dL (ref 0.2–1.2)
Total Protein: 6.8 g/dL (ref 6.1–8.1)

## 2020-10-17 LAB — HEMOGLOBIN A1C
Hgb A1c MFr Bld: 7.3 % of total Hgb — ABNORMAL HIGH (ref <5.7)
Mean Plasma Glucose: 163 mg/dL
eAG (mmol/L): 9 mmol/L

## 2020-10-31 LAB — HM DIABETES EYE EXAM

## 2020-11-12 ENCOUNTER — Other Ambulatory Visit (INDEPENDENT_AMBULATORY_CARE_PROVIDER_SITE_OTHER): Payer: Self-pay | Admitting: Internal Medicine

## 2020-11-12 ENCOUNTER — Telehealth (INDEPENDENT_AMBULATORY_CARE_PROVIDER_SITE_OTHER): Payer: Self-pay

## 2020-11-12 DIAGNOSIS — N182 Chronic kidney disease, stage 2 (mild): Secondary | ICD-10-CM

## 2020-11-12 DIAGNOSIS — E1122 Type 2 diabetes mellitus with diabetic chronic kidney disease: Secondary | ICD-10-CM

## 2020-11-12 MED ORDER — JANUMET XR 100-1000 MG PO TB24
1.0000 | ORAL_TABLET | Freq: Every day | ORAL | 1 refills | Status: DC
Start: 2020-11-12 — End: 2021-02-13

## 2020-11-12 NOTE — Telephone Encounter (Signed)
Patient called and requested a 90 day refill sent to [facility] for the following prescription:  JANUMET XR [phone] MG TB24  Last filled 10/04/2020, # 30 sent to a different pharmacy  Last OV 10/16/2020  Next OV 01/16/21

## 2020-11-28 ENCOUNTER — Encounter (INDEPENDENT_AMBULATORY_CARE_PROVIDER_SITE_OTHER): Payer: Self-pay

## 2021-01-07 NOTE — Progress Notes (Signed)
Subjective:    Patient ID: [name] [name], male    DOB: 1950/07/06, 70 y.o.   MRN: [id number]  HPI: [name] [name] is a 70 y.o. male presenting for new patient visit to establish care.  Introduced to Designer, jewellery role and practice setting.  All questions answered.  Discussed provider/patient relationship and expectations.  Chief Complaint  Patient presents with   New Patient (Initial Visit)    NEW patient former pt of Dr Buelah Manis re establish care    HYPERTENSION / HYPERLIPIDEMIA Currently taking lisinopril-hydrochlorothiazide 20-25 for blood pressure.  Taking omega-3 fatty acid fish oil for cholesterol.  Previously intolerant to statins. Satisfied with current treatment? yes Duration of hypertension: chronic BP monitoring frequency: daily BP range: does not remember, thinks less than 130/80 BP medication side effects: no Duration of hyperlipidemia: chronic Aspirin: no Recent stressors: no Recurrent headaches: no Visual changes: no Palpitations: no Dyspnea: no Chest pain: no Lower extremity edema: no Dizzy/lightheaded: no  DIABETES Currently taking Janumet XR [phone] mg, Glucotrol XL 10 mg daily.  Last A1c in June 2022 was 7.3%. Hypoglycemic episodes: yes; ~ twice per month Polydipsia/polyuria: no Visual disturbance: no Chest pain: no Paresthesias: no Glucose Monitoring: yes  Accucheck frequency: daily  Fasting glucose: less than 160 Taking Insulin?: no Retinal Examination: Up to Date Foot Exam: Up to Date Diabetic Education: Completed Pneumovax: Not up to Date Influenza: Not up to Date Aspirin: no  CHRONIC KIDNEY DISEASE CKD status: stable Medications renally dose: yes Previous renal evaluation: no Pneumovax:  Not up to Date Influenza Vaccine:  Not up to Date  Allergies  Allergen Reactions   Aspirin     Nose Bleeds    Lipitor [Atorvastatin]    Codeine Palpitations    'heart beats real fast'    Outpatient Encounter Medications as of 01/08/2021   Medication Sig   Calcium Carb-Cholecalciferol (CALCIUM 500/D PO) Take 1 tablet by mouth daily.   Cholecalciferol (VITAMIN D3) 1000 units CAPS Take 1 capsule by mouth daily.   Cinnamon 500 MG capsule Take 500 mg by mouth daily as needed.   empagliflozin (JARDIANCE) 10 MG TABS tablet Take 1 tablet (10 mg total) by mouth daily before breakfast.   glipiZIDE (GLUCOTROL XL) 10 MG 24 hr tablet Take 1 tablet (10 mg total) by mouth daily with breakfast.   lisinopril-hydrochlorothiazide (ZESTORETIC) 20-25 MG tablet Take 1 tablet by mouth daily.   Multiple Vitamin (MULTIVITAMIN) capsule Take 1 capsule by mouth daily.   Omega-3 Fatty Acids (FISH OIL) 1000 MG CAPS Take 1 capsule (1,000 mg total) by mouth daily.   SitaGLIPtin-MetFORMIN HCl (JANUMET XR) [phone] MG TB24 Take 1 tablet by mouth daily.   vitamin B-12 (CYANOCOBALAMIN) 1000 MCG tablet Take 1,000 mcg by mouth daily.   No facility-administered encounter medications on file as of 01/08/2021.    Active Ambulatory Problems    Diagnosis Date Noted   Type 2 DM with CKD stage 2 and hypertension ([place]) 03/14/2009   Hyperlipidemia 03/14/2009   Obesity 03/14/2009   HYPERTENSION, BENIGN 03/14/2009   Hematuria 06/09/2011   Hemorrhagic cyst 06/10/2011   Boil of buttock 04/07/2013   Pain in joint, shoulder region 07/16/2014   Bell's palsy 11/13/2014   Myalgia due to HMG CoA reductase inhibitor 08/02/2017   CKD (chronic kidney disease), stage II 11/14/2018   Resolved Ambulatory Problems    Diagnosis Date Noted   No Resolved Ambulatory Problems   Past Medical History:  Diagnosis Date   Diabetes mellitus    Hypertension  Renal cyst 2013    Past Medical History:  Diagnosis Date   Diabetes mellitus    Hyperlipidemia    Hypertension    Renal cyst 2013   Left hemorrhagic hilar cyst    Past Surgical History:  Procedure Laterality Date   COLONOSCOPY N/A 06/28/2014   Procedure: COLONOSCOPY;  Surgeon: Rogene Houston, MD;  [facility];  Service: Endoscopy;  Laterality: N/A;  1030   NASAL SINUS SURGERY      Social History   Tobacco Use   Smoking status: Former   Smokeless tobacco: Never  Scientific laboratory technician Use: Never used  Substance Use Topics   Alcohol use: No    Alcohol/week: 0.0 standard drinks   Drug use: No    Family History  Problem Relation Age of Onset   Diabetes Sister    Hypertension Sister    Diabetes Sister     Review of Systems Per HPI unless specifically indicated above     Objective:    BP 130/84 (BP Location: Left Arm, Patient Position: Sitting, Cuff Size: Normal)   Pulse 83   Ht '5\' 10"'$  (1.778 m)   Wt 285 lb (129.3 kg)   SpO2 99%   BMI 40.89 kg/m   Wt Readings from Last 3 Encounters:  01/08/21 285 lb (129.3 kg)  10/16/20 289 lb (131.1 kg)  07/18/20 289 lb 12.8 oz (131.5 kg)    Physical Exam Vitals and nursing note reviewed.  Constitutional:      General: He is not in acute distress.    Appearance: Normal appearance. He is obese. He is not toxic-appearing.  Eyes:     General: No scleral icterus.    Extraocular Movements: Extraocular movements intact.  Neck:     Vascular: No carotid bruit.  Cardiovascular:     Rate and Rhythm: Normal rate.     Heart sounds: Normal heart sounds. No murmur heard. Pulmonary:     Effort: Pulmonary effort is normal. No respiratory distress.     Breath sounds: Normal breath sounds. No wheezing or rhonchi.  Abdominal:     General: Abdomen is flat. Bowel sounds are normal. There is no distension.  Musculoskeletal:        General: Normal range of motion.     Cervical back: Normal range of motion.     Right lower leg: No edema.     Left lower leg: No edema.  Skin:    General: Skin is warm and dry.     Coloration: Skin is not jaundiced or pale.  Neurological:     General: No focal deficit present.     Mental Status: He is alert and oriented to person, place, and time.     Motor: No weakness.     Gait: Gait normal.  Psychiatric:         Mood and Affect: Mood normal.        Behavior: Behavior normal.        Thought Content: Thought content normal.        Judgment: Judgment normal.       Assessment & Plan:   Problem List Items Addressed This Visit       Cardiovascular and Mediastinum   Type 2 DM with CKD stage 2 and hypertension ([place]) - Primary    Chronic.  Will check A1c today. If elevated above goal of less than 7%, will plan to start Jardiance.  Samples given to patient today.  Eye exam is  up to date.  Foot exam is up to date.  Previously intolerant to statins, discussed starting Zetia if ASCVD remains elevated.  Continue Janumet and glipizide daily for now.  Follow up in 3 months.      Relevant Medications   empagliflozin (JARDIANCE) 10 MG TABS tablet   Other Relevant Orders   Hemoglobin A1c (Completed)   BASIC METABOLIC PANEL WITH GFR (Completed)   HYPERTENSION, BENIGN    Chronic.  BP is at goal today in office.  Continue lisinopril-HCTZ 20-25.  Check electrolytes with kidney function today.  Follow up in 3 months.         Genitourinary   CKD (chronic kidney disease), stage II    Chronic.  Continue avoiding nephrotoxic agents.  Discussed we need to minimize progression by controlled BP and diabetes.  Check kidney function today.      Relevant Orders   BASIC METABOLIC PANEL WITH GFR (Completed)     Other   Myalgia due to HMG CoA reductase inhibitor   Hyperlipidemia    Chronic.  Previously intolerant to statins.  Tolerating Omega 3 fish oil well.  Check lipids today - patient is fasting.  We discussed starting Zetia if LDL remains above goal of less than 70.      Relevant Orders   Lipid Panel (Completed)     Follow up plan: Return for 3 months HTN/HLD/DM.

## 2021-01-08 ENCOUNTER — Other Ambulatory Visit: Payer: Self-pay

## 2021-01-08 ENCOUNTER — Ambulatory Visit (INDEPENDENT_AMBULATORY_CARE_PROVIDER_SITE_OTHER): Payer: Medicare Other | Admitting: Nurse Practitioner

## 2021-01-08 ENCOUNTER — Encounter: Payer: Self-pay | Admitting: Nurse Practitioner

## 2021-01-08 VITALS — BP 130/84 | HR 83 | Ht 70.0 in | Wt 285.0 lb

## 2021-01-08 DIAGNOSIS — I10 Essential (primary) hypertension: Secondary | ICD-10-CM

## 2021-01-08 DIAGNOSIS — T466X5A Adverse effect of antihyperlipidemic and antiarteriosclerotic drugs, initial encounter: Secondary | ICD-10-CM

## 2021-01-08 DIAGNOSIS — N182 Chronic kidney disease, stage 2 (mild): Secondary | ICD-10-CM | POA: Diagnosis not present

## 2021-01-08 DIAGNOSIS — I129 Hypertensive chronic kidney disease with stage 1 through stage 4 chronic kidney disease, or unspecified chronic kidney disease: Secondary | ICD-10-CM

## 2021-01-08 DIAGNOSIS — M791 Myalgia, unspecified site: Secondary | ICD-10-CM | POA: Diagnosis not present

## 2021-01-08 DIAGNOSIS — E1122 Type 2 diabetes mellitus with diabetic chronic kidney disease: Secondary | ICD-10-CM

## 2021-01-08 DIAGNOSIS — E7800 Pure hypercholesterolemia, unspecified: Secondary | ICD-10-CM

## 2021-01-09 LAB — LIPID PANEL
Cholesterol: 217 mg/dL — ABNORMAL HIGH (ref <200)
HDL: 45 mg/dL (ref >=40)
LDL Cholesterol (Calc): 141 mg/dL (calc) — ABNORMAL HIGH
Non-HDL Cholesterol (Calc): 172 mg/dL (calc) — ABNORMAL HIGH (ref <130)
Total CHOL/HDL Ratio: 4.8 (calc) (ref <5.0)
Triglycerides: 170 mg/dL — ABNORMAL HIGH (ref <150)

## 2021-01-09 LAB — BASIC METABOLIC PANEL WITH GFR
BUN: 15 mg/dL (ref 7–25)
CO2: 29 mmol/L (ref 20–32)
Calcium: 9.7 mg/dL (ref 8.6–10.3)
Chloride: 101 mmol/L (ref 98–110)
Creat: 1.27 mg/dL (ref 0.70–1.28)
Glucose, Bld: 197 mg/dL — ABNORMAL HIGH (ref 65–99)
Potassium: 4.2 mmol/L (ref 3.5–5.3)
Sodium: 139 mmol/L (ref 135–146)
eGFR: 60 mL/min/{1.73_m2} (ref >=60)

## 2021-01-09 LAB — HEMOGLOBIN A1C
Hgb A1c MFr Bld: 7.4 % of total Hgb — ABNORMAL HIGH (ref <5.7)
Mean Plasma Glucose: 166 mg/dL
eAG (mmol/L): 9.2 mmol/L

## 2021-01-09 MED ORDER — EMPAGLIFLOZIN 10 MG PO TABS
10.0000 mg | ORAL_TABLET | Freq: Every day | ORAL | 1 refills | Status: DC
Start: 2021-01-09 — End: 2021-04-03

## 2021-01-09 NOTE — Assessment & Plan Note (Signed)
Chronic.  Will check A1c today. If elevated above goal of less than 7%, will plan to start Jardiance.  Samples given to patient today.  Eye exam is up to date.  Foot exam is up to date.  Previously intolerant to statins, discussed starting Zetia if ASCVD remains elevated.  Continue Janumet and glipizide daily for now.  Follow up in 3 months.

## 2021-01-09 NOTE — Assessment & Plan Note (Signed)
Chronic.  Previously intolerant to statins.  Tolerating Omega 3 fish oil well.  Check lipids today - patient is fasting.  We discussed starting Zetia if LDL remains above goal of less than 70.

## 2021-01-09 NOTE — Assessment & Plan Note (Signed)
Chronic.  Continue avoiding nephrotoxic agents.  Discussed we need to minimize progression by controlled BP and diabetes.  Check kidney function today.

## 2021-01-09 NOTE — Assessment & Plan Note (Signed)
Chronic.  BP is at goal today in office.  Continue lisinopril-HCTZ 20-25.  Check electrolytes with kidney function today.  Follow up in 3 months.

## 2021-01-16 ENCOUNTER — Ambulatory Visit (INDEPENDENT_AMBULATORY_CARE_PROVIDER_SITE_OTHER): Payer: Medicare Other | Admitting: Nurse Practitioner

## 2021-02-13 ENCOUNTER — Other Ambulatory Visit: Payer: Self-pay | Admitting: *Deleted

## 2021-02-13 DIAGNOSIS — N182 Chronic kidney disease, stage 2 (mild): Secondary | ICD-10-CM

## 2021-02-13 DIAGNOSIS — E1122 Type 2 diabetes mellitus with diabetic chronic kidney disease: Secondary | ICD-10-CM

## 2021-02-13 MED ORDER — JANUMET XR 100-1000 MG PO TB24: 1.0000 | ORAL_TABLET | Freq: Every day | ORAL | 1 refills | Status: DC

## 2021-04-03 ENCOUNTER — Other Ambulatory Visit: Payer: Self-pay | Admitting: Nurse Practitioner

## 2021-04-03 DIAGNOSIS — E1122 Type 2 diabetes mellitus with diabetic chronic kidney disease: Secondary | ICD-10-CM

## 2021-04-03 DIAGNOSIS — N182 Chronic kidney disease, stage 2 (mild): Secondary | ICD-10-CM

## 2021-04-09 ENCOUNTER — Ambulatory Visit (INDEPENDENT_AMBULATORY_CARE_PROVIDER_SITE_OTHER): Payer: Medicare Other | Admitting: Nurse Practitioner

## 2021-04-09 ENCOUNTER — Other Ambulatory Visit: Payer: Self-pay

## 2021-04-09 ENCOUNTER — Encounter: Payer: Self-pay | Admitting: Nurse Practitioner

## 2021-04-09 VITALS — BP 128/78 | HR 85 | Ht 70.0 in | Wt 279.0 lb

## 2021-04-09 DIAGNOSIS — E1122 Type 2 diabetes mellitus with diabetic chronic kidney disease: Secondary | ICD-10-CM

## 2021-04-09 DIAGNOSIS — I129 Hypertensive chronic kidney disease with stage 1 through stage 4 chronic kidney disease, or unspecified chronic kidney disease: Secondary | ICD-10-CM | POA: Diagnosis not present

## 2021-04-09 DIAGNOSIS — N182 Chronic kidney disease, stage 2 (mild): Secondary | ICD-10-CM | POA: Diagnosis not present

## 2021-04-09 DIAGNOSIS — I10 Essential (primary) hypertension: Secondary | ICD-10-CM

## 2021-04-09 NOTE — Progress Notes (Signed)
Subjective:    Patient ID: [name] [name], male    DOB: Oct 20, 1950, 70 y.o.   MRN: [id number]  HPI: [name] [name] is a 70 y.o. male presenting for follow up.  Chief Complaint  Patient presents with   Diabetes   DIABETES Patient reports he had a side effect with Jardiance-heart rate increased to 90 and blood pressure dropped.  He also had insomnia.  He is tolerating Janumet XR [phone] and glipizide XL 10 mg daily well.  He is feeling frustrated that his fasting blood sugars are higher than his nighttime blood sugars.  He denies eating snacks before bedtime.  He is not interested in adding any medication at this time. Hypoglycemic episodes:no Polydipsia/polyuria: no Visual disturbance: no Chest pain: no Paresthesias: no Glucose Monitoring: yes  Accucheck frequency: multiple times daily  Fasting glucose: most less than 150, one 167   Evening: 120s Taking Insulin?: no Blood Pressure Monitoring: daily Home BP: 120s/70s Retinal Examination: Up to Date Foot Exam: Up to Date Diabetic Education: Completed Pneumovax:  not up to date, declines Influenza: not up to date, declines Aspirin: no Statin: no; severe myalgias in the past and declines Zetia Physical activity: he is going to work on it Dietary habits: avoids red meat, pork A1c goal: less than 7.5%  HYPERTENSION / HYPERLIPIDEMIA Currently taking lisinopril-hydrochlorothiazide 20-25.  Brings log with him of home blood pressure readings today and they are consistently less than 130/80.  He is currently taking omega-3 fish oil 1 capsule daily.  He has not done well with statins in the past.  He declined Zetia today. BP monitoring frequency: daily Aspirin: no Recent stressors: no Recurrent headaches: no Visual changes: no Palpitations: no Dyspnea: no Chest pain: no Lower extremity edema: no Dizzy/lightheaded: no Myalgias: no LDL goal: less than 70 BP goal: less than 140/90 The 10-year ASCVD risk score (Arnett DK, et al.,  2019) is: 35.6%   Values used to calculate the score:     Age: 55 years     Sex: Male     Is Non-Hispanic African American: Yes     Diabetic: Yes     Tobacco smoker: No     Systolic Blood Pressure: 081 mmHg     Is BP treated: Yes     HDL Cholesterol: 45 mg/dL     Total Cholesterol: 217 mg/dL   Allergies  Allergen Reactions   Aspirin     Nose Bleeds    Lipitor [Atorvastatin]    Codeine Palpitations    'heart beats real fast'    Outpatient Encounter Medications as of 04/09/2021  Medication Sig   Calcium Carb-Cholecalciferol (CALCIUM 500/D PO) Take 1 tablet by mouth daily.   Cholecalciferol (VITAMIN D3) 1000 units CAPS Take 1 capsule by mouth daily.   Cinnamon 500 MG capsule Take 500 mg by mouth daily as needed.   glipiZIDE (GLUCOTROL XL) 10 MG 24 hr tablet Take 1 tablet (10 mg total) by mouth daily with breakfast.   lisinopril-hydrochlorothiazide (ZESTORETIC) 20-25 MG tablet Take 1 tablet by mouth daily.   Multiple Vitamin (MULTIVITAMIN) capsule Take 1 capsule by mouth daily.   Omega-3 Fatty Acids (FISH OIL) 1000 MG CAPS Take 1 capsule (1,000 mg total) by mouth daily.   vitamin B-12 (CYANOCOBALAMIN) 1000 MCG tablet Take 1,000 mcg by mouth daily.   [DISCONTINUED] JARDIANCE 10 MG TABS tablet TAKE 1 TABLET BY MOUTH  DAILY BEFORE BREAKFAST   SitaGLIPtin-MetFORMIN HCl (JANUMET XR) [phone] MG TB24 Take 1 tablet by mouth daily.  No facility-administered encounter medications on file as of 04/09/2021.    Patient Active Problem List   Diagnosis Date Noted   CKD (chronic kidney disease), stage II 11/14/2018   Myalgia due to HMG CoA reductase inhibitor 08/02/2017   Bell's palsy 11/13/2014   Pain in joint, shoulder region 07/16/2014   Boil of buttock 04/07/2013   Hemorrhagic cyst 06/10/2011   Hematuria 06/09/2011   Type 2 DM with CKD stage 2 and hypertension ([place]) 03/14/2009   Hyperlipidemia 03/14/2009   Obesity 03/14/2009   HYPERTENSION, BENIGN 03/14/2009    Past Medical  History:  Diagnosis Date   Diabetes mellitus    Hyperlipidemia    Hypertension    Renal cyst 2013   Left hemorrhagic hilar cyst    Relevant past medical, surgical, family and social history reviewed and updated as indicated. Interim medical history since our last visit reviewed.  Review of Systems Per HPI unless specifically indicated above     Objective:    BP 128/78 (BP Location: Left Arm, Cuff Size: Large)    Pulse 85    Ht 5\' 10"  (1.778 m)    Wt 279 lb (126.6 kg)    SpO2 96%    BMI 40.03 kg/m   Wt Readings from Last 3 Encounters:  04/09/21 279 lb (126.6 kg)  01/08/21 285 lb (129.3 kg)  10/16/20 289 lb (131.1 kg)    Physical Exam Vitals and nursing note reviewed.  Constitutional:      General: He is not in acute distress.    Appearance: Normal appearance. He is obese. He is not toxic-appearing.  Eyes:     General: No scleral icterus.    Extraocular Movements: Extraocular movements intact.  Neck:     Vascular: No carotid bruit.  Cardiovascular:     Rate and Rhythm: Normal rate and regular rhythm.     Heart sounds: Normal heart sounds. No murmur heard. Pulmonary:     Effort: Pulmonary effort is normal. No respiratory distress.     Breath sounds: Normal breath sounds. No wheezing or rhonchi.  Abdominal:     General: Abdomen is flat. Bowel sounds are normal. There is no distension.  Musculoskeletal:        General: Normal range of motion.     Cervical back: Normal range of motion and neck supple.     Right lower leg: No edema.     Left lower leg: No edema.  Lymphadenopathy:     Cervical: No cervical adenopathy.  Skin:    General: Skin is warm and dry.     Capillary Refill: Capillary refill takes less than 2 seconds.     Coloration: Skin is not jaundiced or pale.  Neurological:     General: No focal deficit present.     Mental Status: He is alert and oriented to person, place, and time.     Motor: No weakness.     Gait: Gait normal.  Psychiatric:        Mood  and Affect: Mood normal.        Behavior: Behavior normal.        Thought Content: Thought content normal.        Judgment: Judgment normal.      Assessment & Plan:   Problem List Items Addressed This Visit       Cardiovascular and Mediastinum   Type 2 DM with CKD stage 2 and hypertension ([place]) - Primary    Chronic.  Check hemoglobin A1c today-goal is less  than 7.5% given age and comorbidities, risk of hypoglycemic episode leading to fall.  Plan to continue glipizide XL 10 mg daily, Janumet XR [phone] mg daily.  Patient tried Jardiance and had side effect- he is very weary to start any new medications for diabetes.  He also had severe myalgias with statins and is not willing to try Zetia at this time.  Instead, he would like to continue working on dietary and lifestyle changes.  We will hold off on checking lipid panel-we will plan to recheck at next visit.  Foot exam and eye exam are up-to-date.  Patient declines all vaccinations including influenza, COVID booster, pneumonia vaccine despite my recommendation.  Follow-up 3 months.      Relevant Orders   Hemoglobin A1c   AMB Referral to [facility]   HYPERTENSION, BENIGN    Chronic.  Upon recheck, blood pressure is improved today in clinic.  Plan to continue lisinopril-hydrochlorothiazide at prescribed dose for now.  Recent kidney function and electrolytes stable.  Recheck 3 months.        Follow up plan: Return in about 3 months (around 07/08/2021) for DM, HLD f/u.

## 2021-04-09 NOTE — Assessment & Plan Note (Addendum)
Chronic.  Check hemoglobin A1c today-goal is less than 7.5% given age and comorbidities, risk of hypoglycemic episode leading to fall.  Plan to continue glipizide XL 10 mg daily, Janumet XR [phone] mg daily.  Patient tried Jardiance and had side effect- he is very weary to start any new medications for diabetes.  He also had severe myalgias with statins and is not willing to try Zetia at this time.  Instead, he would like to continue working on dietary and lifestyle changes.  We will hold off on checking lipid panel-we will plan to recheck at next visit.  Foot exam and eye exam are up-to-date.  Patient declines all vaccinations including influenza, COVID booster, pneumonia vaccine despite my recommendation.  Follow-up 3 months.

## 2021-04-09 NOTE — Assessment & Plan Note (Signed)
Chronic.  Upon recheck, blood pressure is improved today in clinic.  Plan to continue lisinopril-hydrochlorothiazide at prescribed dose for now.  Recent kidney function and electrolytes stable.  Recheck 3 months.

## 2021-04-10 LAB — HEMOGLOBIN A1C
Hgb A1c MFr Bld: 7.3 % of total Hgb — ABNORMAL HIGH (ref <5.7)
Mean Plasma Glucose: 163 mg/dL
eAG (mmol/L): 9 mmol/L

## 2021-04-11 ENCOUNTER — Telehealth: Payer: Self-pay | Admitting: Nurse Practitioner

## 2021-04-11 NOTE — Chronic Care Management (AMB) (Signed)
°  Chronic Care Management   Note  04/11/2021 Name: [name] [name] MRN: [id number] DOB: Aug 31, 1950  [name] [name] is a 70 y.o. year old male who is a primary care patient of Eulogio Bear, NP. I reached out to Tana Conch by phone today in response to a referral sent by [name] [name]'s PCP, Eulogio Bear, NP.   [name] [name] was given information about Chronic Care Management services today including:  CCM service includes personalized support from designated clinical staff supervised by his physician, including individualized plan of care and coordination with other care providers 24/7 contact phone numbers for assistance for urgent and routine care needs. Service will only be billed when office clinical staff spend 20 minutes or more in a month to coordinate care. Only one practitioner may furnish and bill the service in a calendar month. The patient may stop CCM services at any time (effective at the end of the month) by phone call to the office staff.   Patient agreed to services and verbal consent obtained.   Follow up plan:   Tatjana Secretary/administrator

## 2021-05-19 ENCOUNTER — Emergency Department (HOSPITAL_COMMUNITY)
Admission: EM | Admit: 2021-05-19 | Discharge: 2021-05-19 | Disposition: A | Discharge status: Home / Self Care | Payer: Medicare Other | Attending: Emergency Medicine | Admitting: Emergency Medicine

## 2021-05-19 ENCOUNTER — Encounter (HOSPITAL_COMMUNITY): Payer: Self-pay | Admitting: Emergency Medicine

## 2021-05-19 DIAGNOSIS — H5789 Other specified disorders of eye and adnexa: Secondary | ICD-10-CM | POA: Diagnosis present

## 2021-05-19 DIAGNOSIS — H43391 Other vitreous opacities, right eye: Secondary | ICD-10-CM | POA: Diagnosis not present

## 2021-05-19 DIAGNOSIS — H2513 Age-related nuclear cataract, bilateral: Secondary | ICD-10-CM | POA: Diagnosis not present

## 2021-05-19 DIAGNOSIS — I10 Essential (primary) hypertension: Secondary | ICD-10-CM | POA: Diagnosis not present

## 2021-05-19 DIAGNOSIS — H18413 Arcus senilis, bilateral: Secondary | ICD-10-CM | POA: Diagnosis not present

## 2021-05-19 DIAGNOSIS — E113393 Type 2 diabetes mellitus with moderate nonproliferative diabetic retinopathy without macular edema, bilateral: Secondary | ICD-10-CM | POA: Diagnosis not present

## 2021-05-19 DIAGNOSIS — H43811 Vitreous degeneration, right eye: Secondary | ICD-10-CM | POA: Diagnosis not present

## 2021-05-19 MED ORDER — FLUORESCEIN SODIUM 1 MG OP STRP
1.0000 | ORAL_STRIP | Freq: Once | OPHTHALMIC | Status: AC
  Administered 2021-05-19: 1 via OPHTHALMIC
  Filled 2021-05-19: qty 1

## 2021-05-19 MED ORDER — TETRACAINE HCL 0.5 % OP SOLN
2.0000 [drp] | Freq: Once | OPHTHALMIC | Status: AC
  Administered 2021-05-19: 2 [drp] via OPHTHALMIC
  Filled 2021-05-19: qty 4

## 2021-05-19 NOTE — ED Notes (Signed)
ED Provider at bedside. 

## 2021-05-19 NOTE — ED Provider Notes (Signed)
[facility] EMERGENCY DEPARTMENT Provider Note   CSN: [id number] Arrival date & time: 05/19/21  0234     History  Chief Complaint  Patient presents with   Eye Problem    [name] [name] is a 71 y.o. male.  The history is provided by the patient.  Eye Problem Location:  Right eye Severity:  Mild Onset quality:  Sudden Duration:  3 hours Timing:  Constant Progression:  Resolved Chronicity:  New Context: not burn, not chemical exposure, not contact lens problem, not direct trauma, not foreign body and not scratch   Relieved by:  None tried Worsened by:  Nothing Associated symptoms: no blurred vision, no crusting, no decreased vision, no discharge, no double vision, no headaches, no inflammation, no itching, no nausea, no numbness, no photophobia, no redness, no tingling and no weakness   Risk factors: no previous injury to eye   Patient reports he woke up around midnight coughing.  He then noted he had black dots floating in his vision in his right eye.  It is now resolved.  No trauma.  He does not wear corrective lenses.  No previous eye surgery.  No blurred vision, no visual loss.  No diplopia. No arm or leg weakness.  No slurred speech.  He has had recent cough  no other acute complaints    No recent trauma Home Medications Prior to Admission medications   Medication Sig Start Date End Date Taking? Authorizing Provider  Calcium Carb-Cholecalciferol (CALCIUM 500/D PO) Take 1 tablet by mouth daily.    [provider]  Cholecalciferol (VITAMIN D3) 1000 units CAPS Take 1 capsule by mouth daily.    [provider]  Cinnamon 500 MG capsule Take 500 mg by mouth daily as needed.    [provider]  glipiZIDE (GLUCOTROL XL) 10 MG 24 hr tablet Take 1 tablet (10 mg total) by mouth daily with breakfast. 06/26/20   Alycia Rossetti, MD  lisinopril-hydrochlorothiazide (ZESTORETIC) 20-25 MG tablet Take 1 tablet by mouth daily. 06/26/20   Alycia Rossetti, MD   Multiple Vitamin (MULTIVITAMIN) capsule Take 1 capsule by mouth daily.    [provider]  Omega-3 Fatty Acids (FISH OIL) 1000 MG CAPS Take 1 capsule (1,000 mg total) by mouth daily. 05/22/19   Lodge, Modena Nunnery, MD  SitaGLIPtin-MetFORMIN HCl (JANUMET XR) [phone] MG TB24 Take 1 tablet by mouth daily. 02/13/21 03/15/21  Eulogio Bear, NP  vitamin B-12 (CYANOCOBALAMIN) 1000 MCG tablet Take 1,000 mcg by mouth daily.    [provider]      Allergies    Aspirin, Lipitor [atorvastatin], and Codeine    Review of Systems   Review of Systems  Constitutional:  Negative for fever.  Eyes:  Positive for visual disturbance. Negative for blurred vision, double vision, photophobia, pain, discharge, redness and itching.       Denies diplopia Reports black spots in his vision that have resolved There is no blurred vision  Respiratory:  Positive for cough.   Gastrointestinal:  Negative for nausea.  Neurological:  Negative for dizziness, tingling, speech difficulty, weakness, light-headedness, numbness and headaches.   Physical Exam Updated Vital Signs BP (!) 148/85 (BP Location: Left Arm)    Pulse 89    Temp 98 F (36.7 C) (Oral)    Resp 20    Ht 1.829 m (6')    Wt 126.6 kg    SpO2 99%    BMI 37.85 kg/m  Physical Exam CONSTITUTIONAL: Well developed/well nourished HEAD: Normocephalic/atraumatic  EYES: EOMI/PERRL, no nystagmus, no visual field deficit  no ptosis, IOP approximately 20 OD, no corneal haziness, no corneal lesions, no corneal abrasions, no foreign bodies no proptosis, no conjunctival injection, no eye discharge Visual acuity 20/30 in each eye ENMT: Mucous membranes moist NECK: supple no meningeal signs, no bruits CV: S1/S2 noted, no murmurs/rubs/gallops noted LUNGS: Lungs are clear to auscultation bilaterally, no apparent distress ABDOMEN: soft, obese NEURO:Awake/alert, face symmetric, no arm or leg drift is noted Equal 5/5 strength with shoulder abduction,  elbow flex/extension, wrist flex/extension in upper extremities and equal hand grips bilaterally Equal 5/5 strength with hip flexion,knee flex/extension, foot dorsi/plantar flexion Cranial nerves 3/4/5/6/11/02/08/11/12 tested and intact Gait normal without ataxia Sensation to light touch intact in all extremities EXTREMITIES: pulses normal, full ROM SKIN: warm, color normal PSYCH: no abnormalities of mood noted  ED Results / Procedures / Treatments   Labs (all labs ordered are listed, but only abnormal results are displayed) Labs Reviewed - No data to display  EKG EKG Interpretation  Date/Time:  Monday May 19 2021 02:42:03 EST Ventricular Rate:  96 PR Interval:  168 QRS Duration: 86 QT Interval:  351 QTC Calculation: 444 R Axis:   -28 Text Interpretation: Sinus rhythm Borderline left axis deviation Baseline wander in lead(s) V1 Interpretation limited secondary to artifact No significant change since last tracing Confirmed by Ripley Fraise [phone]) on 05/19/2021 2:46:17 AM  Radiology No results found.  Procedures Procedures    Medications Ordered in ED Medications  tetracaine (PONTOCAINE) 0.5 % ophthalmic solution 2 drop (2 drops Right Eye Given 05/19/21 0257)  fluorescein ophthalmic strip 1 strip (1 strip Right Eye Given 05/19/21 0257)    ED Course/ Medical Decision Making/ A&P                           Medical Decision Making Amount and/or Complexity of Data Reviewed ECG/medicine tests: ordered and independent interpretation performed.   Patient notes with black dots in his vision that have resolved.  He denies any other acute complaints He is in no distress, his visual acuity is appropriate.  I have personally reviewed the EKG and there is no acute findings.  No signs of atrial fibrillation His exam is essentially unremarkable.  Its possible he is having early signs of a retinal detachment. Patient does not have any other neurologic symptoms to suggest TIA/CVA.  I  considered neuro-imaging, but risk of acute neurologic emergency is very low 3:54 AM Patient is back to baseline, he denies any visual changes at this time He will call ophthalmology later this morning for urgent follow-up We discussed strict ER return precautions.  We discussed signs and symptoms of CVA and when to call 911       Final Clinical Impression(s) / ED Diagnoses Final diagnoses:  Vitreous floaters of right eye    Rx / DC Orders ED Discharge Orders     None         Ripley Fraise, MD 05/19/21 [phone]

## 2021-05-19 NOTE — ED Triage Notes (Signed)
Pt states he woke up coughing this morning and noticed that he is seeing little black dots in the right eye.

## 2021-05-20 ENCOUNTER — Telehealth: Payer: Self-pay | Admitting: Nurse Practitioner

## 2021-05-20 ENCOUNTER — Other Ambulatory Visit: Payer: Self-pay

## 2021-05-20 MED ORDER — LISINOPRIL-HYDROCHLOROTHIAZIDE 20-25 MG PO TABS: 1.0000 | ORAL_TABLET | Freq: Every day | ORAL | 3 refills | Status: DC

## 2021-05-20 MED ORDER — GLIPIZIDE ER 10 MG PO TB24: 10.0000 mg | ORAL_TABLET | Freq: Every day | ORAL | 3 refills | Status: DC

## 2021-05-20 NOTE — Telephone Encounter (Signed)
Patient requesting refills (before provider's last day) of   glipiZIDE (GLUCOTROL XL) 10 MG 24 hr tablet [[id number]]   lisinopril-hydrochlorothiazide (ZESTORETIC) 20-25 MG tablet [[id number]]   Pharmacy confirmed as  [facility] ([place], [place] [facility]  [address] [place] [address], [place] [address]  Phone:  [phone]  Fax:  [phone]   Please advise at [phone].

## 2021-05-20 NOTE — Telephone Encounter (Signed)
Rx's sent as requested. 

## 2021-05-26 ENCOUNTER — Telehealth: Payer: Self-pay | Admitting: Pharmacist

## 2021-05-26 NOTE — Progress Notes (Signed)
Chronic Care Management Pharmacy Assistant   Name: [name] [name]  MRN: [id number] DOB: Sep 04, 1950   [name] [name] is an 71 y.o. year old male who presents for his initial CCM visit with the clinical pharmacist on 05/29/21 @ 11:00am   Reason for Encounter: Chart prep for initial visit with CPP    Recent office visits:  04/09/21 [name], NP - Family Medicine - Diabetes with CKD - Labs were ordered. Referral to [facility] Care Coordination placed. Plan to continue lisinopril-hydrochlorothiazide at prescribed dose for now.  Recent kidney function and electrolytes stable. Recheck 3 months. Empagliflozin (JARDIANCE) 10 MG TABS tablet was prescribed.   01/08/21 [name], NP - Family Medicine - Diabetes with CKD Labs were ordered. empagliflozin (JARDIANCE) 10 MG TABS tablet Continue Janumet and glipizide daily for now.  Follow up in 3 months.  Recent consult visits:  None noted.  Hospital visits: 05/19/21 Medication Reconciliation was completed by comparing discharge summary, patients EMR and Pharmacy list, and upon discussion with patient.  Admitted to the hospital on 05/19/21 due to Vitreous floaters of right eye. Discharge date was 05/19/21. Discharged from [place]?Medications Started at [facility] Discharge:?? None noted  Medication Changes at Hospital Discharge: None noted  Medications Discontinued at Hospital Discharge: None noted.  Medications that remain the same after Hospital Discharge:??  All other medications will remain the same.    Medications: Outpatient Encounter Medications as of 05/26/2021  Medication Sig   Calcium Carb-Cholecalciferol (CALCIUM 500/D PO) Take 1 tablet by mouth daily.   Cholecalciferol (VITAMIN D3) 1000 units CAPS Take 1 capsule by mouth daily.   Cinnamon 500 MG capsule Take 500 mg by mouth daily as needed.   glipiZIDE (GLUCOTROL XL) 10 MG 24 hr tablet Take 1 tablet (10 mg total) by mouth daily with breakfast.    lisinopril-hydrochlorothiazide (ZESTORETIC) 20-25 MG tablet Take 1 tablet by mouth daily.   Multiple Vitamin (MULTIVITAMIN) capsule Take 1 capsule by mouth daily.   Omega-3 Fatty Acids (FISH OIL) 1000 MG CAPS Take 1 capsule (1,000 mg total) by mouth daily.   SitaGLIPtin-MetFORMIN HCl (JANUMET XR) [phone] MG TB24 Take 1 tablet by mouth daily.   vitamin B-12 (CYANOCOBALAMIN) 1000 MCG tablet Take 1,000 mcg by mouth daily.   No facility-administered encounter medications on file as of 05/26/2021.    Have you seen any other providers since your last visit? no  Any changes in your medications or health? no  Any side effects from any medications? no  Do you have an symptoms or problems not managed by your medications? No  Any concerns about your health right now? no  Has your provider asked that you check blood pressure, blood sugar, or follow special diet at home? Yes Patient checks blood pressure and blood sugar daily.   Do you get any type of exercise on a regular basis? yes  Can you think of a goal you would like to reach for your health?  Patient stated he would just like to continue to get better.   Do you have any problems getting your medications? no  Is there anything that you would like to discuss during the appointment?  Patient did not have anything else he could think of at this time of the call.   Please bring medications and supplements to appointment  Patient confirmed appointment date and time.   Care Gaps  AWV: never Colonoscopy: done 06/06/14 DM Eye Exam:  due 10/31/21 DM Foot Exam: due  06/26/21 Microalbumin: done 06/26/20 HbgAIC: done 04/09/21 (7.3) DEXA:  N/A Mammogram:N/A  Star Rating Drugs: glipiZIDE (GLUCOTROL XL) 10 MG 24 hr tablet - last filled 04/17/21 90 days  lisinopril-hydrochlorothiazide (ZESTORETIC) 20-25 MG tablet - last filled 03/28/21 90 days  SitaGLIPtin-MetFORMIN HCl (JANUMET XR) [phone] MG TB24 - last filled 04/29/21  Future Appointments  Date  Time Provider [place]  05/29/2021 11:00 AM [facility] PHARMACIST [facility] None  07/04/2021  2:00 PM [place], Dewaine Conger, FNP [facility] [place], [place] Pharmacist Assistant  [phone]

## 2021-05-28 NOTE — Progress Notes (Signed)
Chronic Care Management Pharmacy Note  05/29/2021 Name:  [name] [name] MRN:  [id number] DOB:  1950-05-31  Summary: Initial visit with PharmD.  Assisting with copay on [place].  Previously failed jardiance due to adverse effects.  Elevated LDL with ASCVD risk 35.6% (high).  Declines treatment and prefers to work on diet and exercise.  Recommendations/Changes made from today's visit: Follow lipids - Repatha?? Patient assistance with Janumet  Plan: FU 4 months   Subjective: [name] [name] is an 71 y.o. year old male who is a primary patient of Eulogio Bear, NP.  The CCM team was consulted for assistance with disease management and care coordination needs.    Engaged with patient face to face for initial visit in response to provider referral for pharmacy case management and/or care coordination services.   Consent to Services:  The patient was given the following information about Chronic Care Management services today, agreed to services, and gave verbal consent: 1. CCM service includes personalized support from designated clinical staff supervised by the primary care provider, including individualized plan of care and coordination with other care providers 2. 24/7 contact phone numbers for assistance for urgent and routine care needs. 3. Service will only be billed when office clinical staff spend 20 minutes or more in a month to coordinate care. 4. Only one practitioner may furnish and bill the service in a calendar month. 5.The patient may stop CCM services at any time (effective at the end of the month) by phone call to the office staff. 6. The patient will be responsible for cost sharing (co-pay) of up to 20% of the service fee (after annual deductible is met). Patient agreed to services and consent obtained.  Patient Care Team: Eulogio Bear, NP as PCP - General (Nurse Practitioner) Edythe Clarity, [facility] as Pharmacist (Pharmacist)  Recent office visits:  04/09/21 [name], NP - Family Medicine - Diabetes with CKD - Labs were ordered. Referral to [facility] Care Coordination placed. Plan to continue lisinopril-hydrochlorothiazide at prescribed dose for now.  Recent kidney function and electrolytes stable. Recheck 3 months. Empagliflozin (JARDIANCE) 10 MG TABS tablet was prescribed.    01/08/21 [name], NP - Family Medicine - Diabetes with CKD Labs were ordered. empagliflozin (JARDIANCE) 10 MG TABS tablet Continue Janumet and glipizide daily for now.  Follow up in 3 months.   Recent consult visits:  None noted.   Hospital visits: 05/19/21 Medication Reconciliation was completed by comparing discharge summary, patients EMR and Pharmacy list, and upon discussion with patient.   Admitted to the hospital on 05/19/21 due to Vitreous floaters of right eye. Discharge date was 05/19/21. Discharged from [place]?Medications Started at [facility] Discharge:?? None noted   Medication Changes at Hospital Discharge: None noted   Medications Discontinued at Hospital Discharge: None noted.   Medications that remain the same after Hospital Discharge:??  All other medications will remain the same.     Objective:  Lab Results  Component Value Date   CREATININE 1.27 01/08/2021   BUN 15 01/08/2021   EGFR 60 01/08/2021   GFRNONAA 62 10/16/2020   GFRAA 71 10/16/2020   NA 139 01/08/2021   K 4.2 01/08/2021   CALCIUM 9.7 01/08/2021   CO2 29 01/08/2021   GLUCOSE 197 (H) 01/08/2021    Lab Results  Component Value Date/Time   HGBA1C 7.3 (H) 04/09/2021 08:24 AM   HGBA1C 7.4 (H) 01/08/2021 09:03 AM   HGBA1C 7.0 (H) 07/10/2013  08:22 AM   HGBA1C 7.3 (H) 04/07/2013 08:43 AM   MICROALBUR 2.3 06/26/2020 09:21 AM   MICROALBUR 0.5 10/10/2018 08:17 AM    Last diabetic Eye exam:  Lab Results  Component Value Date/Time   HMDIABEYEEXA No Retinopathy 10/31/2020 08:41 AM    Last diabetic Foot exam:  Lab Results  Component Value Date/Time    HMDIABFOOTEX normal 06/09/2011 12:00 AM     Lab Results  Component Value Date   CHOL 217 (H) 01/08/2021   HDL 45 01/08/2021   LDLCALC 141 (H) 01/08/2021   LDLDIRECT 94 06/09/2011   TRIG 170 (H) 01/08/2021   CHOLHDL 4.8 01/08/2021    Hepatic Function Latest Ref Rng & Units 10/16/2020 06/26/2020 02/27/2020  Total Protein 6.1 - 8.1 g/dL 6.8 6.9 6.7  Albumin 3.6 - 5.1 g/dL - - -  AST 10 - 35 U/L 18 22 15   ALT 9 - 46 U/L 22 29 17   Alk Phosphatase 40 - 115 U/L - - -  Total Bilirubin 0.2 - 1.2 mg/dL 0.7 0.6 0.8    No results found for: TSH, FREET4  CBC Latest Ref Rng & Units 06/26/2020 02/27/2020 11/06/2019  WBC 3.8 - 10.8 Thousand/uL 4.8 5.2 5.7  Hemoglobin 13.2 - 17.1 g/dL 13.9 13.6 14.3  Hematocrit 38.5 - 50.0 % 41.5 41.1 43.2  Platelets 140 - 400 Thousand/uL 239 205 203    No results found for: VD25OH  Clinical ASCVD: No  The 10-year ASCVD risk score (Arnett DK, et al., 2019) is: 36.5%   Values used to calculate the score:     Age: 71 years     Sex: Male     Is Non-Hispanic African American: Yes     Diabetic: Yes     Tobacco smoker: No     Systolic Blood Pressure: 767 mmHg     Is BP treated: Yes     HDL Cholesterol: 45 mg/dL     Total Cholesterol: 217 mg/dL    Depression screen [facility] 2/9 01/08/2021 07/18/2020 02/27/2020  Decreased Interest 0 0 0  Down, Depressed, Hopeless 0 0 0  PHQ - 2 Score 0 0 0  Altered sleeping - 0 -  Tired, decreased energy - 0 -  Change in appetite - 0 -  Feeling bad or failure about yourself  - 0 -  Trouble concentrating - 0 -  Moving slowly or fidgety/restless - 0 -  Suicidal thoughts - 0 -  PHQ-9 Score - 0 -  Difficult doing work/chores - Not difficult at all -     Social History   Tobacco Use  Smoking Status Former  Smokeless Tobacco Never   BP Readings from Last 3 Encounters:  05/19/21 130/82  04/09/21 128/78  01/08/21 130/84   Pulse Readings from Last 3 Encounters:  05/19/21 81  04/09/21 85  01/08/21 83   Wt Readings from  Last 3 Encounters:  05/19/21 279 lb 1.6 oz (126.6 kg)  04/09/21 279 lb (126.6 kg)  01/08/21 285 lb (129.3 kg)   BMI Readings from Last 3 Encounters:  05/19/21 37.85 kg/m  04/09/21 40.03 kg/m  01/08/21 40.89 kg/m    Assessment/Interventions: Review of patient past medical history, allergies, medications, health status, including review of consultants reports, laboratory and other test data, was performed as part of comprehensive evaluation and provision of chronic care management services.   SDOH:  (Social Determinants of Health) assessments and interventions performed: Yes  Financial Resource Strain: Not on file    SDOH Screenings  Alcohol Screen: Not on file  Depression (PHQ2-9): Low Risk    PHQ-2 Score: 0  Financial Resource Strain: Not on file  Food Insecurity: Not on file  Housing: Not on file  Physical Activity: Not on file  Social Connections: Not on file  Stress: Not on file  Tobacco Use: Medium Risk   Smoking Tobacco Use: Former   Smokeless Tobacco Use: Never   Passive Exposure: Not on file  Transportation Needs: Not on file    [place]  Allergies  Allergen Reactions   Aspirin     Nose Bleeds    Lipitor [Atorvastatin]    Codeine Palpitations    'heart beats real fast'    Medications Reviewed Today     Reviewed by Edythe Clarity, [facility] (Pharmacist) on 05/29/21 at 1137  Med List Status: <None>   Medication Order Taking? Sig Documenting Provider Last Dose Status Informant  Calcium Carb-Cholecalciferol (CALCIUM 500/D PO) [id number] Yes Take 1 tablet by mouth daily. [provider] Taking Active Self  Cholecalciferol (VITAMIN D3) 1000 units CAPS [id number] Yes Take 1 capsule by mouth daily. [provider] Taking Active   Cinnamon 500 MG capsule [id number] Yes Take 500 mg by mouth daily as needed. [provider] Taking Active   glipiZIDE (GLUCOTROL XL) 10 MG 24 hr tablet [id number] Yes Take 1 tablet (10 mg total) by mouth  daily with breakfast. Eulogio Bear, NP Taking Active   lisinopril-hydrochlorothiazide (ZESTORETIC) 20-25 MG tablet [id number] Yes Take 1 tablet by mouth daily. Eulogio Bear, NP Taking Active   Multiple Vitamin (MULTIVITAMIN) capsule [id number] Yes Take 1 capsule by mouth daily. [provider] Taking Active Self  Omega-3 Fatty Acids (FISH OIL) 1000 MG CAPS [id number] Yes Take 1 capsule (1,000 mg total) by mouth daily. [place], Modena Nunnery, MD Taking Active   SitaGLIPtin-MetFORMIN HCl (JANUMET XR) 780 777 1340 MG TB24 [id number]  Take 1 tablet by mouth daily. Eulogio Bear, NP  Expired 03/15/21 2359   vitamin B-12 (CYANOCOBALAMIN) 1000 MCG tablet [id number] Yes Take 1,000 mcg by mouth daily. [provider] Taking Active             Patient Active Problem List   Diagnosis Date Noted   CKD (chronic kidney disease), stage II 11/14/2018   Myalgia due to HMG CoA reductase inhibitor 08/02/2017   Bell's palsy 11/13/2014   Pain in joint, shoulder region 07/16/2014   Boil of buttock 04/07/2013   Hemorrhagic cyst 06/10/2011   Hematuria 06/09/2011   Type 2 DM with CKD stage 2 and hypertension ([place]) 03/14/2009   Hyperlipidemia 03/14/2009   Obesity 03/14/2009   HYPERTENSION, BENIGN 03/14/2009    Immunization History  Administered Date(s) Administered   Moderna SARS-COV2 Booster Vaccination 12/19/2019   Moderna Sars-Covid-2 Vaccination 11/23/2019, 11/23/2019, 12/19/2019, 05/28/2020, 05/28/2020    Conditions to be addressed/monitored:  Type II DM w/ CKD, HTN, HLD,  Care Plan : General Pharmacy (Adult)  Updates made by Edythe Clarity, RPH since 05/29/2021 12:00 AM     Problem: Type II DM w/ CKD, HTN, HLD,   Priority: High  Onset Date: 05/29/2021     Long-Range Goal: Patient-Specific Goal   Start Date: 05/29/2021  Expected End Date: 09/26/2021  This Visit's Progress: On track  Priority: High  Note:   Current Barriers:  Unable to achieve control of DM   Suboptimal therapeutic regimen for DM  Pharmacist Clinical Goal(s):  Patient will achieve control of DM as evidenced by A1c through collaboration with PharmD  and provider.   Interventions: 1:1 collaboration with Eulogio Bear, NP regarding development and update of comprehensive plan of care as evidenced by provider attestation and co-signature Inter-disciplinary care team collaboration (see longitudinal plan of care) Comprehensive medication review performed; medication list updated in electronic medical record  Hypertension (BP goal <130/80) -Controlled -Current treatment: Lisinopril/HCTZ 20-64m daily Appropriate, Effective, Safe, Accessible -Medications previously tried: none noted  -Current home readings: controlled 110/120-70s -Current dietary habits: see DM -Current exercise habits: not as much lately, usually very active helping at church etc. -Denies hypotensive/hypertensive symptoms -Educated on BP goals and benefits of medications for prevention of heart attack, stroke and kidney damage; Exercise goal of 150 minutes per week; Importance of home blood pressure monitoring; Symptoms of hypotension and importance of maintaining adequate hydration; -Counseled to monitor BP at home weekly, document, and provide log at future appointments -Recommended to continue current medication  Hyperlipidemia: (LDL goal < 100) -Uncontrolled -Current treatment: None -Medications previously tried: Lipitor   -Current dietary patterns: see DM -Current exercise habits: see HTN -Educated on Cholesterol goals;  Benefits of statin for ASCVD risk reduction; Importance of limiting foods high in cholesterol; -He has had issues with statins in the past and does not want to take medication at this time.  Zetia has been discussed and he does not want to add any medication at this time.  Stressed importance of lowering LDL - work really hard on diet and exercise to try and get it down! ASCVD  risk is 36.5% - high risk May need discussion of Repatha if still elevated. Recheck lipids in 3 months.  Diabetes (A1c goal <7%) -Uncontrolled -Current medications: Glipizide XL 133mdaily Query Appropriate, Janumet XR 10-10076maily Appropriate, Query effective  -Medications previously tried: Jardiance - nose bleeds, rapid HR?  -Current home glucose readings fasting glucose: no logs today, states 160-180 lately during a cold post prandial glucose: 120s before bed (unclear how long after a meal) -Denies hypoglycemic/hyperglycemic symptoms -Current meal patterns:  breakfast: oatmeal, tomato sandwich  lunch: sandwich  dinner: full course meal, biscuits, crackers snacks: popsicles drinks: water, juice right now during cold -Current exercise: minimal due to weather -Educated on A1c and blood sugar goals; Exercise goal of 150 minutes per week; Prevention and management of hypoglycemic episodes; Benefits of routine self-monitoring of blood sugar; -Counseled to check feet daily and get yearly eye exams -Recommended to continue current medication Assessed patient finances. Will look to assist with Janumet copay I would love to see him on Jardiance but he politely declines to retry due to previous experience.  Will look to improve diet/exercise and increase access to Janumet. Patient Goals/Self-Care Activities Patient will:  - take medications as prescribed as evidenced by patient report and record review check glucose daily, document, and provide at future appointments collaborate with provider on medication access solutions target a minimum of 150 minutes of moderate intensity exercise weekly  Follow Up Plan: The care management team will reach out to the patient again over the next 120 days.       Medication Assistance: None required.  Patient affirms current coverage meets needs.  Compliance/Adherence/Medication fill history: Care Gaps: None noted  Star-Rating  Drugs: Lisinopril/HCTZ 03/28/21 90ds  Patient's preferred pharmacy is:  [id number] [employer] [employer] [address]: [phone]: [phone] ([place] [address]  [place] [address] Phone: [phone] Fax: [phone]  [facility] ([facility] ) - [place], [address] [place] [address] Phone: [phone] Fax: [phone]  Uses pill box? No - takes out of vials Pt endorses 100% compliance  We discussed: Current pharmacy is preferred with insurance plan and patient is satisfied with pharmacy services Patient decided to: Continue current medication management strategy  Care Plan and Follow Up Patient Decision:  Patient agrees to Care Plan and Follow-up.  Plan: The care management team will reach out to the patient again over the next 120 days.  Beverly Milch, PharmD, CPP Clinical Pharmacist Practitioner [place] [phone]

## 2021-05-29 ENCOUNTER — Ambulatory Visit (INDEPENDENT_AMBULATORY_CARE_PROVIDER_SITE_OTHER): Payer: Medicare Other | Admitting: Pharmacist

## 2021-05-29 ENCOUNTER — Other Ambulatory Visit: Payer: Self-pay

## 2021-05-29 DIAGNOSIS — E1122 Type 2 diabetes mellitus with diabetic chronic kidney disease: Secondary | ICD-10-CM

## 2021-05-29 DIAGNOSIS — I10 Essential (primary) hypertension: Secondary | ICD-10-CM

## 2021-05-29 DIAGNOSIS — E7800 Pure hypercholesterolemia, unspecified: Secondary | ICD-10-CM

## 2021-05-29 NOTE — Patient Instructions (Addendum)
Visit Information   Goals Addressed             This Visit's Progress    Monitor and Manage My Blood Sugar-Diabetes Type 2       Timeframe:  Long-Range Goal Priority:  High Start Date:  05/29/21                           Expected End Date:  11/26/21                     Follow Up Date 08/26/21    - check blood sugar at prescribed times - check blood sugar if I feel it is too high or too low - enter blood sugar readings and medication or insulin into daily log    Why is this important?   Checking your blood sugar at home helps to keep it from getting very high or very low.  Writing the results in a diary or log helps the doctor know how to care for you.  Your blood sugar log should have the time, date and the results.  Also, write down the amount of insulin or other medicine that you take.  Other information, like what you ate, exercise done and how you were feeling, will also be helpful.     Notes:        Patient Care Plan: General Pharmacy (Adult)     Problem Identified: Type II DM w/ CKD, HTN, HLD,   Priority: High  Onset Date: 05/29/2021     Long-Range Goal: Patient-Specific Goal   Start Date: 05/29/2021  Expected End Date: 09/26/2021  This Visit's Progress: On track  Priority: High  Note:   Current Barriers:  Unable to achieve control of DM  Suboptimal therapeutic regimen for DM  Pharmacist Clinical Goal(s):  Patient will achieve control of DM as evidenced by A1c through collaboration with PharmD and provider.   Interventions: 1:1 collaboration with Eulogio Bear, NP regarding development and update of comprehensive plan of care as evidenced by provider attestation and co-signature Inter-disciplinary care team collaboration (see longitudinal plan of care) Comprehensive medication review performed; medication list updated in electronic medical record  Hypertension (BP goal <130/80) -Controlled -Current treatment: Lisinopril/HCTZ 20-25mg  daily  Appropriate, Effective, Safe, Accessible -Medications previously tried: none noted  -Current home readings: controlled 110/120-70s -Current dietary habits: see DM -Current exercise habits: not as much lately, usually very active helping at church etc. -Denies hypotensive/hypertensive symptoms -Educated on BP goals and benefits of medications for prevention of heart attack, stroke and kidney damage; Exercise goal of 150 minutes per week; Importance of home blood pressure monitoring; Symptoms of hypotension and importance of maintaining adequate hydration; -Counseled to monitor BP at home weekly, document, and provide log at future appointments -Recommended to continue current medication  Hyperlipidemia: (LDL goal < 100) -Uncontrolled -Current treatment: None -Medications previously tried: Lipitor   -Current dietary patterns: see DM -Current exercise habits: see HTN -Educated on Cholesterol goals;  Benefits of statin for ASCVD risk reduction; Importance of limiting foods high in cholesterol; -He has had issues with statins in the past and does not want to take medication at this time.  Zetia has been discussed and he does not want to add any medication at this time.  Stressed importance of lowering LDL - work really hard on diet and exercise to try and get it down! ASCVD risk is 36.5% - high risk May need discussion of  Repatha if still elevated. Recheck lipids in 3 months.  Diabetes (A1c goal <7%) -Uncontrolled -Current medications: Glipizide XL 10mg  daily Query Appropriate, Janumet XR 10-100mg  daily Appropriate, Query effective  -Medications previously tried: Jardiance - nose bleeds, rapid HR?  -Current home glucose readings fasting glucose: no logs today, states 160-180 lately during a cold post prandial glucose: 120s before bed (unclear how long after a meal) -Denies hypoglycemic/hyperglycemic symptoms -Current meal patterns:  breakfast: oatmeal, tomato sandwich  lunch:  sandwich  dinner: full course meal, biscuits, crackers snacks: popsicles drinks: water, juice right now during cold -Current exercise: minimal due to weather -Educated on A1c and blood sugar goals; Exercise goal of 150 minutes per week; Prevention and management of hypoglycemic episodes; Benefits of routine self-monitoring of blood sugar; -Counseled to check feet daily and get yearly eye exams -Recommended to continue current medication Assessed patient finances. Will look to assist with Janumet copay I would love to see him on Jardiance but he politely declines to retry due to previous experience.  Will look to improve diet/exercise and increase access to Janumet. Patient Goals/Self-Care Activities Patient will:  - take medications as prescribed as evidenced by patient report and record review check glucose daily, document, and provide at future appointments collaborate with provider on medication access solutions target a minimum of 150 minutes of moderate intensity exercise weekly  Follow Up Plan: The care management team will reach out to the patient again over the next 120 days.      Mr. Schoenherr was given information about Chronic Care Management services today including:  CCM service includes personalized support from designated clinical staff supervised by his physician, including individualized plan of care and coordination with other care providers 24/7 contact phone numbers for assistance for urgent and routine care needs. Standard insurance, coinsurance, copays and deductibles apply for chronic care management only during months in which we provide at least 20 minutes of these services. Most insurances cover these services at 100%, however patients may be responsible for any copay, coinsurance and/or deductible if applicable. This service may help you avoid the need for more expensive face-to-face services. Only one practitioner may furnish and bill the service in a calendar  month. The patient may stop CCM services at any time (effective at the end of the month) by phone call to the office staff.  Patient agreed to services and verbal consent obtained.   The patient verbalized understanding of instructions, educational materials, and care plan provided today and agreed to receive a mailed copy of patient instructions, educational materials, and care plan.  Telephone follow up appointment with pharmacy team member scheduled for: 4 months  Edythe Clarity, [place], PharmD, [place] Clinical Pharmacist Practitioner [place] [phone]

## 2021-06-02 ENCOUNTER — Telehealth: Payer: Self-pay

## 2021-06-02 MED ORDER — GLIPIZIDE ER 10 MG PO TB24: 10.0000 mg | ORAL_TABLET | Freq: Every day | ORAL | 3 refills | Status: DC

## 2021-06-02 MED ORDER — LISINOPRIL-HYDROCHLOROTHIAZIDE 20-25 MG PO TABS: 1.0000 | ORAL_TABLET | Freq: Every day | ORAL | 3 refills | Status: DC

## 2021-06-02 NOTE — Telephone Encounter (Signed)
-----   Message from Edythe Clarity, [facility] sent at 06/02/2021  3:05 PM EST ----- Patient requested his Lisinopril and glipizide to be called in to mail order pharmacy due to cost effectiveness!  Thanks!  Beverly Milch, PharmD, CPP Clinical Pharmacist Practitioner [place] [phone]

## 2021-06-02 NOTE — Telephone Encounter (Signed)
Refills sent

## 2021-06-18 ENCOUNTER — Encounter (INDEPENDENT_AMBULATORY_CARE_PROVIDER_SITE_OTHER): Payer: Self-pay | Admitting: *Deleted

## 2021-06-24 DIAGNOSIS — E1122 Type 2 diabetes mellitus with diabetic chronic kidney disease: Secondary | ICD-10-CM

## 2021-06-24 DIAGNOSIS — I10 Essential (primary) hypertension: Secondary | ICD-10-CM | POA: Diagnosis not present

## 2021-06-24 DIAGNOSIS — E7800 Pure hypercholesterolemia, unspecified: Secondary | ICD-10-CM

## 2021-06-24 DIAGNOSIS — N182 Chronic kidney disease, stage 2 (mild): Secondary | ICD-10-CM

## 2021-06-24 DIAGNOSIS — I129 Hypertensive chronic kidney disease with stage 1 through stage 4 chronic kidney disease, or unspecified chronic kidney disease: Secondary | ICD-10-CM | POA: Diagnosis not present

## 2021-07-04 ENCOUNTER — Ambulatory Visit (INDEPENDENT_AMBULATORY_CARE_PROVIDER_SITE_OTHER): Payer: Medicare Other | Admitting: Nurse Practitioner

## 2021-07-04 ENCOUNTER — Other Ambulatory Visit: Payer: Self-pay

## 2021-07-04 ENCOUNTER — Encounter: Payer: Self-pay | Admitting: Nurse Practitioner

## 2021-07-04 VITALS — BP 138/80 | HR 88 | Resp 16 | Ht 71.0 in | Wt 285.0 lb

## 2021-07-04 DIAGNOSIS — E7800 Pure hypercholesterolemia, unspecified: Secondary | ICD-10-CM

## 2021-07-04 DIAGNOSIS — N182 Chronic kidney disease, stage 2 (mild): Secondary | ICD-10-CM | POA: Diagnosis not present

## 2021-07-04 DIAGNOSIS — E1122 Type 2 diabetes mellitus with diabetic chronic kidney disease: Secondary | ICD-10-CM

## 2021-07-04 DIAGNOSIS — I10 Essential (primary) hypertension: Secondary | ICD-10-CM | POA: Diagnosis not present

## 2021-07-04 DIAGNOSIS — I129 Hypertensive chronic kidney disease with stage 1 through stage 4 chronic kidney disease, or unspecified chronic kidney disease: Secondary | ICD-10-CM | POA: Diagnosis not present

## 2021-07-04 NOTE — Progress Notes (Signed)
New Patient Office Visit  Subjective:  Patient ID: [name] [name], male    DOB: 07/04/1950  Age: 71 y.o. MRN: [id number]  CC:  Chief Complaint  Patient presents with   Establish Care    HPI [name] [name] with medical history of type 2 diabetes with CKD stage II and hypertension, benign hypertension, hyperlipidemia presents to establish care for his chronic medical conditions.   Hypertension.  Takes lisinopril-hydrochlorothiazide 20-25 mg tablet daily, patient is states that he checks his blood pressure twice at home, blood pressure has been well controlled at home.  Patient denies chest pain palpitation dizziness edema.   Type 2 diabetes with CKD stage II.  Takes glipizide 10 mg daily, Janumet XR100-1000 mg daily.  Patient reports checking blood sugar twice daily at home.  Patient states that he rarely experience hypoglycemic episodes.  Highest fasting blood sugar was 169.  Patient denies polydipsia polyuria visual disturbances, decreased urination, edema. up-to-date with diabetic eye exam.  Due for diabetic foot exam.   Hyperlipidemia.  Takes omega-3 fatty acid 1000 mg capsule daily.  Patient allergic  to statin.   Patient is due for shingles vaccine, influenza vaccine pneumonia vaccine, Tdap vaccine, COVID-19 booster vaccine.  Patient refused all vaccines due ,the need for all vaccines discussed with patient today.  Past Medical History:  Diagnosis Date   Diabetes mellitus    Hyperlipidemia    Hypertension    Renal cyst 2013   Left hemorrhagic hilar cyst    Past Surgical History:  Procedure Laterality Date   COLONOSCOPY N/A 06/28/2014   Procedure: COLONOSCOPY;  Surgeon: Rogene Houston, MD;  Location: [facility];  Service: Endoscopy;  Laterality: N/A;  44   NASAL SINUS SURGERY      Family History  Problem Relation Age of Onset   Cancer Mother    Diabetes Sister    Hypertension Sister    Diabetes Sister    Cancer Daughter     Social History   Socioeconomic History    Marital status: Married    Spouse name: Not on file   Number of children: Not on file   Years of education: Not on file   Highest education level: Not on file  Occupational History   Not on file  Tobacco Use   Smoking status: Former    Passive exposure: Past   Smokeless tobacco: Never   Tobacco comments:    Quit 40 years ago   Vaping Use   Vaping Use: Never used  Substance and Sexual Activity   Alcohol use: No    Alcohol/week: 0.0 standard drinks   Drug use: No   Sexual activity: Yes  Other Topics Concern   Not on file  Social History Narrative   Married for 45 years.Lives with wife.Retired.   Social Determinants of Health   Financial Resource Strain: Not on file  Food Insecurity: Not on file  Transportation Needs: Not on file  Physical Activity: Not on file  Stress: Not on file  Social Connections: Not on file  Intimate Partner Violence: Not on file    ROS Review of Systems  Constitutional: Negative.   Respiratory: Negative.    Cardiovascular: Negative.   Endocrine: Negative.   Genitourinary: Negative.   Neurological: Negative.   Psychiatric/Behavioral: Negative.     Objective:   Today's Vitals: BP 138/80    Pulse 88    Resp 16    Ht 5' 11"  (1.803 m)    Wt 285 lb (129.3 kg)  SpO2 95%    BMI 39.75 kg/m   Physical Exam Constitutional:      General: He is not in acute distress.    Appearance: He is obese. He is not ill-appearing, toxic-appearing or diaphoretic.  Cardiovascular:     Rate and Rhythm: Normal rate and regular rhythm.     Pulses: Normal pulses.     Heart sounds: Normal heart sounds. No murmur heard.   No friction rub. No gallop.  Pulmonary:     Effort: Pulmonary effort is normal. No respiratory distress.     Breath sounds: Normal breath sounds. No stridor. No wheezing, rhonchi or rales.  Chest:     Chest wall: No tenderness.  Abdominal:     Palpations: Abdomen is soft.  Neurological:     Mental Status: He is alert and oriented to person,  place, and time.     Cranial Nerves: No cranial nerve deficit.     Sensory: No sensory deficit.     Motor: No weakness.     Coordination: Coordination normal.     Gait: Gait normal.     Deep Tendon Reflexes: Reflexes normal.  Psychiatric:        Mood and Affect: Mood normal.        Behavior: Behavior normal.        Thought Content: Thought content normal.        Judgment: Judgment normal.    Assessment & Plan:   Problem List Items Addressed This Visit       Cardiovascular and Mediastinum   Type 2 DM with CKD stage 2 and hypertension ([place]) - Primary    Chronic condition Takes Janumet Exar [phone] mg tablet daily, glipizide 10 mg daily. Up-to-date with diabetic eye exam, Foot exam done today. On ARB Statin intolerant, takes omega-3 fatty acid 1000 mg Daily. Lab Results  Component Value Date   HGBA1C 7.3 (H) 04/09/2021  Patient reports rare hypoglycemic episodes at home.  Patient refused prescription for glucagon injection today.  Patient told to take 4 ounces of orange juice  as needed for hypoglycemic episodes.  Check A1c in 2 months.  Avoid sugar sweets soda.       Relevant Orders   HgB A1c   CBC with Differential   HYPERTENSION, BENIGN    BP Readings from Last 3 Encounters:  07/04/21 138/80  05/19/21 130/82  04/09/21 128/78  Chronic condition, well-controlled Takes lisinopril-hydrochlorothiazide 20-25 mg tablet daily.  Blood pressure log at home which reviewed by me today, blood pressure has been well controlled at home mostly below 130/80. DASH diet advised engage in regular exercise 30 minutes daily 5 days a week. CMP plus EGFR in 2 months at next visit.  Continue current medication Lab Results  Component Value Date   NA 139 01/08/2021   K 4.2 01/08/2021   CO2 29 01/08/2021   GLUCOSE 197 (H) 01/08/2021   BUN 15 01/08/2021   CREATININE 1.27 01/08/2021   CALCIUM 9.7 01/08/2021   EGFR 60 01/08/2021   GFRNONAA 62 10/16/2020        Relevant Orders    CMP14+EGFR     Genitourinary   CKD (chronic kidney disease), stage II    Chronic condition, avoid nephrotoxic agents, drink at least 64 ounces of water daily. Check CMP plus EGFR at his next visit.  Lab Results  Component Value Date   NA 139 01/08/2021   K 4.2 01/08/2021   CO2 29 01/08/2021   GLUCOSE 197 (H) 01/08/2021  BUN 15 01/08/2021   CREATININE 1.27 01/08/2021   CALCIUM 9.7 01/08/2021   EGFR 60 01/08/2021   GFRNONAA 62 10/16/2020          Other   Hyperlipidemia    Takes omega-3 fatty acid 1000 mg capsule daily. Patient is allergic to statin. I discussed the need for ezetimbe or PCSK9-I to help decrease the risk of CVA, MI.  Patient was very adamant about starting new medications. Check lipid panel in 2 months. If LDL is not at goal I will reeducate patient on the need for additional  antihyperlipidemia agents. Avoid fried fatty foods, engage in regular vigorous exercise.  Lab Results  Component Value Date   CHOL 217 (H) 01/08/2021   HDL 45 01/08/2021   LDLCALC 141 (H) 01/08/2021   LDLDIRECT 94 06/09/2011   TRIG 170 (H) 01/08/2021   CHOLHDL 4.8 01/08/2021        Relevant Orders   Lipid Profile    Outpatient Encounter Medications as of 07/04/2021  Medication Sig   Calcium Carb-Cholecalciferol (CALCIUM 500/D PO) Take 1 tablet by mouth daily.   Cholecalciferol (VITAMIN D3) 1000 units CAPS Take 1 capsule by mouth daily.   Cinnamon 500 MG capsule Take 500 mg by mouth daily as needed.   glipiZIDE (GLUCOTROL XL) 10 MG 24 hr tablet Take 1 tablet (10 mg total) by mouth daily with breakfast.   lisinopril-hydrochlorothiazide (ZESTORETIC) 20-25 MG tablet Take 1 tablet by mouth daily.   Multiple Vitamin (MULTIVITAMIN) capsule Take 1 capsule by mouth daily.   Omega-3 Fatty Acids (FISH OIL) 1000 MG CAPS Take 1 capsule (1,000 mg total) by mouth daily.   SitaGLIPtin-MetFORMIN HCl (JANUMET XR) [phone] MG TB24 Take 1 tablet by mouth daily.   vitamin B-12 (CYANOCOBALAMIN) 1000  MCG tablet Take 1,000 mcg by mouth daily.   No facility-administered encounter medications on file as of 07/04/2021.    Follow-up: Return in about 2 months (around 09/03/2021) for fu fro HTN/DM/HLD.   Renee Rival, FNP

## 2021-07-04 NOTE — Patient Instructions (Signed)
Please get your labs done 3-5 days before your next visit.  ? ? ?It is important that you exercise regularly at least 30 minutes 5 times a week.  ?Think about what you will eat, plan ahead. ?Choose " clean, green, fresh or frozen" over canned, processed or packaged foods which are more sugary, salty and fatty. ?70 to 75% of food eaten should be vegetables and fruit. ?Three meals at set times with snacks allowed between meals, but they must be fruit or vegetables. ?Aim to eat over a 12 hour period , example 7 am to 7 pm, and STOP after  your last meal of the day. ?Drink water,generally about 64 ounces per day, no other drink is as healthy. Fruit juice is best enjoyed in a healthy way, by EATING the fruit. ? ?Thanks for choosing [facility], we consider it a privelige to serve you. ? ?

## 2021-07-04 NOTE — Assessment & Plan Note (Addendum)
Chronic condition ?Takes Janumet Exar [phone] mg tablet daily, glipizide 10 mg daily. ?Up-to-date with diabetic eye exam, Foot exam done today. ?On ARB ?Statin intolerant, takes omega-3 fatty acid 1000 mg Daily. ?Lab Results  ?Component Value Date  ? HGBA1C 7.3 (H) 04/09/2021  ?Patient reports rare hypoglycemic episodes at home.  Patient refused prescription for glucagon injection today.  Patient told to take 4 ounces of orange juice  as needed for hypoglycemic episodes.  ?Check A1c in 2 months.  ?Avoid sugar sweets soda. ? ?

## 2021-07-06 ENCOUNTER — Encounter: Payer: Self-pay | Admitting: Nurse Practitioner

## 2021-07-06 NOTE — Assessment & Plan Note (Signed)
Takes omega-3 fatty acid 1000 mg capsule daily. ?Patient is allergic to statin. ?I discussed the need for ezetimbe or PCSK9-I to help decrease the risk of CVA, MI.  Patient was very adamant about starting new medications. ?Check lipid panel in 2 months. ?If LDL is not at goal I will reeducate patient on the need for additional  antihyperlipidemia agents. ?Avoid fried fatty foods, engage in regular vigorous exercise.  ?Lab Results  ?Component Value Date  ? CHOL 217 (H) 01/08/2021  ? HDL 45 01/08/2021  ? LDLCALC 141 (H) 01/08/2021  ? LDLDIRECT 94 06/09/2011  ? TRIG 170 (H) 01/08/2021  ? CHOLHDL 4.8 01/08/2021  ? ?

## 2021-07-06 NOTE — Assessment & Plan Note (Signed)
Chronic condition, avoid nephrotoxic agents, drink at least 64 ounces of water daily. ?Check CMP plus EGFR at his next visit.  ?Lab Results  ?Component Value Date  ? NA 139 01/08/2021  ? K 4.2 01/08/2021  ? CO2 29 01/08/2021  ? GLUCOSE 197 (H) 01/08/2021  ? BUN 15 01/08/2021  ? CREATININE 1.27 01/08/2021  ? CALCIUM 9.7 01/08/2021  ? EGFR 60 01/08/2021  ? GFRNONAA 62 10/16/2020  ? ?

## 2021-07-06 NOTE — Assessment & Plan Note (Signed)
BP Readings from Last 3 Encounters:  ?07/04/21 138/80  ?05/19/21 130/82  ?04/09/21 128/78  ? ?Chronic condition, well-controlled ?Takes lisinopril-hydrochlorothiazide 20-25 mg tablet daily.  Blood pressure log at home which reviewed by me today, blood pressure has been well controlled at home mostly below 130/80. ?DASH diet advised engage in regular exercise 30 minutes daily 5 days a week. ?CMP plus EGFR in 2 months at next visit.  ?Continue current medication ?Lab Results  ?Component Value Date  ? NA 139 01/08/2021  ? K 4.2 01/08/2021  ? CO2 29 01/08/2021  ? GLUCOSE 197 (H) 01/08/2021  ? BUN 15 01/08/2021  ? CREATININE 1.27 01/08/2021  ? CALCIUM 9.7 01/08/2021  ? EGFR 60 01/08/2021  ? GFRNONAA 62 10/16/2020  ? ? ?

## 2021-07-07 ENCOUNTER — Encounter (INDEPENDENT_AMBULATORY_CARE_PROVIDER_SITE_OTHER): Payer: Self-pay | Admitting: *Deleted

## 2021-07-07 ENCOUNTER — Other Ambulatory Visit (INDEPENDENT_AMBULATORY_CARE_PROVIDER_SITE_OTHER): Payer: Self-pay

## 2021-07-07 ENCOUNTER — Telehealth (INDEPENDENT_AMBULATORY_CARE_PROVIDER_SITE_OTHER): Payer: Self-pay | Admitting: *Deleted

## 2021-07-07 DIAGNOSIS — Z86010 Personal history of colonic polyps: Secondary | ICD-10-CM

## 2021-07-07 NOTE — Telephone Encounter (Signed)
Patient needs trilyte 

## 2021-07-08 MED ORDER — PEG 3350-KCL-NA BICARB-NACL 420 G PO SOLR: 4000.0000 mL | Freq: Once | ORAL | 0 refills | Status: AC

## 2021-07-09 ENCOUNTER — Ambulatory Visit: Payer: Medicare Other | Admitting: Nurse Practitioner

## 2021-07-10 ENCOUNTER — Encounter (INDEPENDENT_AMBULATORY_CARE_PROVIDER_SITE_OTHER): Payer: Self-pay | Admitting: *Deleted

## 2021-07-10 ENCOUNTER — Telehealth (INDEPENDENT_AMBULATORY_CARE_PROVIDER_SITE_OTHER): Payer: Self-pay | Admitting: *Deleted

## 2021-07-10 NOTE — Telephone Encounter (Signed)
Referring MD/PCP: paseda ? ?Procedure: tcs ? ?Reason/Indication:  hx polyps ? ?Has patient had this procedure before?  Yes, 06/2014 ? If so, when, by whom and where?   ? ?Is there a family history of colon cancer?  no ? Who?  What age when diagnosed?   ? ?Is patient diabetic? If yes, Type 1 or Type 2   yes, type 2no ?     ?Does patient have prosthetic heart valve or mechanical valve?  no ? ?Do you have a pacemaker/defibrillator?  no ? ?Has patient ever had endocarditis/atrial fibrillation? no ? ?Does patient use oxygen? no ? ?Has patient had joint replacement within last 12 months?  no ? ?Is patient constipated or do they take laxatives? no ? ?Does patient have a history of alcohol/drug use?  no ? ?Have you had a stroke/heart attack last 6 mths? no ? ?Do you take medicine for weight loss?  no ? ?For male patients,: have you had a hysterectomy  ?                     are you post menopausal  ?                     do you still have your menstrual cycle  ? ?Is patient on blood thinner such as Coumadin, Plavix and/or Aspirin? no ? ?Medications: cinnamon 500 mg daily, fish oil 1000 mg daily, mvi daily, vit b12 1000 mg daily, vit d3 25 mcg daily, glipizide 10 mg daily, janumet 100/1000 mg daily lisinopril/hctz 20/25 mg daily ? ?Allergies: codeine ? ?Medication Adjustment per Dr Rehman/Dr Jenetta Downer hold glipizide & janumet evening before and morning of procedure ? ?Procedure date & time: 08/06/21 ? ? ?

## 2021-07-15 ENCOUNTER — Telehealth (INDEPENDENT_AMBULATORY_CARE_PROVIDER_SITE_OTHER): Payer: Self-pay

## 2021-07-15 MED ORDER — PEG 3350-KCL-NA BICARB-NACL 420 G PO SOLR: 4000.0000 mL | ORAL | 0 refills | Status: DC

## 2021-07-15 NOTE — Telephone Encounter (Signed)
Alasha Mcguinness Ann Dacey Milberger, CMA  ?

## 2021-07-29 NOTE — Patient Instructions (Signed)
? ? ? ? ? ? [name] [name] ? 07/29/2021  ?  ? '@PREFPERIOPPHARMACY'$ @ ? ? Your procedure is scheduled on  08/06/2021. ? ? Report to [facility] at  1130 A.M. ? ? Call this number if you have problems the morning of surgery: ? [phone] ? ? Remember: ? Follow the diet and prep instructions given to you by the office. ? ?DO NOT take any medications for diabetes the morning of your procedure. ?  ? Take these medicines the morning of surgery with A SIP OF WATER  ? ?None. ?  ? Do not wear jewelry, make-up or nail polish. ? Do not wear lotions, powders, or perfumes, or deodorant. ? Do not shave 48 hours prior to surgery.  Men may shave face and neck. ? Do not bring valuables to the hospital. ? [facility] is not responsible for any belongings or valuables. ? ?Contacts, dentures or bridgework may not be worn into surgery.  Leave your suitcase in the car.  After surgery it may be brought to your room. ? ?For patients admitted to the hospital, discharge time will be determined by your treatment team. ? ?Patients discharged the day of surgery will not be allowed to drive home and must have someone with them for 24 hours.  ? ? ?Special instructions:   DO NOT smoke tobacco or vape for 24 hours before your procedure. ? ?Please read over the following fact sheets that you were given. ?Anesthesia Post-op Instructions and Care and Recovery After Surgery ?  ? ? ? Colonoscopy, Adult, Care After ?This sheet gives you information about how to care for yourself after your procedure. Your health care provider may also give you more specific instructions. If you have problems or questions, contact your health care provider. ?What can I expect after the procedure? ?After the procedure, it is common to have: ?A small amount of blood in your stool for 24 hours after the procedure. ?Some gas. ?Mild cramping or bloating of your abdomen. ?Follow these instructions at home: ?Eating and drinking ? ?Drink enough fluid to keep your urine pale  yellow. ?Follow instructions from your health care provider about eating or drinking restrictions. ?Resume your normal diet as instructed by your health care provider. Avoid heavy or fried foods that are hard to digest. ?Activity ?Rest as told by your health care provider. ?Avoid sitting for a long time without moving. Get up to take short walks every 1-2 hours. This is important to improve blood flow and breathing. Ask for help if you feel weak or unsteady. ?Return to your normal activities as told by your health care provider. Ask your health care provider what activities are safe for you. ?Managing cramping and bloating ? ?Try walking around when you have cramps or feel bloated. ?Apply heat to your abdomen as told by your health care provider. Use the heat source that your health care provider recommends, such as a moist heat pack or a heating pad. ?Place a towel between your skin and the heat source. ?Leave the heat on for 20-30 minutes. ?Remove the heat if your skin turns bright red. This is especially important if you are unable to feel pain, heat, or cold. You may have a greater risk of getting burned. ?General instructions ?If you were given a sedative during the procedure, it can affect you for several hours. Do not drive or operate machinery until your health care provider says that it is safe. ?For the first 24 hours after the procedure: ?Do  not sign important documents. ?Do not drink alcohol. ?Do your regular daily activities at a slower pace than normal. ?Eat soft foods that are easy to digest. ?Take over-the-counter and prescription medicines only as told by your health care provider. ?Keep all follow-up visits as told by your health care provider. This is important. ?Contact a health care provider if: ?You have blood in your stool 2-3 days after the procedure. ?Get help right away if you have: ?More than a small spotting of blood in your stool. ?Large blood clots in your stool. ?Swelling of your  abdomen. ?Nausea or vomiting. ?A fever. ?Increasing pain in your abdomen that is not relieved with medicine. ?Summary ?After the procedure, it is common to have a small amount of blood in your stool. You may also have mild cramping and bloating of your abdomen. ?If you were given a sedative during the procedure, it can affect you for several hours. Do not drive or operate machinery until your health care provider says that it is safe. ?Get help right away if you have a lot of blood in your stool, nausea or vomiting, a fever, or increased pain in your abdomen. ?This information is not intended to replace advice given to you by your health care provider. Make sure you discuss any questions you have with your health care provider. ?Document Revised: 02/17/2019 Document Reviewed: 11/07/2018 ?Elsevier Patient Education ? [place]. ?Monitored Anesthesia Care, Care After ?This sheet gives you information about how to care for yourself after your procedure. Your health care provider may also give you more specific instructions. If you have problems or questions, contact your health care provider. ?What can I expect after the procedure? ?After the procedure, it is common to have: ?Tiredness. ?Forgetfulness about what happened after the procedure. ?Impaired judgment for important decisions. ?Nausea or vomiting. ?Some difficulty with balance. ?Follow these instructions at home: ?For the time period you were told by your health care provider: ?  ?Rest as needed. ?Do not participate in activities where you could fall or become injured. ?Do not drive or use machinery. ?Do not drink alcohol. ?Do not take sleeping pills or medicines that cause drowsiness. ?Do not make important decisions or sign legal documents. ?Do not take care of children on your own. ?Eating and drinking ?Follow the diet that is recommended by your health care provider. ?Drink enough fluid to keep your urine pale yellow. ?If you vomit: ?Drink water,  juice, or soup when you can drink without vomiting. ?Make sure you have little or no nausea before eating solid foods. ?General instructions ?Have a responsible adult stay with you for the time you are told. It is important to have someone help care for you until you are awake and alert. ?Take over-the-counter and prescription medicines only as told by your health care provider. ?If you have sleep apnea, surgery and certain medicines can increase your risk for breathing problems. Follow instructions from your health care provider about wearing your sleep device: ?Anytime you are sleeping, including during daytime naps. ?While taking prescription pain medicines, sleeping medicines, or medicines that make you drowsy. ?Avoid smoking. ?Keep all follow-up visits as told by your health care provider. This is important. ?Contact a health care provider if: ?You keep feeling nauseous or you keep vomiting. ?You feel light-headed. ?You are still sleepy or having trouble with balance after 24 hours. ?You develop a rash. ?You have a fever. ?You have redness or swelling around the IV site. ?Get help right away if: ?  You have trouble breathing. ?You have new-onset confusion at home. ?Summary ?For several hours after your procedure, you may feel tired. You may also be forgetful and have poor judgment. ?Have a responsible adult stay with you for the time you are told. It is important to have someone help care for you until you are awake and alert. ?Rest as told. Do not drive or operate machinery. Do not drink alcohol or take sleeping pills. ?Get help right away if you have trouble breathing, or if you suddenly become confused. ?This information is not intended to replace advice given to you by your health care provider. Make sure you discuss any questions you have with your health care provider. ?Document Revised: 12/28/2019 Document Reviewed: 03/16/2019 ?Elsevier Patient Education ? [place]. ? ?

## 2021-07-31 ENCOUNTER — Encounter (HOSPITAL_COMMUNITY)
Admission: RE | Admit: 2021-07-31 | Discharge: 2021-07-31 | Disposition: A | Discharge status: Home / Self Care | Payer: Medicare Other | Source: Ambulatory Visit | Attending: Internal Medicine | Admitting: Internal Medicine

## 2021-07-31 DIAGNOSIS — Z86010 Personal history of colonic polyps: Secondary | ICD-10-CM | POA: Diagnosis not present

## 2021-07-31 DIAGNOSIS — Z01812 Encounter for preprocedural laboratory examination: Secondary | ICD-10-CM | POA: Insufficient documentation

## 2021-07-31 LAB — BASIC METABOLIC PANEL
Anion gap: 13 (ref 5–15)
BUN: 15 mg/dL (ref 8–23)
CO2: 25 mmol/L (ref 22–32)
Calcium: 9.7 mg/dL (ref 8.9–10.3)
Chloride: 98 mmol/L (ref 98–111)
Creatinine, Ser: 1.34 mg/dL — ABNORMAL HIGH (ref 0.61–1.24)
GFR, Estimated: 57 mL/min — ABNORMAL LOW (ref >60)
Glucose, Bld: 301 mg/dL — ABNORMAL HIGH (ref 70–99)
Potassium: 3.4 mmol/L — ABNORMAL LOW (ref 3.5–5.1)
Sodium: 136 mmol/L (ref 135–145)

## 2021-07-31 NOTE — Progress Notes (Signed)
?   07/31/21 1048  ?OBSTRUCTIVE SLEEP APNEA  ?Have you ever been diagnosed with sleep apnea through a sleep study? No  ?Do you snore loudly (loud enough to be heard through closed doors)?  1  ?Do you often feel tired, fatigued, or sleepy during the daytime (such as falling asleep during driving or talking to someone)? 0  ?Has anyone observed you stop breathing during your sleep? 0  ?Do you have, or are you being treated for high blood pressure? 1  ?BMI more than 35 kg/m2? 1  ?Age > 39 (1-yes) 1  ?Neck circumference greater than:Male 16 inches or larger, Male 17inches or larger? 0  ?Male Gender (Yes=1) 1  ?Obstructive Sleep Apnea Score 5  ?Score 5 or greater  Results sent to PCP  ? ? ?

## 2021-08-06 ENCOUNTER — Encounter (HOSPITAL_COMMUNITY)
Admission: RE | Disposition: A | Discharge status: Home / Self Care | Payer: Self-pay | Source: Home / Self Care | Attending: Internal Medicine

## 2021-08-06 ENCOUNTER — Encounter (HOSPITAL_COMMUNITY): Payer: Self-pay | Admitting: Internal Medicine

## 2021-08-06 ENCOUNTER — Ambulatory Visit (HOSPITAL_COMMUNITY): Payer: Medicare Other | Admitting: Anesthesiology

## 2021-08-06 ENCOUNTER — Ambulatory Visit (HOSPITAL_COMMUNITY)
Admission: RE | Admit: 2021-08-06 | Discharge: 2021-08-06 | Disposition: A | Discharge status: Home / Self Care | Payer: Medicare Other | Attending: Internal Medicine | Admitting: Internal Medicine

## 2021-08-06 ENCOUNTER — Ambulatory Visit (HOSPITAL_BASED_OUTPATIENT_CLINIC_OR_DEPARTMENT_OTHER): Payer: Medicare Other | Admitting: Anesthesiology

## 2021-08-06 DIAGNOSIS — Z86010 Personal history of colonic polyps: Secondary | ICD-10-CM

## 2021-08-06 DIAGNOSIS — Z7984 Long term (current) use of oral hypoglycemic drugs: Secondary | ICD-10-CM | POA: Diagnosis not present

## 2021-08-06 DIAGNOSIS — N182 Chronic kidney disease, stage 2 (mild): Secondary | ICD-10-CM

## 2021-08-06 DIAGNOSIS — D123 Benign neoplasm of transverse colon: Secondary | ICD-10-CM | POA: Insufficient documentation

## 2021-08-06 DIAGNOSIS — Z6841 Body Mass Index (BMI) 40.0 and over, adult: Secondary | ICD-10-CM | POA: Diagnosis not present

## 2021-08-06 DIAGNOSIS — E119 Type 2 diabetes mellitus without complications: Secondary | ICD-10-CM | POA: Insufficient documentation

## 2021-08-06 DIAGNOSIS — K635 Polyp of colon: Secondary | ICD-10-CM

## 2021-08-06 DIAGNOSIS — E1122 Type 2 diabetes mellitus with diabetic chronic kidney disease: Secondary | ICD-10-CM

## 2021-08-06 DIAGNOSIS — K5730 Diverticulosis of large intestine without perforation or abscess without bleeding: Secondary | ICD-10-CM

## 2021-08-06 DIAGNOSIS — I10 Essential (primary) hypertension: Secondary | ICD-10-CM | POA: Insufficient documentation

## 2021-08-06 DIAGNOSIS — Z79899 Other long term (current) drug therapy: Secondary | ICD-10-CM | POA: Diagnosis not present

## 2021-08-06 DIAGNOSIS — Z87891 Personal history of nicotine dependence: Secondary | ICD-10-CM | POA: Insufficient documentation

## 2021-08-06 DIAGNOSIS — Z09 Encounter for follow-up examination after completed treatment for conditions other than malignant neoplasm: Secondary | ICD-10-CM | POA: Diagnosis not present

## 2021-08-06 DIAGNOSIS — E1165 Type 2 diabetes mellitus with hyperglycemia: Secondary | ICD-10-CM | POA: Diagnosis not present

## 2021-08-06 DIAGNOSIS — Z1211 Encounter for screening for malignant neoplasm of colon: Secondary | ICD-10-CM | POA: Diagnosis not present

## 2021-08-06 HISTORY — PX: COLONOSCOPY WITH PROPOFOL: SHX5780

## 2021-08-06 LAB — GLUCOSE, CAPILLARY: Glucose-Capillary: 171 mg/dL — ABNORMAL HIGH (ref 70–99)

## 2021-08-06 LAB — HM COLONOSCOPY

## 2021-08-06 SURGERY — COLONOSCOPY WITH PROPOFOL
Anesthesia: General

## 2021-08-06 MED ORDER — LACTATED RINGERS IV SOLN: INTRAVENOUS | Status: DC

## 2021-08-06 MED ORDER — LIDOCAINE HCL (CARDIAC) PF 100 MG/5ML IV SOSY
PREFILLED_SYRINGE | INTRAVENOUS | Status: DC | PRN
  Administered 2021-08-06: 50 mg via INTRAVENOUS

## 2021-08-06 MED ORDER — PROPOFOL 10 MG/ML IV BOLUS
INTRAVENOUS | Status: DC | PRN
  Administered 2021-08-06: 100 mg via INTRAVENOUS

## 2021-08-06 MED ORDER — PROPOFOL 500 MG/50ML IV EMUL
INTRAVENOUS | Status: DC | PRN
  Administered 2021-08-06: 125 ug/kg/min via INTRAVENOUS

## 2021-08-06 NOTE — Op Note (Signed)
[facility] ?Patient Name: [name] [name] ?Procedure Date: 08/06/2021 1:02 PM ?MRN: [id number] ?Date of Birth: [birth year] ?Attending MD: Hildred Laser , MD ?CSN: [id number] ?Age: 71 ?Admit Type: Outpatient ?Procedure:                Colonoscopy ?Indications:              High risk colon cancer surveillance: Personal  ?                          history of colonic polyps ?Providers:                Hildred Laser, MD, Lurline Del, RN, Wynonia Musty  ?                          Tech, Technician ?Referring MD:             Renee Rival, FNP ?Medicines:                Propofol per Anesthesia ?Complications:            No immediate complications. ?Estimated Blood Loss:     Estimated blood loss was minimal. ?Procedure:                Pre-Anesthesia Assessment: ?                          - Prior to the procedure, a History and Physical  ?                          was performed, and patient medications and  ?                          allergies were reviewed. The patient's tolerance of  ?                          previous anesthesia was also reviewed. The risks  ?                          and benefits of the procedure and the sedation  ?                          options and risks were discussed with the patient.  ?                          All questions were answered, and informed consent  ?                          was obtained. Prior Anticoagulants: The patient has  ?                          taken no previous anticoagulant or antiplatelet  ?                          agents. ASA Grade Assessment: III - A patient with  ?  severe systemic disease. After reviewing the risks  ?                          and benefits, the patient was deemed in  ?                          satisfactory condition to undergo the procedure. ?                          After obtaining informed consent, the colonoscope  ?                          was passed under direct vision. Throughout the  ?                           procedure, the patient's blood pressure, pulse, and  ?                          oxygen saturations were monitored continuously. The  ?                          PCF-HQ190L ([id number]) scope was introduced through  ?                          the anus and advanced to the the cecum, identified  ?                          by appendiceal orifice and ileocecal valve. The  ?                          colonoscopy was performed without difficulty. The  ?                          patient tolerated the procedure well. The quality  ?                          of the bowel preparation was good. The ileocecal  ?                          valve, appendiceal orifice, and rectum were  ?                          photographed. ?Scope In: 1:13:54 PM ?Scope Out: 1:33:43 PM ?Scope Withdrawal Time: 0 hours 14 minutes 4 seconds  ?Total Procedure Duration: 0 hours 19 minutes 49 seconds  ?Findings: ?     The perianal and digital rectal examinations were normal. ?     A small polyp was found in the splenic flexure. Biopsies were taken with  ?     a cold forceps for histology. The pathology specimen was placed into  ?     Bottle Number 1. ?     A 5 mm polyp was found in the splenic flexure. The polyp was removed  ?     with a cold snare. Resection and retrieval were complete. The pathology  ?  specimen was placed into Bottle Number 1. ?     Scattered diverticula were found in the sigmoid colon and hepatic  ?     flexure. ?     The retroflexed view of the distal rectum and anal verge was normal and  ?     showed no anal or rectal abnormalities. ?Impression:               - One small polyp at the splenic flexure. Biopsied. ?                          - One 5 mm polyp at the splenic flexure, removed  ?                          with a cold snare. Resected and retrieved. ?                          - Diverticulosis in the sigmoid colon and at the  ?                          hepatic flexure. ?Moderate Sedation: ?     Per Anesthesia Care ?Recommendation:            - Patient has a contact number available for  ?                          emergencies. The signs and symptoms of potential  ?                          delayed complications were discussed with the  ?                          patient. Return to normal activities tomorrow.  ?                          Written discharge instructions were provided to the  ?                          patient. ?                          - High fiber diet and diabetic (ADA) diet today. ?                          - Continue present medications. ?                          - No aspirin, ibuprofen, naproxen, or other  ?                          non-steroidal anti-inflammatory drugs for 1 day. ?                          - Await pathology results. ?                          - Repeat colonoscopy is  recommended. The  ?                          colonoscopy date will be determined after pathology  ?                          results from today's exam become available for  ?                          review. ?Procedure Code(s):        --- Professional --- ?                          [id number], Colonoscopy, flexible; with removal of  ?                          tumor(s), polyp(s), or other lesion(s) by snare  ?                          technique ?                          45380, 59, Colonoscopy, flexible; with biopsy,  ?                          single or multiple ?Diagnosis Code(s):        --- Professional --- ?                          Z86.010, Personal history of colonic polyps ?                          K63.5, Polyp of colon ?                          K57.30, Diverticulosis of large intestine without  ?                          perforation or abscess without bleeding ?CPT copyright 2019 [employer] Association. All rights reserved. ?The codes documented in this report are preliminary and upon coder review may  ?be revised to meet current compliance requirements. ?Hildred Laser, MD ?Hildred Laser, MD ?08/06/2021 1:41:54 PM ?This report has been  signed electronically. ?Number of Addenda: 0 ?

## 2021-08-06 NOTE — Discharge Instructions (Signed)
No aspirin or NSAIDs for 24 hours. ?Resume usual medications as before ?Modified carb high-fiber diet. ?No driving for 24 hours. ?Physician will call with biopsy results. ?

## 2021-08-06 NOTE — Transfer of Care (Signed)
Immediate Anesthesia Transfer of Care Note ? ?Patient: [name] [name] ? ?Procedure(s) Performed: COLONOSCOPY WITH PROPOFOL ? ?Patient Location: Short Stay ? ?Anesthesia Type:General ? ?Level of Consciousness: awake and alert  ? ?Airway & Oxygen Therapy: Patient Spontanous Breathing ? ?Post-op Assessment: Report given to RN and Post -op Vital signs reviewed and stable ? ?Post vital signs: Reviewed and stable ? ?Last Vitals:  ?Vitals Value Taken Time  ?BP    ?Temp    ?Pulse    ?Resp    ?SpO2    ? ? ?Last Pain:  ?Vitals:  ? 08/06/21 1114  ?TempSrc: Oral  ?PainSc: 0-No pain  ?   ? ?Patients Stated Pain Goal: 6 (08/06/21 1114) ? ?Complications: No notable events documented. ?

## 2021-08-06 NOTE — Anesthesia Preprocedure Evaluation (Signed)
Anesthesia Evaluation  ?Patient identified by MRN, date of birth, ID band ?Patient awake ? ? ? ?Reviewed: ?Allergy & Precautions, NPO status , Patient's Chart, lab work & pertinent test results ? ?Airway ?Mallampati: II ? ?TM Distance: >3 FB ?Neck ROM: Full ? ? ? Dental ? ?(+) Dental Advisory Given, Missing ?  ?Pulmonary ?former smoker,  ?  ?Pulmonary exam normal ?breath sounds clear to auscultation ? ? ? ? ? ? Cardiovascular ?Exercise Tolerance: Good ?hypertension, Pt. on medications ?Normal cardiovascular exam ?Rhythm:Regular Rate:Normal ? ? ?  ?Neuro/Psych ? Neuromuscular disease negative psych ROS  ? GI/Hepatic ?negative GI ROS, Neg liver ROS,   ?Endo/Other  ?diabetes, Poorly Controlled, Type 2, Oral Hypoglycemic AgentsMorbid obesity ? Renal/GU ?Renal InsufficiencyRenal disease  ?negative genitourinary ?  ?Musculoskeletal ?negative musculoskeletal ROS ?(+)  ? Abdominal ?  ?Peds ?negative pediatric ROS ?(+)  Hematology ?negative hematology ROS ?(+)   ?Anesthesia Other Findings ? ? Reproductive/Obstetrics ?negative OB ROS ? ?  ? ? ? ? ? ? ? ? ? ? ? ? ? ?  ?  ? ? ? ? ? ? ? ? ?Anesthesia Physical ?Anesthesia Plan ? ?ASA: 3 ? ?Anesthesia Plan: General  ? ?Post-op Pain Management: Minimal or no pain anticipated  ? ?Induction: Intravenous ? ?PONV Risk Score and Plan: Propofol infusion ? ?Airway Management Planned: Nasal Cannula and Natural Airway ? ?Additional Equipment:  ? ?Intra-op Plan:  ? ?Post-operative Plan:  ? ?Informed Consent: I have reviewed the patients History and Physical, chart, labs and discussed the procedure including the risks, benefits and alternatives for the proposed anesthesia with the patient or authorized representative who has indicated his/her understanding and acceptance.  ? ? ? ?Dental advisory given ? ?Plan Discussed with: CRNA and Surgeon ? ?Anesthesia Plan Comments:   ? ? ? ? ? ? ?Anesthesia Quick Evaluation ? ?

## 2021-08-06 NOTE — H&P (Signed)
[name] [name] is an 71 y.o. male.   ?Chief Complaint: Patient is here for colonoscopy. ?HPI: Patient is 70 year old African-American male who has history of colonic adenomas and is here for surveillance colonoscopy.  His last colonoscopy was in March 2016 with removal of 1 small tubular adenoma.  He was therefore advised to wait 7 years.  He did have colonic adenomas on his very first colonoscopy but not on his second exam in February 2011(he had 2 polyps removed and they were non-adenomatous).  He denies abdominal pain change in bowel habits or rectal bleeding.  His appetite is good and his weight has been stable.  He is retired but he stays busy. ?He does not take aspirin or anticoagulants. ?Family history is negative for colon cancer. ? ?Past Medical History:  ?Diagnosis Date  ? Diabetes mellitus   ? Hyperlipidemia   ? Hypertension   ? Renal cyst 04/28/2011  ? Left hemorrhagic hilar cyst  ? ? ?Past Surgical History:  ?Procedure Laterality Date  ? COLONOSCOPY N/A 06/28/2014  ? Procedure: COLONOSCOPY;  Surgeon: Rogene Houston, MD;  Location: [facility];  Service: Endoscopy;  Laterality: N/A;  1030  ? NASAL SINUS SURGERY    ? ? ?Family History  ?Problem Relation Age of Onset  ? Cancer Mother   ? Diabetes Sister   ? Hypertension Sister   ? Diabetes Sister   ? Cancer Daughter   ? ?Social History:  reports that he has quit smoking. He has been exposed to tobacco smoke. He has never used smokeless tobacco. He reports that he does not drink alcohol and does not use drugs. ? ?Allergies:  ?Allergies  ?Allergen Reactions  ? Aspirin   ?  Nose Bleeds   ? Lipitor [Atorvastatin] Other (See Comments)  ?  Nose bleeds  ? Codeine Palpitations  ?  'heart beats real fast'  ? ? ?Medications Prior to Admission  ?Medication Sig Dispense Refill  ? Calcium Carb-Cholecalciferol (CALCIUM 500/D PO) Take 1 tablet by mouth in the morning.    ? Cholecalciferol (VITAMIN D3) 1000 units CAPS Take 1,000 Units by mouth in the morning.    ? Cinnamon  500 MG capsule Take 500 mg by mouth in the morning.    ? glipiZIDE (GLUCOTROL XL) 10 MG 24 hr tablet Take 1 tablet (10 mg total) by mouth daily with breakfast. 90 tablet 3  ? lisinopril-hydrochlorothiazide (ZESTORETIC) 20-25 MG tablet Take 1 tablet by mouth daily. 90 tablet 3  ? Multiple Vitamin (MULTIVITAMIN WITH MINERALS) TABS tablet Take 1 tablet by mouth daily.    ? Omega-3 Fatty Acids (FISH OIL) 1000 MG CAPS Take 1 capsule (1,000 mg total) by mouth daily. 90 capsule 1  ? polyethylene glycol-electrolytes (TRILYTE) 420 g solution Take 4,000 mLs by mouth as directed. 4000 mL 0  ? SitaGLIPtin-MetFORMIN HCl (JANUMET XR) [id number] MG TB24 Take 1 tablet by mouth daily. 90 tablet 1  ? vitamin B-12 (CYANOCOBALAMIN) 1000 MCG tablet Take 1,000 mcg by mouth in the morning.    ? ? ?Results for orders placed or performed during the hospital encounter of 08/06/21 (from the past 48 hour(s))  ?Glucose, capillary     Status: Abnormal  ? Collection Time: 08/06/21 11:13 AM  ?Result Value Ref Range  ? Glucose-Capillary 171 (H) 70 - 99 mg/dL  ?  Comment: Glucose reference range applies only to samples taken after fasting for at least 8 hours.  ? ?No results found. ? ?Review of Systems ? ?Blood pressure 132/89, pulse  79, temperature 97.9 ?F (36.6 ?C), temperature source Oral, resp. rate 18, SpO2 98 %. ?Physical Exam ?HENT:  ?   Mouth/Throat:  ?   Mouth: Mucous membranes are moist.  ?   Pharynx: Oropharynx is clear.  ?Eyes:  ?   General: No scleral icterus. ?   Conjunctiva/sclera: Conjunctivae normal.  ?Cardiovascular:  ?   Rate and Rhythm: Normal rate and regular rhythm.  ?   Heart sounds: Normal heart sounds. No murmur heard. ?Pulmonary:  ?   Effort: Pulmonary effort is normal.  ?   Breath sounds: Normal breath sounds.  ?Abdominal:  ?   General: There is no distension.  ?   Palpations: Abdomen is soft. There is no mass.  ?   Tenderness: There is no abdominal tenderness.  ?Musculoskeletal:     ?   General: No swelling.  ?   Cervical  back: Neck supple.  ?Lymphadenopathy:  ?   Cervical: No cervical adenopathy.  ?Skin: ?   General: Skin is warm and dry.  ?Neurological:  ?   Mental Status: He is alert.  ?  ? ?Assessment/Plan ? ?History of colonic adenomas. ?Surveillance colonoscopy. ? ?Hildred Laser, MD ?08/06/2021, 12:39 PM ? ? ? ?

## 2021-08-06 NOTE — Anesthesia Postprocedure Evaluation (Signed)
Anesthesia Post Note ? ?Patient: Jaykob Minichiello ? ?Procedure(s) Performed: COLONOSCOPY WITH PROPOFOL ? ?Patient location during evaluation: Phase II ?Anesthesia Type: General ?Level of consciousness: awake and alert and oriented ?Pain management: pain level controlled ?Vital Signs Assessment: post-procedure vital signs reviewed and stable ?Respiratory status: respiratory function stable ?Cardiovascular status: blood pressure returned to baseline and stable ?Postop Assessment: no apparent nausea or vomiting ?Anesthetic complications: no ? ? ?No notable events documented. ? ? ?Last Vitals:  ?Vitals:  ? 08/06/21 1340 08/06/21 1343  ?BP: (!) 88/60 101/70  ?Pulse: 98   ?Resp: 18   ?Temp: 36.6 ?C   ?SpO2: 94%   ?  ?Last Pain:  ?Vitals:  ? 08/06/21 1340  ?TempSrc: Oral  ?PainSc: 0-No pain  ? ? ?  ?  ?  ?  ?  ?  ? ?Shaya Altamura C [place] ? ? ? ? ?

## 2021-08-07 LAB — SURGICAL PATHOLOGY

## 2021-08-08 ENCOUNTER — Encounter (HOSPITAL_COMMUNITY): Payer: Self-pay | Admitting: Internal Medicine

## 2021-08-11 ENCOUNTER — Encounter (INDEPENDENT_AMBULATORY_CARE_PROVIDER_SITE_OTHER): Payer: Self-pay | Admitting: *Deleted

## 2021-08-29 DIAGNOSIS — I129 Hypertensive chronic kidney disease with stage 1 through stage 4 chronic kidney disease, or unspecified chronic kidney disease: Secondary | ICD-10-CM | POA: Diagnosis not present

## 2021-08-29 DIAGNOSIS — N182 Chronic kidney disease, stage 2 (mild): Secondary | ICD-10-CM | POA: Diagnosis not present

## 2021-08-29 DIAGNOSIS — E1122 Type 2 diabetes mellitus with diabetic chronic kidney disease: Secondary | ICD-10-CM | POA: Diagnosis not present

## 2021-08-29 DIAGNOSIS — E7800 Pure hypercholesterolemia, unspecified: Secondary | ICD-10-CM | POA: Diagnosis not present

## 2021-08-29 DIAGNOSIS — I10 Essential (primary) hypertension: Secondary | ICD-10-CM | POA: Diagnosis not present

## 2021-08-30 LAB — CBC WITH DIFFERENTIAL/PLATELET
Basophils Absolute: 0 10*3/uL (ref 0.0–0.2)
Basos: 0 %
EOS (ABSOLUTE): 0.2 10*3/uL (ref 0.0–0.4)
Eos: 3 %
Hematocrit: 41.6 % (ref 37.5–51.0)
Hemoglobin: 13.7 g/dL (ref 13.0–17.7)
Immature Grans (Abs): 0 10*3/uL (ref 0.0–0.1)
Immature Granulocytes: 0 %
Lymphocytes Absolute: 2.4 10*3/uL (ref 0.7–3.1)
Lymphs: 49 %
MCH: 28.8 pg (ref 26.6–33.0)
MCHC: 32.9 g/dL (ref 31.5–35.7)
MCV: 88 fL (ref 79–97)
Monocytes Absolute: 0.6 10*3/uL (ref 0.1–0.9)
Monocytes: 11 %
Neutrophils Absolute: 1.9 10*3/uL (ref 1.4–7.0)
Neutrophils: 37 %
Platelets: 208 10*3/uL (ref 150–450)
RBC: 4.75 x10E6/uL (ref 4.14–5.80)
RDW: 14 % (ref 11.6–15.4)
WBC: 5 10*3/uL (ref 3.4–10.8)

## 2021-08-30 LAB — CMP14+EGFR
ALT: 40 IU/L (ref 0–44)
AST: 33 IU/L (ref 0–40)
Albumin/Globulin Ratio: 1.7 (ref 1.2–2.2)
Albumin: 4.4 g/dL (ref 3.7–4.7)
Alkaline Phosphatase: 57 IU/L (ref 44–121)
BUN/Creatinine Ratio: 10 (ref 10–24)
BUN: 14 mg/dL (ref 8–27)
Bilirubin Total: 0.9 mg/dL (ref 0.0–1.2)
CO2: 24 mmol/L (ref 20–29)
Calcium: 9.7 mg/dL (ref 8.6–10.2)
Chloride: 98 mmol/L (ref 96–106)
Creatinine, Ser: 1.34 mg/dL — ABNORMAL HIGH (ref 0.76–1.27)
Globulin, Total: 2.6 g/dL (ref 1.5–4.5)
Glucose: 203 mg/dL — ABNORMAL HIGH (ref 70–99)
Potassium: 4.5 mmol/L (ref 3.5–5.2)
Sodium: 138 mmol/L (ref 134–144)
Total Protein: 7 g/dL (ref 6.0–8.5)
eGFR: 57 mL/min/{1.73_m2} — ABNORMAL LOW (ref >59)

## 2021-08-30 LAB — LIPID PANEL
Chol/HDL Ratio: 4.8 ratio (ref 0.0–5.0)
Cholesterol, Total: 208 mg/dL — ABNORMAL HIGH (ref 100–199)
HDL: 43 mg/dL (ref >39)
LDL Chol Calc (NIH): 134 mg/dL — ABNORMAL HIGH (ref 0–99)
Triglycerides: 172 mg/dL — ABNORMAL HIGH (ref 0–149)
VLDL Cholesterol Cal: 31 mg/dL (ref 5–40)

## 2021-08-30 LAB — HEMOGLOBIN A1C
Est. average glucose Bld gHb Est-mCnc: 163 mg/dL
Hgb A1c MFr Bld: 7.3 % — ABNORMAL HIGH (ref 4.8–5.6)

## 2021-09-03 ENCOUNTER — Encounter: Payer: Self-pay | Admitting: Nurse Practitioner

## 2021-09-03 ENCOUNTER — Ambulatory Visit (INDEPENDENT_AMBULATORY_CARE_PROVIDER_SITE_OTHER): Payer: Medicare Other | Admitting: Nurse Practitioner

## 2021-09-03 VITALS — BP 134/84 | HR 87 | Ht 72.0 in | Wt 280.0 lb

## 2021-09-03 DIAGNOSIS — N182 Chronic kidney disease, stage 2 (mild): Secondary | ICD-10-CM | POA: Diagnosis not present

## 2021-09-03 DIAGNOSIS — I10 Essential (primary) hypertension: Secondary | ICD-10-CM | POA: Diagnosis not present

## 2021-09-03 DIAGNOSIS — Z139 Encounter for screening, unspecified: Secondary | ICD-10-CM

## 2021-09-03 DIAGNOSIS — E1122 Type 2 diabetes mellitus with diabetic chronic kidney disease: Secondary | ICD-10-CM | POA: Diagnosis not present

## 2021-09-03 DIAGNOSIS — I129 Hypertensive chronic kidney disease with stage 1 through stage 4 chronic kidney disease, or unspecified chronic kidney disease: Secondary | ICD-10-CM

## 2021-09-03 DIAGNOSIS — Z6838 Body mass index (BMI) 38.0-38.9, adult: Secondary | ICD-10-CM

## 2021-09-03 DIAGNOSIS — E7800 Pure hypercholesterolemia, unspecified: Secondary | ICD-10-CM

## 2021-09-03 NOTE — Progress Notes (Signed)
? [name] [name]     MRN: [id number]      DOB: Apr 15, 1951 ? ? ?HPI ?Mr. Aufiero with past medical history of type 2 diabetes with CKD stage II and hypertension, hyperlipidemia, obesity, is here for follow up and re-evaluation of chronic medical conditions, medication management and review of any available recent lab  ? ?The PT denies any adverse reactions to current medications since the last visit.  ?There are no new concerns.  ?There are no specific complaints  ? ?Refused all vaccine due to day, need for all vaccines discussed they verbalized understanding ? ? ? ? ? ? ?ROS ?Denies recent fever or chills. ?Denies sinus pressure, nasal congestion, ear pain or sore throat. ?Denies chest congestion, productive cough or wheezing. ?Denies chest pains, palpitations and leg swelling ?Denies abdominal pain, nausea, vomiting,diarrhea or constipation.   ?Denies dysuria, frequency, hesitancy or incontinence. ?Denies joint pain, swelling and limitation in mobility. ?Denies headaches, seizures, numbness, or tingling. ?Denies depression, anxiety or insomnia. ? ? ? ?PE ? ?BP 134/84 (BP Location: Right Arm, Patient Position: Sitting, Cuff Size: Large)   Pulse 87   Ht 6' (1.829 m)   Wt 280 lb (127 kg)   SpO2 97%   BMI 37.97 kg/m?  ? ?Patient alert and oriented and in no cardiopulmonary distress. ? ? ?Chest: Clear to auscultation bilaterally. ? ?CVS: S1, S2 no murmurs, no S3.Regular rate. ? ?ABD: Soft non tender.  ? ?Ext: No edema ? ?MS: Adequate ROM spine, shoulders, hips and knees. ? ?Psych: Good eye contact, normal affect. Memory intact not anxious or depressed appearing. ? ? ? ? ?Assessment & Plan ? ?Obesity ?Wt Readings from Last 3 Encounters:  ?09/03/21 280 lb (127 kg)  ?07/31/21 287 lb (130.2 kg)  ?07/04/21 285 lb (129.3 kg)  ?he has been cutting tress and grass.  ?Need to increase intake of whole foods consisting mainly vegetables and protein less carbohydrate drinking at least 64 ounces of water daily instead of juice,  engaging in daily physical exercise in as tolerated at least 150 minutes weekly, importance of portion control also discussed with patient today verbalized understanding. ? ?HYPERTENSION, BENIGN ?BP Readings from Last 3 Encounters:  ?09/03/21 134/84  ?08/06/21 101/70  ?07/31/21 139/89  ?Chronic condition well-controlled on lisinopril-hydrochlorothiazide 20-25 mg tablet once daily ?Continue current medication ?DASH diet advised engage in regular exercises at least 150 minutes weekly ?Follow-up in 4 months ? ?Type 2 DM with CKD stage 2 and hypertension ([place]) ?Lab Results  ?Component Value Date  ? HGBA1C 7.3 (H) 08/29/2021  ?Chronic condition uncontrolled goal is A1c less than 7 currently on glipizide 10 mg daily, Janumet Exar 100- 1000 mg tablet daily ?Has been checking BP at home a.m. blood sugar has been in the range of 140s to 150s nighttime blood sugar has been in the range 120s to 190s ?Denies hypoglycemic episode, patient refused changes to his medications today. ?Told to avoid juice, soda, cake, drink at least 64 ounces of water daily ?Urine creatinine labs ordered today ?Follow-up in 4 months ? ?CKD (chronic kidney disease), stage II ?Lab Results  ?Component Value Date  ? NA 138 08/29/2021  ? K 4.5 08/29/2021  ? CO2 24 08/29/2021  ? BUN 14 08/29/2021  ? CREATININE 1.34 (H) 08/29/2021  ? CALCIUM 9.7 08/29/2021  ? GLUCOSE 203 (H) 08/29/2021  ?Stable chronic condition ?Avoid NSAID and other nephrotoxic agents ?Drink at least 64 ounces of water daily, patient refused and is SGLT 2 ? ?Hyperlipidemia ?Lab Results  ?  Component Value Date  ? CHOL 208 (H) 08/29/2021  ? HDL 43 08/29/2021  ? LDLCALC 134 (H) 08/29/2021  ? LDLDIRECT 94 06/09/2011  ? TRIG 172 (H) 08/29/2021  ? CHOLHDL 4.8 08/29/2021  ?Chronic condition uncontrolled patient refused Zetia PCSK 9. ?Need to get LDL under control to reduce risk of CVA, MI discussed with patient. ?Avoid fried fatty foods engage in daily physical exercise at least 150 minutes daily   ?

## 2021-09-03 NOTE — Assessment & Plan Note (Signed)
BP Readings from Last 3 Encounters:  ?09/03/21 134/84  ?08/06/21 101/70  ?07/31/21 139/89  ?Chronic condition well-controlled on lisinopril-hydrochlorothiazide 20-25 mg tablet once daily ?Continue current medication ?DASH diet advised engage in regular exercises at least 150 minutes weekly ?Follow-up in 4 months ?

## 2021-09-03 NOTE — Assessment & Plan Note (Signed)
Lab Results  ?Component Value Date  ? HGBA1C 7.3 (H) 08/29/2021  ?Chronic condition uncontrolled goal is A1c less than 7 currently on glipizide 10 mg daily, Janumet Exar 100- 1000 mg tablet daily ?Has been checking BP at home a.m. blood sugar has been in the range of 140s to 150s nighttime blood sugar has been in the range 120s to 190s ?Denies hypoglycemic episode, patient refused changes to his medications today. ?Told to avoid juice, soda, cake, drink at least 64 ounces of water daily ?Urine creatinine labs ordered today ?Follow-up in 4 months ?

## 2021-09-03 NOTE — Assessment & Plan Note (Signed)
Lab Results  ?Component Value Date  ? CHOL 208 (H) 08/29/2021  ? HDL 43 08/29/2021  ? LDLCALC 134 (H) 08/29/2021  ? LDLDIRECT 94 06/09/2011  ? TRIG 172 (H) 08/29/2021  ? CHOLHDL 4.8 08/29/2021  ?Chronic condition uncontrolled patient refused Zetia PCSK 9. ?Need to get LDL under control to reduce risk of CVA, MI discussed with patient. ?Avoid fried fatty foods engage in daily physical exercise at least 150 minutes daily ?

## 2021-09-03 NOTE — Assessment & Plan Note (Signed)
Lab Results  ?Component Value Date  ? NA 138 08/29/2021  ? K 4.5 08/29/2021  ? CO2 24 08/29/2021  ? BUN 14 08/29/2021  ? CREATININE 1.34 (H) 08/29/2021  ? CALCIUM 9.7 08/29/2021  ? GLUCOSE 203 (H) 08/29/2021  ?Stable chronic condition ?Avoid NSAID and other nephrotoxic agents ?Drink at least 64 ounces of water daily, patient refused and is SGLT 2 ?

## 2021-09-03 NOTE — Assessment & Plan Note (Addendum)
Wt Readings from Last 3 Encounters:  ?09/03/21 280 lb (127 kg)  ?07/31/21 287 lb (130.2 kg)  ?07/04/21 285 lb (129.3 kg)  ?he has been cutting tress and grass.  ?Need to increase intake of whole foods consisting mainly vegetables and protein less carbohydrate drinking at least 64 ounces of water daily instead of juice, engaging in daily physical exercise in as tolerated at least 150 minutes weekly, importance of portion control also discussed with patient today verbalized understanding. ?

## 2021-09-03 NOTE — Patient Instructions (Signed)

## 2021-09-05 LAB — MICROALBUMIN / CREATININE URINE RATIO
Creatinine, Urine: 85.5 mg/dL
Microalb/Creat Ratio: 10 mg/g creat (ref 0–29)
Microalbumin, Urine: 8.8 ug/mL

## 2021-09-05 NOTE — Progress Notes (Signed)
Normal test. Continue current medications

## 2021-09-16 ENCOUNTER — Other Ambulatory Visit: Payer: Self-pay | Admitting: Nurse Practitioner

## 2021-09-16 DIAGNOSIS — E1122 Type 2 diabetes mellitus with diabetic chronic kidney disease: Secondary | ICD-10-CM

## 2021-09-17 NOTE — Telephone Encounter (Signed)
Pt appears to have PCP. Former pt of [facility]  Pending Prescriptions Disp Refills  . JANUMET XR [phone] MG TB24 [Pharmacy Med Name: Janumet XR [phone] MG Oral Tablet Extended Release 24 Hour] 90 tablet 3    Sig: TAKE 1 TABLET BY MOUTH  DAILY     Endocrinology:  Diabetes - Biguanide + DPP-4 Inhibitor Combos Failed - 09/16/2021 10:06 PM      Failed - Cr in normal range and within 360 days    Creat  Date Value Ref Range Status  01/08/2021 1.27 0.70 - 1.28 mg/dL Final   Creatinine, Ser  Date Value Ref Range Status  08/29/2021 1.34 (H) 0.76 - 1.27 mg/dL Final   Creatinine, Urine  Date Value Ref Range Status  06/26/2020 182 20 - 320 mg/dL Final         Failed - eGFR in normal range and within 360 days    GFR, Est African American  Date Value Ref Range Status  10/16/2020 71 > OR = 60 mL/min/1.60m Final   GFR, Est Non African American  Date Value Ref Range Status  10/16/2020 62 > OR = 60 mL/min/1.729mFinal   GFR, Estimated  Date Value Ref Range Status  07/31/2021 57 (L) >60 mL/min Final    Comment:    (NOTE) Calculated using the CKD-EPI Creatinine Equation (2021)    eGFR  Date Value Ref Range Status  08/29/2021 57 (L) >59 mL/min/1.73 Final         Failed - B12 Level in normal range and within 720 days    No results found for: VITAMINB12       Passed - HBA1C is between 0 and 7.9 and within 180 days    Hgb A1C (fingerstick)  Date Value Ref Range Status  07/10/2013 7.0 (H) <5.7 % Final    Comment:                                                                           According to the ADA Clinical Practice Recommendations for 2011, when HbA1c is used as a screening test:     >=6.5%   Diagnostic of Diabetes Mellitus            (if abnormal result is confirmed)   5.7-6.4%   Increased risk of developing Diabetes Mellitus   References:Diagnosis and Classification of Diabetes Mellitus,Diabetes Care,2011,34(Suppl 1):S62-S69 and Standards of  Medical Care in         Diabetes - 2011,Diabetes Care,2011,34 (Suppl 1):S11-S61.     Hgb A1c MFr Bld  Date Value Ref Range Status  08/29/2021 7.3 (H) 4.8 - 5.6 % Final    Comment:             Prediabetes: 5.7 - 6.4          Diabetes: >6.4          Glycemic control for adults with diabetes: <7.0          Passed - Valid encounter within last 6 months    Recent Outpatient Visits          5 months ago Type 2 DM [place] CKD stage 2 and [place]  [place]  [place], NP   8 months ago Type 2 DM with CKD stage 2 and hypertension ([place])   [place] Eulogio Bear, NP   1 year ago Type 2 DM with CKD stage 2 and hypertension ([place])   [place], Modena Nunnery, MD   1 year ago Routine general medical examination at a health care facility   [place], Modena Nunnery, MD   1 year ago Type 2 diabetes mellitus without complication, without long-term current use of insulin ([place])   [place], Modena Nunnery, MD      Future Appointments            In 3 months [place], Dewaine Conger, FNP [facility]           Passed - CBC within normal limits and completed in the last 12 months    WBC  Date Value Ref Range Status  08/29/2021 5.0 3.4 - 10.8 x10E3/uL Final  06/26/2020 4.8 3.8 - 10.8 Thousand/uL Final   RBC  Date Value Ref Range Status  08/29/2021 4.75 4.14 - 5.80 x10E6/uL Final  06/26/2020 4.76 4.20 - 5.80 Million/uL Final   Hemoglobin  Date Value Ref Range Status  08/29/2021 13.7 13.0 - 17.7 g/dL Final   Hematocrit  Date Value Ref Range Status  08/29/2021 41.6 37.5 - 51.0 % Final   MCHC  Date Value Ref Range Status  08/29/2021 32.9 31.5 - 35.7 g/dL Final  06/26/2020 33.5 32.0 - 36.0 g/dL Final   [facility]  Date Value Ref Range Status  08/29/2021 28.8 26.6 - 33.0 pg Final  06/26/2020 29.2 27.0 - 33.0 pg Final   MCV  Date Value Ref Range Status   08/29/2021 88 79 - 97 fL Final   No results found for: PLTCOUNTKUC, LABPLAT, POCPLA RDW  Date Value Ref Range Status  08/29/2021 14.0 11.6 - 15.4 % Final

## 2021-10-02 ENCOUNTER — Telehealth: Payer: Medicare Other

## 2021-10-30 ENCOUNTER — Other Ambulatory Visit: Payer: Self-pay

## 2021-10-30 DIAGNOSIS — E1122 Type 2 diabetes mellitus with diabetic chronic kidney disease: Secondary | ICD-10-CM

## 2021-10-30 MED ORDER — JANUMET XR 100-1000 MG PO TB24: 1.0000 | ORAL_TABLET | Freq: Every day | ORAL | 1 refills | Status: DC

## 2021-11-25 DIAGNOSIS — E113293 Type 2 diabetes mellitus with mild nonproliferative diabetic retinopathy without macular edema, bilateral: Secondary | ICD-10-CM | POA: Diagnosis not present

## 2022-01-02 DIAGNOSIS — I129 Hypertensive chronic kidney disease with stage 1 through stage 4 chronic kidney disease, or unspecified chronic kidney disease: Secondary | ICD-10-CM | POA: Diagnosis not present

## 2022-01-02 DIAGNOSIS — E785 Hyperlipidemia, unspecified: Secondary | ICD-10-CM | POA: Diagnosis not present

## 2022-01-02 DIAGNOSIS — Z139 Encounter for screening, unspecified: Secondary | ICD-10-CM | POA: Diagnosis not present

## 2022-01-02 DIAGNOSIS — N182 Chronic kidney disease, stage 2 (mild): Secondary | ICD-10-CM | POA: Diagnosis not present

## 2022-01-02 DIAGNOSIS — E7800 Pure hypercholesterolemia, unspecified: Secondary | ICD-10-CM | POA: Diagnosis not present

## 2022-01-02 DIAGNOSIS — E1122 Type 2 diabetes mellitus with diabetic chronic kidney disease: Secondary | ICD-10-CM | POA: Diagnosis not present

## 2022-01-02 DIAGNOSIS — E559 Vitamin D deficiency, unspecified: Secondary | ICD-10-CM | POA: Diagnosis not present

## 2022-01-03 LAB — TSH: TSH: 1.39 u[IU]/mL (ref 0.450–4.500)

## 2022-01-03 LAB — CMP14+EGFR
ALT: 33 IU/L (ref 0–44)
AST: 27 IU/L (ref 0–40)
Albumin/Globulin Ratio: 1.8 (ref 1.2–2.2)
Albumin: 4.4 g/dL (ref 3.8–4.8)
Alkaline Phosphatase: 65 IU/L (ref 44–121)
BUN/Creatinine Ratio: 10 (ref 10–24)
BUN: 12 mg/dL (ref 8–27)
Bilirubin Total: 0.6 mg/dL (ref 0.0–1.2)
CO2: 24 mmol/L (ref 20–29)
Calcium: 9.7 mg/dL (ref 8.6–10.2)
Chloride: 100 mmol/L (ref 96–106)
Creatinine, Ser: 1.24 mg/dL (ref 0.76–1.27)
Globulin, Total: 2.4 g/dL (ref 1.5–4.5)
Glucose: 205 mg/dL — ABNORMAL HIGH (ref 70–99)
Potassium: 4.2 mmol/L (ref 3.5–5.2)
Sodium: 140 mmol/L (ref 134–144)
Total Protein: 6.8 g/dL (ref 6.0–8.5)
eGFR: 62 mL/min/{1.73_m2} (ref >59)

## 2022-01-03 LAB — LIPID PANEL
Chol/HDL Ratio: 5.1 ratio — ABNORMAL HIGH (ref 0.0–5.0)
Cholesterol, Total: 226 mg/dL — ABNORMAL HIGH (ref 100–199)
HDL: 44 mg/dL (ref >39)
LDL Chol Calc (NIH): 145 mg/dL — ABNORMAL HIGH (ref 0–99)
Triglycerides: 203 mg/dL — ABNORMAL HIGH (ref 0–149)
VLDL Cholesterol Cal: 37 mg/dL (ref 5–40)

## 2022-01-03 LAB — CBC WITH DIFFERENTIAL/PLATELET
Basophils Absolute: 0 10*3/uL (ref 0.0–0.2)
Basos: 1 %
EOS (ABSOLUTE): 0.2 10*3/uL (ref 0.0–0.4)
Eos: 4 %
Hematocrit: 42.9 % (ref 37.5–51.0)
Hemoglobin: 14 g/dL (ref 13.0–17.7)
Immature Grans (Abs): 0 10*3/uL (ref 0.0–0.1)
Immature Granulocytes: 0 %
Lymphocytes Absolute: 2.5 10*3/uL (ref 0.7–3.1)
Lymphs: 42 %
MCH: 28.6 pg (ref 26.6–33.0)
MCHC: 32.6 g/dL (ref 31.5–35.7)
MCV: 88 fL (ref 79–97)
Monocytes Absolute: 0.6 10*3/uL (ref 0.1–0.9)
Monocytes: 10 %
Neutrophils Absolute: 2.5 10*3/uL (ref 1.4–7.0)
Neutrophils: 43 %
Platelets: 196 10*3/uL (ref 150–450)
RBC: 4.9 x10E6/uL (ref 4.14–5.80)
RDW: 14.6 % (ref 11.6–15.4)
WBC: 5.8 10*3/uL (ref 3.4–10.8)

## 2022-01-03 LAB — HEMOGLOBIN A1C
Est. average glucose Bld gHb Est-mCnc: 186 mg/dL
Hgb A1c MFr Bld: 8.1 % — ABNORMAL HIGH (ref 4.8–5.6)

## 2022-01-03 LAB — VITAMIN D 25 HYDROXY (VIT D DEFICIENCY, FRACTURES): Vit D, 25-Hydroxy: 18.8 ng/mL — ABNORMAL LOW (ref 30.0–100.0)

## 2022-01-03 LAB — PSA: Prostate Specific Ag, Serum: 0.9 ng/mL (ref 0.0–4.0)

## 2022-01-05 NOTE — Progress Notes (Signed)
Will review labs at his upcoming appointment

## 2022-01-06 ENCOUNTER — Ambulatory Visit (INDEPENDENT_AMBULATORY_CARE_PROVIDER_SITE_OTHER): Payer: Medicare Other | Admitting: Nurse Practitioner

## 2022-01-06 ENCOUNTER — Encounter: Payer: Self-pay | Admitting: Nurse Practitioner

## 2022-01-06 VITALS — BP 120/78 | HR 85 | Ht 71.0 in | Wt 292.0 lb

## 2022-01-06 DIAGNOSIS — E559 Vitamin D deficiency, unspecified: Secondary | ICD-10-CM

## 2022-01-06 DIAGNOSIS — I129 Hypertensive chronic kidney disease with stage 1 through stage 4 chronic kidney disease, or unspecified chronic kidney disease: Secondary | ICD-10-CM | POA: Diagnosis not present

## 2022-01-06 DIAGNOSIS — Z0000 Encounter for general adult medical examination without abnormal findings: Secondary | ICD-10-CM | POA: Insufficient documentation

## 2022-01-06 DIAGNOSIS — Z2821 Immunization not carried out because of patient refusal: Secondary | ICD-10-CM

## 2022-01-06 DIAGNOSIS — E1122 Type 2 diabetes mellitus with diabetic chronic kidney disease: Secondary | ICD-10-CM | POA: Diagnosis not present

## 2022-01-06 DIAGNOSIS — N182 Chronic kidney disease, stage 2 (mild): Secondary | ICD-10-CM | POA: Diagnosis not present

## 2022-01-06 DIAGNOSIS — I10 Essential (primary) hypertension: Secondary | ICD-10-CM

## 2022-01-06 DIAGNOSIS — E782 Mixed hyperlipidemia: Secondary | ICD-10-CM | POA: Diagnosis not present

## 2022-01-06 DIAGNOSIS — Z0001 Encounter for general adult medical examination with abnormal findings: Secondary | ICD-10-CM | POA: Diagnosis not present

## 2022-01-06 MED ORDER — GLIPIZIDE ER 5 MG PO TB24: ORAL_TABLET | ORAL | 0 refills | Status: DC

## 2022-01-06 MED ORDER — JANUMET XR 100-1000 MG PO TB24: 1.0000 | ORAL_TABLET | Freq: Every day | ORAL | 1 refills | Status: DC

## 2022-01-06 NOTE — Assessment & Plan Note (Signed)
Last vitamin D Lab Results  Component Value Date   VD25OH 18.8 (L) 01/02/2022  Currently on vitamin D supplement but does not know the dosage, patient plans to double the dose he is taking currently

## 2022-01-06 NOTE — Assessment & Plan Note (Signed)
Annual exam as documented.  Counseling done include healthy lifestyle involving committing to 150 minutes of exercise per week, heart healthy diet, and attaining healthy weight. The importance of adequate sleep also discussed.  Regular use of seat belt and home safety were also discussed . Changes in health habits are decided on by patient with goals and time frames set for achieving them. Immunization and cancer screening  needs are specifically addressed at this visit.  Patient declined all vaccines due

## 2022-01-06 NOTE — Patient Instructions (Addendum)
Please take glipizide '15mg'$  daily Goal for fasting blood sugar ranges from 80 to 120 and 2 hours after any meal or at bedtime should be between 130 to 170.    It is important that you exercise regularly at least 30 minutes 5 times a week.  Think about what you will eat, plan ahead. Choose " clean, green, fresh or frozen" over canned, processed or packaged foods which are more sugary, salty and fatty. 70 to 75% of food eaten should be vegetables and fruit. Three meals at set times with snacks allowed between meals, but they must be fruit or vegetables. Aim to eat over a 12 hour period , example 7 am to 7 pm, and STOP after  your last meal of the day. Drink water,generally about 64 ounces per day, no other drink is as healthy. Fruit juice is best enjoyed in a healthy way, by EATING the fruit.  Thanks for choosing [facility], we consider it a privelige to serve you.

## 2022-01-06 NOTE — Assessment & Plan Note (Signed)
BP Readings from Last 3 Encounters:  01/06/22 120/78  09/03/21 134/84  08/06/21 101/70  Chronic medical condition well-controlled on lisinopril-hydrochlorothiazide 20-25 mg 1 tablet daily Continue current medication DASH diet advised engage in regular moderate exercises at least 150 minutes weekly Follow-up in 4 months

## 2022-01-06 NOTE — Assessment & Plan Note (Signed)
Wt Readings from Last 3 Encounters:  01/06/22 292 lb (132.5 kg)  09/03/21 280 lb (127 kg)  07/31/21 287 lb (130.2 kg)  Patient counseled on low-carb diet Patient encouraged to engage in regular moderate exercises at least 150 minutes weekly

## 2022-01-06 NOTE — Assessment & Plan Note (Signed)
Lab Results  Component Value Date   HGBA1C 8.1 (H) 01/02/2022  Chronic uncontrolled condition Currently on glipizide 10 mg daily, Janumet Exar [phone] mg daily Start glipizide 15 mg daily continue current dose of Janumet Avoid sugar sweets soda or juice Goal for fasting blood sugar ranges from 80 to 120 and 2 hours after any meal or at bedtime should be between 130 to 170. Up-to-date with diabetic eye exam Need for Tdap vaccine discussed patient refused vaccine Follow-up in 4 months

## 2022-01-06 NOTE — Assessment & Plan Note (Signed)
Lab Results  Component Value Date   CHOL 226 (H) 01/02/2022   HDL 44 01/02/2022   LDLCALC 145 (H) 01/02/2022   LDLDIRECT 94 06/09/2011   TRIG 203 (H) 01/02/2022   CHOLHDL 5.1 (H) 01/02/2022  LDL went up from 1 34-1 45. LDL goal is less than 70, need to control LDL due to risk of CVA, MI discussed with patient, Again Patient refused medications for hyperlipidemia today Avoid fried fatty foods engage in regular daily moderate exercises at least 150 minutes weekly Follow-up in 4 months

## 2022-01-06 NOTE — Progress Notes (Signed)
Complete physical exam  Patient: Maxamillian Tienda   DOB: 1951-03-01   71 y.o. Male  MRN: [id number]  Subjective:    Chief Complaint  Patient presents with   Annual Exam    cpe    Quavion Boule is a 71 y.o. male with past medical history of type 2 diabetes, hypertension, hyperlipidemia, obesity, who presents today for a complete physical exam. He reports consuming a low fat and low sodium diet. He generally feels well. He states that he has an active lifestyle.     Type II diabetes. Reports fasting CBG mostly in the 100's-120's , sometimes in the 180's denies currently on glipizide 10 mg daily, Janumet xr [phone] mg daily. Had diabetic eye eaxm done on 8/1, reports no retinopathy.  Sometimes drinks orange juice in the morning.  Patient denies hypoglycemic episodes  Due for Tdap vaccine and pneumonia vaccine shingles vaccine patient declined all vaccines need for vaccines discussed.    Most recent fall risk assessment:    01/06/2022    8:20 AM  Fall Risk   Falls in the past year? 0  Number falls in past yr: 0  Injury with Fall? 0  Risk for fall due to : No Fall Risks  Follow up Falls evaluation completed     Most recent depression screenings:    01/06/2022    8:20 AM 09/03/2021    8:34 AM  PHQ 2/9 Scores  PHQ - 2 Score 0 0        Patient Care Team: Johnette Abraham, MD as PCP - General (Internal Medicine) Edythe Clarity, [facility] as Pharmacist (Pharmacist)   Outpatient Medications Prior to Visit  Medication Sig   Calcium Carb-Cholecalciferol (CALCIUM 500/D PO) Take 1 tablet by mouth in the morning.   Cholecalciferol (VITAMIN D3) 1000 units CAPS Take 1,000 Units by mouth in the morning.   Cinnamon 500 MG capsule Take 500 mg by mouth in the morning.   glipiZIDE (GLUCOTROL XL) 10 MG 24 hr tablet Take 1 tablet (10 mg total) by mouth daily with breakfast.   lisinopril-hydrochlorothiazide (ZESTORETIC) 20-25 MG tablet Take 1 tablet by mouth daily.   Multiple Vitamin  (MULTIVITAMIN WITH MINERALS) TABS tablet Take 1 tablet by mouth daily.   Omega-3 Fatty Acids (FISH OIL) 1000 MG CAPS Take 1 capsule (1,000 mg total) by mouth daily.   TURMERIC PO Take 1,500 mg by mouth. Once a day   vitamin B-12 (CYANOCOBALAMIN) 1000 MCG tablet Take 1,000 mcg by mouth in the morning.   [DISCONTINUED] SitaGLIPtin-MetFORMIN HCl (JANUMET XR) [phone] MG TB24 Take 1 tablet by mouth daily.   No facility-administered medications prior to visit.    Review of Systems  Constitutional: Negative.   HENT: Negative.    Eyes: Negative.   Respiratory: Negative.    Cardiovascular: Negative.   Gastrointestinal: Negative.   Genitourinary: Negative.   Musculoskeletal: Negative.   Skin: Negative.   Neurological: Negative.   Endo/Heme/Allergies: Negative.   Psychiatric/Behavioral: Negative.            Objective:     BP 120/78 (BP Location: Left Arm, Cuff Size: Large)   Pulse 85   Ht '5\' 11"'$  (1.803 m)   Wt 292 lb (132.5 kg)   SpO2 93%   BMI 40.73 kg/m    Physical Exam Constitutional:      General: He is not in acute distress.    Appearance: He is obese. He is not ill-appearing, toxic-appearing or diaphoretic.  HENT:  Head: Atraumatic.     Right Ear: Tympanic membrane, ear canal and external ear normal. There is no impacted cerumen.     Left Ear: Tympanic membrane, ear canal and external ear normal. There is no impacted cerumen.     Nose: No congestion or rhinorrhea.     Mouth/Throat:     Mouth: Mucous membranes are moist.     Pharynx: Oropharynx is clear. No oropharyngeal exudate or posterior oropharyngeal erythema.  Eyes:     General: No scleral icterus.       Right eye: No discharge.        Left eye: No discharge.     Extraocular Movements: Extraocular movements intact.     Conjunctiva/sclera: Conjunctivae normal.     Pupils: Pupils are equal, round, and reactive to light.  Neck:     Vascular: No carotid bruit.  Cardiovascular:     Rate and Rhythm: Normal  rate and regular rhythm.     Pulses: Normal pulses.     Heart sounds: Normal heart sounds. No murmur heard.    No friction rub. No gallop.  Pulmonary:     Effort: Pulmonary effort is normal. No respiratory distress.     Breath sounds: Normal breath sounds. No stridor. No wheezing, rhonchi or rales.  Chest:     Chest wall: No tenderness.  Abdominal:     General: There is no distension.     Palpations: Abdomen is soft. There is no mass.     Tenderness: There is no abdominal tenderness. There is no right CVA tenderness, left CVA tenderness, guarding or rebound.     Hernia: No hernia is present.  Musculoskeletal:        General: No swelling, tenderness, deformity or signs of injury.     Cervical back: Normal range of motion and neck supple. No rigidity or tenderness.     Right lower leg: No edema.     Left lower leg: No edema.  Lymphadenopathy:     Cervical: No cervical adenopathy.  Skin:    General: Skin is warm and dry.     Capillary Refill: Capillary refill takes less than 2 seconds.     Coloration: Skin is not jaundiced or pale.     Findings: No bruising, erythema, lesion or rash.  Neurological:     Mental Status: He is alert and oriented to person, place, and time.     Cranial Nerves: No cranial nerve deficit.     Sensory: No sensory deficit.     Motor: No weakness.     Coordination: Coordination normal.     Gait: Gait normal.  Psychiatric:        Mood and Affect: Mood normal.        Behavior: Behavior normal.        Thought Content: Thought content normal.        Judgment: Judgment normal.      No results found for any visits on 01/06/22.     Assessment & Plan:    Routine Health Maintenance and Physical Exam  Immunization History  Administered Date(s) Administered   Moderna Covid-19 Vaccine Bivalent Booster 52yr & up 06/26/2021   Moderna SARS-COV2 Booster Vaccination 12/19/2019   Moderna Sars-Covid-2 Vaccination 11/23/2019, 11/23/2019, 12/19/2019, 05/28/2020,  05/28/2020    Health Maintenance  Topic Date Due   OPHTHALMOLOGY EXAM  10/31/2021   COVID-19 Vaccine (7 - Moderna series) 01/24/2022 (Originally 10/26/2021)   Hepatitis C Screening  04/27/2048 (Originally 10/24/1968)   HEMOGLOBIN A1C  07/03/2022   FOOT EXAM  07/05/2022   COLONOSCOPY (Pts 45-82yr Insurance coverage will need to be confirmed)  08/06/2028   HPV VACCINES  Aged Out   Pneumonia Vaccine 72 Years old  Discontinued   INFLUENZA VACCINE  Discontinued   TETANUS/TDAP  Discontinued   Zoster Vaccines- Shingrix  Discontinued    Discussed health benefits of physical activity, and encouraged him to engage in regular exercise appropriate for his age and condition.  Problem List Items Addressed This Visit       Cardiovascular and Mediastinum   Type 2 DM with CKD stage 2 and hypertension (HCC)    Lab Results  Component Value Date   HGBA1C 8.1 (H) 01/02/2022  Chronic uncontrolled condition Currently on glipizide 10 mg daily, Janumet Exar [phone] mg daily Start glipizide 15 mg daily continue current dose of Janumet Avoid sugar sweets soda or juice Goal for fasting blood sugar ranges from 80 to 120 and 2 hours after any meal or at bedtime should be between 130 to 170. Up-to-date with diabetic eye exam Need for Tdap vaccine discussed patient refused vaccine Follow-up in 4 months      Relevant Medications   glipiZIDE (GLIPIZIDE XL) 5 MG 24 hr tablet   SitaGLIPtin-MetFORMIN HCl (JANUMET XR) [phone] MG TB24   HYPERTENSION, BENIGN    BP Readings from Last 3 Encounters:  01/06/22 120/78  09/03/21 134/84  08/06/21 101/70  Chronic medical condition well-controlled on lisinopril-hydrochlorothiazide 20-25 mg 1 tablet daily Continue current medication DASH diet advised engage in regular moderate exercises at least 150 minutes weekly Follow-up in 4 months        Other   Hyperlipidemia    Lab Results  Component Value Date   CHOL 226 (H) 01/02/2022   HDL 44 01/02/2022   LDLCALC  145 (H) 01/02/2022   LDLDIRECT 94 06/09/2011   TRIG 203 (H) 01/02/2022   CHOLHDL 5.1 (H) 01/02/2022  LDL went up from 1 34-1 45. LDL goal is less than 70, need to control LDL due to risk of CVA, MI discussed with patient, Again Patient refused medications for hyperlipidemia today Avoid fried fatty foods engage in regular daily moderate exercises at least 150 minutes weekly Follow-up in 4 months      Obesity    Wt Readings from Last 3 Encounters:  01/06/22 292 lb (132.5 kg)  09/03/21 280 lb (127 kg)  07/31/21 287 lb (130.2 kg)  Patient counseled on low-carb diet Patient encouraged to engage in regular moderate exercises at least 150 minutes weekly      Relevant Medications   glipiZIDE (GLIPIZIDE XL) 5 MG 24 hr tablet   SitaGLIPtin-MetFORMIN HCl (JANUMET XR) [phone] MG TB24   Refused influenza vaccine   Vitamin D deficiency    Last vitamin D Lab Results  Component Value Date   VD25OH 18.8 (L) 01/02/2022  Currently on vitamin D supplement but does not know the dosage, patient plans to double the dose he is taking currently       Annual physical exam - Primary    Annual exam as documented.  Counseling done include healthy lifestyle involving committing to 150 minutes of exercise per week, heart healthy diet, and attaining healthy weight. The importance of adequate sleep also discussed.  Regular use of seat belt and home safety were also discussed . Changes in health habits are decided on by patient with goals and time frames set for achieving them. Immunization and cancer screening  needs are specifically addressed at this visit.  Patient declined all vaccines due       Return in about 4 months (around 05/08/2022) for HTN/HLD/DM.     Renee Rival, FNP

## 2022-01-09 ENCOUNTER — Other Ambulatory Visit: Payer: Self-pay | Admitting: Nurse Practitioner

## 2022-01-13 ENCOUNTER — Other Ambulatory Visit: Payer: Self-pay

## 2022-01-13 DIAGNOSIS — I129 Hypertensive chronic kidney disease with stage 1 through stage 4 chronic kidney disease, or unspecified chronic kidney disease: Secondary | ICD-10-CM

## 2022-01-13 MED ORDER — GLIPIZIDE ER 5 MG PO TB24: ORAL_TABLET | ORAL | 0 refills | Status: DC

## 2022-01-13 MED ORDER — LISINOPRIL-HYDROCHLOROTHIAZIDE 20-25 MG PO TABS: 1.0000 | ORAL_TABLET | Freq: Every day | ORAL | 3 refills | Status: DC

## 2022-01-13 MED ORDER — GLIPIZIDE ER 10 MG PO TB24: 10.0000 mg | ORAL_TABLET | Freq: Every day | ORAL | 3 refills | Status: DC

## 2022-01-13 MED ORDER — JANUMET XR 100-1000 MG PO TB24: 1.0000 | ORAL_TABLET | Freq: Every day | ORAL | 1 refills | Status: DC

## 2022-01-30 ENCOUNTER — Other Ambulatory Visit: Payer: Self-pay | Admitting: Nurse Practitioner

## 2022-02-02 NOTE — Telephone Encounter (Signed)
Requested Prescriptions  Pending Prescriptions Disp Refills  . lisinopril-hydrochlorothiazide (ZESTORETIC) 20-25 MG tablet [Pharmacy Med Name: Lisinopril-hydroCHLOROthiazide 20-25 MG Oral Tablet] 100 tablet 2    Sig: TAKE 1 TABLET BY MOUTH DAILY     Cardiovascular:  ACEI + Diuretic Combos Failed - 01/30/2022 10:19 PM      Failed - Valid encounter within last 6 months    Recent Outpatient Visits          9 months ago Type 2 DM with CKD stage 2 and hypertension ([place])   [place] Eulogio Bear, NP   1 year ago Type 2 DM with CKD stage 2 and hypertension ([place])   [place] Eulogio Bear, NP   1 year ago Type 2 DM with CKD stage 2 and hypertension ([place])   [place], Modena Nunnery, MD   1 year ago Routine general medical examination at a health care facility   [place], Modena Nunnery, MD   2 years ago Type 2 diabetes mellitus without complication, without long-term current use of insulin ([place])   [place], Modena Nunnery, MD      Future Appointments            In 3 months Doren Custard, Hazle Nordmann, MD [facility]           Passed - Na in normal range and within 180 days    Sodium  Date Value Ref Range Status  01/02/2022 140 134 - 144 mmol/L Final         Passed - K in normal range and within 180 days    Potassium  Date Value Ref Range Status  01/02/2022 4.2 3.5 - 5.2 mmol/L Final         Passed - Cr in normal range and within 180 days    Creat  Date Value Ref Range Status  01/08/2021 1.27 0.70 - 1.28 mg/dL Final   Creatinine, Ser  Date Value Ref Range Status  01/02/2022 1.24 0.76 - 1.27 mg/dL Final   Creatinine, Urine  Date Value Ref Range Status  06/26/2020 182 20 - 320 mg/dL Final         Passed - eGFR is 30 or above and within 180 days    GFR, Est African American  Date Value Ref Range Status  10/16/2020 71 > OR = 60 mL/min/1.73m Final    GFR, Est Non African American  Date Value Ref Range Status  10/16/2020 62 > OR = 60 mL/min/1.739mFinal   GFR, Estimated  Date Value Ref Range Status  07/31/2021 57 (L) >60 mL/min Final    Comment:    (NOTE) Calculated using the CKD-EPI Creatinine Equation (2021)    eGFR  Date Value Ref Range Status  01/02/2022 62 >59 mL/min/1.73 Final         Passed - Patient is not pregnant      Passed - Last BP in normal range    BP Readings from Last 1 Encounters:  01/06/22 120/78

## 2022-03-15 ENCOUNTER — Other Ambulatory Visit: Payer: Self-pay | Admitting: Nurse Practitioner

## 2022-03-26 ENCOUNTER — Other Ambulatory Visit: Payer: Self-pay | Admitting: Nurse Practitioner

## 2022-03-26 DIAGNOSIS — I129 Hypertensive chronic kidney disease with stage 1 through stage 4 chronic kidney disease, or unspecified chronic kidney disease: Secondary | ICD-10-CM

## 2022-04-06 ENCOUNTER — Telehealth: Payer: Self-pay | Admitting: Internal Medicine

## 2022-04-06 ENCOUNTER — Other Ambulatory Visit: Payer: Self-pay | Admitting: Nurse Practitioner

## 2022-04-06 ENCOUNTER — Other Ambulatory Visit: Payer: Self-pay

## 2022-04-06 DIAGNOSIS — E1122 Type 2 diabetes mellitus with diabetic chronic kidney disease: Secondary | ICD-10-CM

## 2022-04-06 MED ORDER — JANUMET XR 100-1000 MG PO TB24: 1.0000 | ORAL_TABLET | Freq: Every day | ORAL | 2 refills | Status: DC

## 2022-04-06 NOTE — Telephone Encounter (Signed)
Prescription Request  04/06/2022  Is this a "Controlled Substance" medicine? No  LOV: Visit date not found  What is the name of the medication or equipment? JANUMET XR [phone] MG TB24 [[id number]]   Have you contacted your pharmacy to request a refill? No   Which pharmacy would you like this sent to?  [facility]   Patient notified that their request is being sent to the clinical staff for review and that they should receive a response within 2 business days.   Please advise at Mobile There is no such number on file (mobile).

## 2022-04-06 NOTE — Telephone Encounter (Signed)
Refills sent

## 2022-04-09 ENCOUNTER — Other Ambulatory Visit: Payer: Self-pay

## 2022-04-09 MED ORDER — LISINOPRIL-HYDROCHLOROTHIAZIDE 20-25 MG PO TABS: 1.0000 | ORAL_TABLET | Freq: Every day | ORAL | 3 refills | Status: DC

## 2022-04-28 ENCOUNTER — Encounter: Payer: Medicare Other | Admitting: Internal Medicine

## 2022-05-11 ENCOUNTER — Encounter: Payer: Self-pay | Admitting: Internal Medicine

## 2022-05-11 ENCOUNTER — Ambulatory Visit (INDEPENDENT_AMBULATORY_CARE_PROVIDER_SITE_OTHER): Payer: Medicare Other | Admitting: Internal Medicine

## 2022-05-11 VITALS — BP 146/83 | HR 91 | Ht 74.0 in | Wt 290.8 lb

## 2022-05-11 VITALS — BP 146/83 | HR 91 | Resp 16 | Ht 74.0 in | Wt 290.0 lb

## 2022-05-11 DIAGNOSIS — N182 Chronic kidney disease, stage 2 (mild): Secondary | ICD-10-CM

## 2022-05-11 DIAGNOSIS — I129 Hypertensive chronic kidney disease with stage 1 through stage 4 chronic kidney disease, or unspecified chronic kidney disease: Secondary | ICD-10-CM | POA: Diagnosis not present

## 2022-05-11 DIAGNOSIS — E1122 Type 2 diabetes mellitus with diabetic chronic kidney disease: Secondary | ICD-10-CM

## 2022-05-11 DIAGNOSIS — E559 Vitamin D deficiency, unspecified: Secondary | ICD-10-CM | POA: Diagnosis not present

## 2022-05-11 DIAGNOSIS — I10 Essential (primary) hypertension: Secondary | ICD-10-CM | POA: Diagnosis not present

## 2022-05-11 DIAGNOSIS — E782 Mixed hyperlipidemia: Secondary | ICD-10-CM | POA: Diagnosis not present

## 2022-05-11 DIAGNOSIS — Z0000 Encounter for general adult medical examination without abnormal findings: Secondary | ICD-10-CM

## 2022-05-11 NOTE — Assessment & Plan Note (Addendum)
He is currently prescribed lisinopril-HCTZ 20-25 mg daily for treatment of hypertension.  His blood pressure today 150/81 initially and 146/83 on repeat.  Checks his blood pressure daily at home.  Systolic readings are consistently 110-120 mmHg. -HTN is well-controlled based on home readings.  No medication changes are indicated today.

## 2022-05-11 NOTE — Assessment & Plan Note (Signed)
A1c 8.1 in September.  Glipizide was increased to 50 mg daily at that time.  He is additionally prescribed Janumet XR [phone] mg daily.  He checks his blood sugar daily and reports readings ranging 70-180.  He endorse regular events of symptoms of hypoglycemia when his blood sugar decreases below 100.  He will eat better at this time and agree.  His blood sugar and increases to 160-170. -Repeat A1c ordered today given previous medication adjustments -I expressed my concern to [name] [name] today over his labile blood sugar readings and frequent episodes of symptomatic hypoglycemia.  He is pleased with his progress and has refused to make adjustments at this time.  He is in agreement with making medication adjustments if his A1c has increased, but for now declines to make any medication changes.

## 2022-05-11 NOTE — Progress Notes (Signed)
Established Patient Office Visit  Subjective   Patient ID: [name] [name], male    DOB: 1950-09-01  Age: 72 y.o. MRN: [id number]  Chief Complaint  Patient presents with   Hypertension    Follow up   Diabetes    Follow up   Hyperlipidemia    Follow up   [name] [name] returns to care today.  He was last seen at [facility] on 01/06/22 by [name] Rua, NP for his annual exam.  At that time glipizide was increased to 15 mg daily for improved diabetes control.  83-monthfollow-up was arranged.  There have been no acute interval events. Today Mr. MLeclairestates that he feels well. He is asymptomatic and is pleased with his progress in improving control of his chronic medical conditions.   Past Medical History:  Diagnosis Date   Diabetes mellitus    Hyperlipidemia    Hypertension    Renal cyst 04/28/2011   Left hemorrhagic hilar cyst   Past Surgical History:  Procedure Laterality Date   COLONOSCOPY N/A 06/28/2014   Procedure: COLONOSCOPY;  Surgeon: NRogene Houston MD;  [facility];  Service: Endoscopy;  Laterality: N/A;  1030   COLONOSCOPY WITH PROPOFOL N/A 08/06/2021   Procedure: COLONOSCOPY WITH PROPOFOL;  Surgeon: RRogene Houston MD;  [facility];  Service: Endoscopy;  Laterality: N/A;  1255   NASAL SINUS SURGERY     Social History   Tobacco Use   Smoking status: Former    Passive exposure: Past   Smokeless tobacco: Never   Tobacco comments:    Quit 40 years ago   Vaping Use   Vaping Use: Never used  Substance Use Topics   Alcohol use: No    Alcohol/week: 0.0 standard drinks of alcohol   Drug use: No   Family History  Problem Relation Age of Onset   Cancer Mother    Diabetes Sister    Hypertension Sister    Diabetes Sister    Cancer Daughter    Allergies  Allergen Reactions   Aspirin     Nose Bleeds    Lipitor [Atorvastatin] Other (See Comments)    Nose bleeds   Codeine Palpitations    'heart beats real fast'   Review of Systems   Constitutional:  Negative for chills and fever.  HENT:  Negative for sore throat.   Respiratory:  Negative for cough and shortness of breath.   Cardiovascular:  Negative for chest pain, palpitations and leg swelling.  Gastrointestinal:  Negative for abdominal pain, blood in stool, constipation, diarrhea, nausea and vomiting.  Genitourinary:  Negative for dysuria and hematuria.  Musculoskeletal:  Negative for myalgias.  Skin:  Negative for itching and rash.  Neurological:  Negative for dizziness and headaches.  Psychiatric/Behavioral:  Negative for depression and suicidal ideas.      Objective:     BP (!) 146/83   Pulse 91   Ht '6\' 2"'$  (1.88 m)   Wt 290 lb 12.8 oz (131.9 kg)   SpO2 96%   BMI 37.34 kg/m  BP Readings from Last 3 Encounters:  05/11/22 (!) 146/83  05/11/22 (!) 146/83  01/06/22 120/78   Physical Exam Vitals reviewed.  Constitutional:      General: He is not in acute distress.    Appearance: Normal appearance. He is obese. He is not ill-appearing.  HENT:     Head: Normocephalic and atraumatic.     Right Ear: External ear normal.     Left Ear:  External ear normal.     Nose: Nose normal. No congestion or rhinorrhea.     Mouth/Throat:     Mouth: Mucous membranes are moist.     Pharynx: Oropharynx is clear.  Eyes:     General: No scleral icterus.    Extraocular Movements: Extraocular movements intact.     Conjunctiva/sclera: Conjunctivae normal.     Pupils: Pupils are equal, round, and reactive to light.  Cardiovascular:     Rate and Rhythm: Normal rate and regular rhythm.     Pulses: Normal pulses.     Heart sounds: Normal heart sounds. No murmur heard. Pulmonary:     Effort: Pulmonary effort is normal.     Breath sounds: Normal breath sounds. No wheezing, rhonchi or rales.  Abdominal:     General: Abdomen is flat. Bowel sounds are normal. There is no distension.     Palpations: Abdomen is soft.     Tenderness: There is no abdominal tenderness.   Musculoskeletal:        General: No swelling or deformity. Normal range of motion.     Cervical back: Normal range of motion.  Skin:    General: Skin is warm and dry.     Capillary Refill: Capillary refill takes less than 2 seconds.  Neurological:     General: No focal deficit present.     Mental Status: He is alert and oriented to person, place, and time.     Motor: No weakness.  Psychiatric:        Mood and Affect: Mood normal.        Behavior: Behavior normal.        Thought Content: Thought content normal.   Last CBC Lab Results  Component Value Date   WBC 5.8 01/02/2022   HGB 14.0 01/02/2022   HCT 42.9 01/02/2022   MCV 88 01/02/2022   MCH 28.6 01/02/2022   RDW 14.6 01/02/2022   PLT 196 50/12/3816   Last metabolic panel Lab Results  Component Value Date   GLUCOSE 205 (H) 01/02/2022   NA 140 01/02/2022   K 4.2 01/02/2022   CL 100 01/02/2022   CO2 24 01/02/2022   BUN 12 01/02/2022   CREATININE 1.24 01/02/2022   EGFR 62 01/02/2022   CALCIUM 9.7 01/02/2022   PROT 6.8 01/02/2022   ALBUMIN 4.4 01/02/2022   LABGLOB 2.4 01/02/2022   AGRATIO 1.8 01/02/2022   BILITOT 0.6 01/02/2022   ALKPHOS 65 01/02/2022   AST 27 01/02/2022   ALT 33 01/02/2022   ANIONGAP 13 07/31/2021   Last lipids Lab Results  Component Value Date   CHOL 226 (H) 01/02/2022   HDL 44 01/02/2022   LDLCALC 145 (H) 01/02/2022   LDLDIRECT 94 06/09/2011   TRIG 203 (H) 01/02/2022   CHOLHDL 5.1 (H) 01/02/2022   Last hemoglobin A1c Lab Results  Component Value Date   HGBA1C 8.1 (H) 01/02/2022   Last thyroid functions Lab Results  Component Value Date   TSH 1.390 01/02/2022   Last vitamin D Lab Results  Component Value Date   VD25OH 18.8 (L) 01/02/2022   The 10-year ASCVD risk score (Arnett DK, et al., 2019) is: 45%    Assessment & Plan:   Problem List Items Addressed This Visit       Type 2 DM with CKD stage 2 and hypertension ([place]) - Primary    A1c 8.1 in September.  Glipizide  was increased to 50 mg daily at that time.  He is additionally  prescribed Janumet XR [phone] mg daily.  He checks his blood sugar daily and reports readings ranging 70-180.  He endorse regular events of symptoms of hypoglycemia when his blood sugar decreases below 100.  He will eat better at this time and agree.  His blood sugar and increases to 160-170. -Repeat A1c ordered today given previous medication adjustments -I expressed my concern to Mr. Dalporto today over his labile blood sugar readings and frequent episodes of symptomatic hypoglycemia.  He is pleased with his progress and has refused to make adjustments at this time.  He is in agreement with making medication adjustments if his A1c has increased, but for now declines to make any medication changes.      HYPERTENSION, BENIGN    He is currently prescribed lisinopril-HCTZ 20-25 mg daily for treatment of hypertension.  His blood pressure today 150/81 initially and 146/83 on repeat.  Checks his blood pressure daily at home.  Systolic readings are consistently 110-120 mmHg. -HTN is well-controlled based on home readings.  No medication changes are indicated today.      Hyperlipidemia    Lipid panel last updated in September.  Total cholesterol 226 and LDL 145.  His 10-year ASCVD risk score today is 45%.  He has previously statin therapy. -I reviewed with Mr. Lightsey the indications for statin therapy (DM, LDL above goal, significantly elevated ASCVD risk) over he remains opposed to statin therapy.  I also reviewed statin alternatives, such as Repatha.  He is currently opposed to any cholesterol-lowering medication aside from the fish oil that he currently takes.  He expressed understanding of the indications for treatment but remains opposed to taking any additional medication for hyperlipidemia at this time.      Vitamin D deficiency    Vitamin D level 18.8 in September.  He is currently taking daily vitamin D supplementation, 2000  units. -Repeat vitamin D level ordered today       Return in about 4 months (around 09/09/2022).    Johnette Abraham, MD

## 2022-05-11 NOTE — Progress Notes (Signed)
Subjective:   [name] [name] is a 72 y.o. male who presents for Medicare Annual/Subsequent preventive examination.  Review of Systems    Review of Systems  All other systems reviewed and are negative.   Objective:    Today's Vitals   05/11/22 0844  BP: (!) 146/83  Pulse: 91  Resp: 16  SpO2: 96%  Weight: 290 lb (131.5 kg)  Height: '6\' 2"'$  (1.88 m)   Body mass index is 37.23 kg/m.     05/11/2022    8:51 AM 08/06/2021   11:00 AM 07/31/2021   10:51 AM 05/19/2021    2:41 AM 12/24/2017    9:10 AM 06/08/2016    1:38 PM 05/15/2016   11:12 PM  Advanced Directives  Does Patient Have a Medical Advance Directive? No No No No No No No  Would patient like information on creating a medical advance directive? Yes (Inpatient - patient defers creating a medical advance directive at this time - Information given) No - Patient declined No - Patient declined No - Patient declined No - Patient declined No - Patient declined     Current Medications (verified) Outpatient Encounter Medications as of 05/11/2022  Medication Sig   Calcium Carb-Cholecalciferol (CALCIUM 500/D PO) Take 1 tablet by mouth in the morning.   Cholecalciferol (VITAMIN D3) 1000 units CAPS Take 1,000 Units by mouth in the morning.   Cinnamon 500 MG capsule Take 500 mg by mouth in the morning.   Ginkgo Biloba 120 MG CAPS Take 1 capsule by mouth daily.   glipiZIDE (GLUCOTROL XL) 10 MG 24 hr tablet Take 1 tablet (10 mg total) by mouth daily with breakfast.   glipiZIDE (GLUCOTROL XL) 5 MG 24 hr tablet TAKE 1 TABLET BY MOUTH DAILY  WITH GLIPIZIDE XL '10MG'$  TO MAKE  TOTAL OF GLIPIZIDE XL '15MG'$  DAILY   lisinopril-hydrochlorothiazide (ZESTORETIC) 20-25 MG tablet Take 1 tablet by mouth daily.   Multiple Vitamin (MULTIVITAMIN WITH MINERALS) TABS tablet Take 1 tablet by mouth daily.   Omega-3 Fatty Acids (FISH OIL) 1000 MG CAPS Take 1 capsule (1,000 mg total) by mouth daily.   SitaGLIPtin-MetFORMIN HCl (JANUMET XR) [phone] MG TB24 Take 1 tablet  by mouth daily.   TURMERIC PO Take 1,500 mg by mouth. Once a day   vitamin B-12 (CYANOCOBALAMIN) 1000 MCG tablet Take 1,000 mcg by mouth in the morning.   No facility-administered encounter medications on file as of 05/11/2022.    Allergies (verified) Aspirin, Lipitor [atorvastatin], and Codeine   History: Past Medical History:  Diagnosis Date   Diabetes mellitus    Hyperlipidemia    Hypertension    Renal cyst 04/28/2011   Left hemorrhagic hilar cyst   Past Surgical History:  Procedure Laterality Date   COLONOSCOPY N/A 06/28/2014   Procedure: COLONOSCOPY;  Surgeon: Rogene Houston, MD;  [facility];  Service: Endoscopy;  Laterality: N/A;  1030   COLONOSCOPY WITH PROPOFOL N/A 08/06/2021   Procedure: COLONOSCOPY WITH PROPOFOL;  Surgeon: Rogene Houston, MD;  [facility];  Service: Endoscopy;  Laterality: N/A;  1255   NASAL SINUS SURGERY     Family History  Problem Relation Age of Onset   Cancer Mother    Diabetes Sister    Hypertension Sister    Diabetes Sister    Cancer Daughter    Social History   Socioeconomic History   Marital status: Married    Spouse name: Not on file   Number of children: Not on file  Years of education: Not on file   Highest education level: Not on file  Occupational History   Not on file  Tobacco Use   Smoking status: Former    Passive exposure: Past   Smokeless tobacco: Never   Tobacco comments:    Quit 40 years ago   Vaping Use   Vaping Use: Never used  Substance and Sexual Activity   Alcohol use: No    Alcohol/week: 0.0 standard drinks of alcohol   Drug use: No   Sexual activity: Yes  Other Topics Concern   Not on file  Social History Narrative   Married for 45 years.Lives with wife.Retired.   Social Determinants of Health   Financial Resource Strain: Not on file  Food Insecurity: Not on file  Transportation Needs: Not on file  Physical Activity: Not on file  Stress: Not on file  Social Connections:  Not on file    Tobacco Counseling Counseling given: Not Answered Tobacco comments: Quit 40 years ago    Clinical Intake:  Pre-visit preparation completed: Yes  Pain : No/denies pain     Nutritional Status: BMI > 30  Obese Diabetes: Yes CBG done?: No Did pt. bring in CBG monitor from home?: Yes (Controlled at home based on reading <140) Glucose Meter Downloaded?: No  How often do you need to have someone help you when you read instructions, pamphlets, or other written materials from your doctor or pharmacy?: 1 - Never What is the last grade level you completed in school?: 12th grade  Diabetic? Yes    Activities of Daily Living    05/11/2022    8:53 AM 07/31/2021   10:53 AM  In your present state of health, do you have any difficulty performing the following activities:  Hearing? 0   Vision? 0   Difficulty concentrating or making decisions? 0   Walking or climbing stairs? 0   Dressing or bathing? 0   Doing errands, shopping? 0 0    Patient Care Team: Johnette Abraham, MD as PCP - General (Internal Medicine) Edythe Clarity, [facility] as Pharmacist (Pharmacist)  Indicate any recent Medical Services you may have received from other than [facility] providers in the past year (date may be approximate).     Assessment:   This is a routine wellness examination for [name] [name].  Hearing/Vision screen No results found.  Dietary issues and exercise activities discussed:     Goals Addressed   None    Depression Screen    05/11/2022    8:53 AM 05/11/2022    8:15 AM 01/06/2022    8:20 AM 09/03/2021    8:34 AM 07/04/2021    1:27 PM 01/08/2021    8:18 AM 07/18/2020    2:11 PM  PHQ 2/9 Scores  PHQ - 2 Score 0 0 0 0 0 0 0  PHQ- 9 Score  0     0    Fall Risk    05/11/2022    8:53 AM 05/11/2022    8:15 AM 01/06/2022    8:20 AM 09/03/2021    8:34 AM 07/04/2021    1:27 PM  [place] in the past year? 0 0 0 0 0  Number falls in past yr: 0 0 0 0 0  Injury with Fall? 0 0 0 0 0   Risk for fall due to :  No Fall Risks No Fall Risks No Fall Risks   Follow up  Falls evaluation completed Falls evaluation completed Falls  evaluation completed     FALL RISK PREVENTION PERTAINING TO THE HOME:  Any stairs in or around the home? Yes  If so, are there any without handrails? Yes  Home free of loose throw rugs in walkways, pet beds, electrical cords, etc? Yes  Adequate lighting in your home to reduce risk of falls? Yes   ASSISTIVE DEVICES UTILIZED TO PREVENT FALLS:  Life alert? No  Use of a cane, walker or w/c? No  Grab bars in the bathroom? Yes  Shower chair or bench in shower? Yes  Elevated toilet seat or a handicapped toilet? Yes    Cognitive Function:        05/11/2022    8:53 AM  6CIT Screen  What Year? 0 points  What month? 0 points  What time? 0 points  Count back from 20 0 points  Months in reverse 4 points  Repeat phrase 10 points  Total Score 14 points    Immunizations Immunization History  Administered Date(s) Administered   Moderna Covid-19 Vaccine Bivalent Booster 42yr & up 06/26/2021   Moderna SARS-COV2 Booster Vaccination 12/19/2019   Moderna Sars-Covid-2 Vaccination 11/23/2019, 11/23/2019, 12/19/2019, 05/28/2020, 05/28/2020    TDAP status: Due, Education has been provided regarding the importance of this vaccine. Advised may receive this vaccine at local pharmacy or Health Dept. Aware to provide a copy of the vaccination record if obtained from local pharmacy or Health Dept. Verbalized acceptance and understanding.  Flu Vaccine status: Declined, Education has been provided regarding the importance of this vaccine but patient still declined. Advised may receive this vaccine at local pharmacy or Health Dept. Aware to provide a copy of the vaccination record if obtained from local pharmacy or Health Dept. Verbalized acceptance and understanding.  Pneumococcal vaccine status: Declined,  Education has been provided regarding the importance of  this vaccine but patient still declined. Advised may receive this vaccine at local pharmacy or Health Dept. Aware to provide a copy of the vaccination record if obtained from local pharmacy or Health Dept. Verbalized acceptance and understanding.   Covid-19 vaccine status: Declined, Education has been provided regarding the importance of this vaccine but patient still declined. Advised may receive this vaccine at local pharmacy or Health Dept.or vaccine clinic. Aware to provide a copy of the vaccination record if obtained from local pharmacy or Health Dept. Verbalized acceptance and understanding.  Qualifies for Shingles Vaccine? Yes   Zostavax completed No   Shingrix Completed?: No.    Education has been provided regarding the importance of this vaccine. Patient has been advised to call insurance company to determine out of pocket expense if they have not yet received this vaccine. Advised may also receive vaccine at local pharmacy or Health Dept. Verbalized acceptance and understanding.  Screening Tests Health Maintenance  Topic Date Due   DTaP/Tdap/Td (1 - Tdap) Never done   Medicare Annual Wellness (AWV)  02/26/2021   OPHTHALMOLOGY EXAM  10/31/2021   COVID-19 Vaccine (8 - 2023-24 season) 12/26/2021   Hepatitis C Screening  04/27/2048 (Originally 10/24/1968)   HEMOGLOBIN A1C  07/03/2022   FOOT EXAM  07/05/2022   Diabetic kidney evaluation - Urine ACR  09/04/2022   Diabetic kidney evaluation - eGFR measurement  01/03/2023   COLONOSCOPY (Pts 45-474yrInsurance coverage will need to be confirmed)  08/06/2028   HPV VACCINES  Aged Out   Pneumonia Vaccine 6576Years old  Discontinued   INFLUENZA VACCINE  Discontinued   Zoster Vaccines- [place]  Maintenance  Health Maintenance Due  Topic Date Due   DTaP/Tdap/Td (1 - Tdap) Never done   Medicare Annual Wellness (AWV)  02/26/2021   OPHTHALMOLOGY EXAM  10/31/2021   COVID-19 Vaccine (8 - 2023-24 season) 12/26/2021     Colorectal cancer screening: Type of screening: Colonoscopy. Completed 08/06/2021. Repeat every 7 years  Lung Cancer Screening: (Low Dose CT Chest recommended if Age 68-80 years, 30 pack-year currently smoking OR have quit w/in 15years.) does not qualify.   Additional Screening:  Hepatitis C Screening: does qualify; Postponed   Vision Screening: Recommended annual ophthalmology exams for early detection of glaucoma and other disorders of the eye. Is the patient up to date with their annual eye exam?  Yes  Who is the provider or what is the name of the office in which the patient attends annual eye exams? [facility] If pt is not established with a provider, would they like to be referred to a provider to establish care? No .   Dental Screening: Recommended annual dental exams for proper oral hygiene  Community Resource Referral / Chronic Care Management: CRR required this visit?  No   CCM required this visit?  No      Plan:     I have personally reviewed and noted the following in the patient's chart:   Medical and social history Use of alcohol, tobacco or illicit drugs  Current medications and supplements including opioid prescriptions. Patient is not currently taking opioid prescriptions. Functional ability and status Nutritional status Physical activity Advanced directives List of other physicians Hospitalizations, surgeries, and ER visits in previous 12 months Vitals Screenings to include cognitive, depression, and falls Referrals and appointments  In addition, I have reviewed and discussed with patient certain preventive protocols, quality metrics, and best practice recommendations. A written personalized care plan for preventive services as well as general preventive health recommendations were provided to patient.     Lorene Dy, MD   05/11/2022

## 2022-05-11 NOTE — Assessment & Plan Note (Signed)
Vitamin D level 18.8 in September.  He is currently taking daily vitamin D supplementation, 2000 units. -Repeat vitamin D level ordered today

## 2022-05-11 NOTE — Patient Instructions (Signed)
  Mr. [name] [name] , Thank you for taking time to come for your Medicare Wellness Visit. I appreciate your ongoing commitment to your health goals. Please review the following plan we discussed and let me know if I can assist you in the future.   These are the goals we discussed: Weight loss. Patient will discuss GLP-1 with PCP.   This is a list of the screening recommended for you and due dates:  Health Maintenance  Topic Date Due   DTaP/Tdap/Td vaccine (1 - Tdap) Never done   Medicare Annual Wellness Visit  02/26/2021   Eye exam for diabetics  10/31/2021   COVID-19 Vaccine (8 - 2023-24 season) 12/26/2021   Hepatitis C Screening: USPSTF Recommendation to screen - Ages 18-79 yo.  04/27/2048*   Hemoglobin A1C  07/03/2022   Complete foot exam   07/05/2022   Yearly kidney health urinalysis for diabetes  09/04/2022   Yearly kidney function blood test for diabetes  01/03/2023   Colon Cancer Screening  08/06/2028   HPV Vaccine  Aged Out   Pneumonia Vaccine  Discontinued   Flu Shot  Discontinued   Zoster (Shingles) Vaccine  Discontinued  *Topic was postponed. The date shown is not the original due date.

## 2022-05-11 NOTE — Assessment & Plan Note (Signed)
Lipid panel last updated in September.  Total cholesterol 226 and LDL 145.  His 10-year ASCVD risk score today is 45%.  He has previously statin therapy. -I reviewed with [name] [name] the indications for statin therapy (DM, LDL above goal, significantly elevated ASCVD risk) over he remains opposed to statin therapy.  I also reviewed statin alternatives, such as Repatha.  He is currently opposed to any cholesterol-lowering medication aside from the fish oil that he currently takes.  He expressed understanding of the indications for treatment but remains opposed to taking any additional medication for hyperlipidemia at this time.

## 2022-05-11 NOTE — Patient Instructions (Signed)
It was a pleasure to see you today.  Thank you for giving [place] the opportunity to be involved in your care.  Below is a brief recap of your visit and next steps.  We will plan to see you again in 4 months.  Summary No medication changes today Repeat A1c and vitamin D levels Plan for follow up in 4 months.

## 2022-05-12 LAB — HEMOGLOBIN A1C
Est. average glucose Bld gHb Est-mCnc: 174 mg/dL
Hgb A1c MFr Bld: 7.7 % — ABNORMAL HIGH (ref 4.8–5.6)

## 2022-05-12 LAB — VITAMIN D 25 HYDROXY (VIT D DEFICIENCY, FRACTURES): Vit D, 25-Hydroxy: 29.7 ng/mL — ABNORMAL LOW (ref 30.0–100.0)

## 2022-05-18 ENCOUNTER — Other Ambulatory Visit: Payer: Self-pay | Admitting: Nurse Practitioner

## 2022-05-18 DIAGNOSIS — J209 Acute bronchitis, unspecified: Secondary | ICD-10-CM | POA: Diagnosis not present

## 2022-05-18 DIAGNOSIS — R030 Elevated blood-pressure reading, without diagnosis of hypertension: Secondary | ICD-10-CM | POA: Diagnosis not present

## 2022-07-22 ENCOUNTER — Telehealth: Payer: Self-pay | Admitting: Internal Medicine

## 2022-07-22 ENCOUNTER — Other Ambulatory Visit: Payer: Self-pay

## 2022-07-22 MED ORDER — GLIPIZIDE ER 5 MG PO TB24: ORAL_TABLET | ORAL | 3 refills | Status: DC

## 2022-07-22 NOTE — Telephone Encounter (Signed)
Refills sent

## 2022-07-22 NOTE — Telephone Encounter (Signed)
Prescription Request  07/22/2022  LOV: 05/11/2022  What is the name of the medication or equipment? glipiZIDE (GLUCOTROL XL) 5 MG 24 hr tablet   Have you contacted your pharmacy to request a refill? No   Which pharmacy would you like this sent to?  [id number] - [place], [place] [employer] [address] [place] [address] Phone: [phone] Fax: [phone]  [facility] ([place], [place] [facility] [address] [place] [address] [place] [address] Phone: [phone] Fax: [place], [place] [address] Phone: [phone] Fax: [phone]    Patient notified that their request is being sent to the clinical staff for review and that they should receive a response within 2 business days.   Please advise at [facility] [phone]

## 2022-09-14 ENCOUNTER — Encounter: Payer: Self-pay | Admitting: Pharmacist

## 2022-09-14 NOTE — Progress Notes (Signed)
[facility]) [facility] Quality Pharmacy Team Statin Quality Measure Assessment  09/14/2022  [name] [name] 07/24/1950 [id number]  Per review of chart and payor information, [name] has a diagnosis of diabetes but is not currently filling a statin prescription.  This places patient into the Statin Use In Patients with Diabetes (SUPD) measure for CMS.    Patient has documented trials of statins with reported severe muscle pain, but no corresponding CPT codes that would exclude patient from SUPD measure.  If clinically appropriate, please consider adding one of the below diagnosis codes to tomorrow's office visit to remove [name] from the measure for 2024.  Code for past statin intolerance or  other exclusions (required annually)  Provider Requirements: Associate code during an office visit or telehealth encounter  Drug Induced Myopathy G72.0   Myopathy, unspecified G72.9   Myositis, unspecified M60.9   Rhabdomyolysis M62.82   Cirrhosis of liver K74.69   Prediabetes R73.03   PCOS E28.2   Thank you for allowing [facility] pharmacy to be a part of this patient's care.  Dellie Burns, PharmD [facility]  [facility] Clinical Pharmacist Office: [phone]

## 2022-09-15 ENCOUNTER — Ambulatory Visit (INDEPENDENT_AMBULATORY_CARE_PROVIDER_SITE_OTHER): Payer: Medicare Other | Admitting: Internal Medicine

## 2022-09-15 ENCOUNTER — Encounter: Payer: Self-pay | Admitting: Internal Medicine

## 2022-09-15 VITALS — BP 133/78 | HR 80 | Ht 69.0 in | Wt 288.8 lb

## 2022-09-15 DIAGNOSIS — Z7984 Long term (current) use of oral hypoglycemic drugs: Secondary | ICD-10-CM

## 2022-09-15 DIAGNOSIS — G720 Drug-induced myopathy: Secondary | ICD-10-CM | POA: Diagnosis not present

## 2022-09-15 DIAGNOSIS — E782 Mixed hyperlipidemia: Secondary | ICD-10-CM

## 2022-09-15 DIAGNOSIS — I10 Essential (primary) hypertension: Secondary | ICD-10-CM

## 2022-09-15 DIAGNOSIS — E1122 Type 2 diabetes mellitus with diabetic chronic kidney disease: Secondary | ICD-10-CM | POA: Diagnosis not present

## 2022-09-15 DIAGNOSIS — I129 Hypertensive chronic kidney disease with stage 1 through stage 4 chronic kidney disease, or unspecified chronic kidney disease: Secondary | ICD-10-CM

## 2022-09-15 DIAGNOSIS — N182 Chronic kidney disease, stage 2 (mild): Secondary | ICD-10-CM | POA: Diagnosis not present

## 2022-09-15 NOTE — Patient Instructions (Signed)
It was a pleasure to see you today.  Thank you for giving us the opportunity to be involved in your care.  Below is a brief recap of your visit and next steps.  We will plan to see you again in 4 months.  Summary No medication changes today We will plan for follow up in 4 months for your annual exam.  

## 2022-09-15 NOTE — Progress Notes (Signed)
Established Patient Office Visit  Subjective   Patient ID: [name] [name], male    DOB: 10/15/1950  Age: 72 y.o. MRN: [id number]  Chief Complaint  Patient presents with   Diabetes    Follow up   [name] [name] returns to care today for routine follow-up.  He was last evaluated by me on 1/15.  No medication changes were made at that time and 60-month follow-up was arranged.  There have been no acute interval events. [name] [name] reports feeling well today.  He is asymptomatic and has no acute concerns to discuss.  Past Medical History:  Diagnosis Date   Diabetes mellitus    Hyperlipidemia    Hypertension    Renal cyst 04/28/2011   Left hemorrhagic hilar cyst   Past Surgical History:  Procedure Laterality Date   COLONOSCOPY N/A 06/28/2014   Procedure: COLONOSCOPY;  Surgeon: [name] Hippo, MD;  Location: [facility];  Service: Endoscopy;  Laterality: N/A;  1030   COLONOSCOPY WITH PROPOFOL N/A 08/06/2021   Procedure: COLONOSCOPY WITH PROPOFOL;  Surgeon: [name] Hippo, MD;  Location: [facility];  Service: Endoscopy;  Laterality: N/A;  1255   NASAL SINUS SURGERY     Social History   Tobacco Use   Smoking status: Former    Passive exposure: Past   Smokeless tobacco: Never   Tobacco comments:    Quit 40 years ago   Vaping Use   Vaping Use: Never used  Substance Use Topics   Alcohol use: No    Alcohol/week: 0.0 standard drinks of alcohol   Drug use: No   Family History  Problem Relation Age of Onset   Cancer Mother    Diabetes Sister    Hypertension Sister    Diabetes Sister    Cancer Daughter    Allergies  Allergen Reactions   Aspirin     Nose Bleeds    Lipitor [Atorvastatin] Other (See Comments)    Nose bleeds   Codeine Palpitations    'heart beats real fast'   Review of Systems  Constitutional:  Negative for chills and fever.  HENT:  Negative for sore throat.   Respiratory:  Negative for cough and shortness of breath.   Cardiovascular:  Negative for chest  pain, palpitations and leg swelling.  Gastrointestinal:  Negative for abdominal pain, blood in stool, constipation, diarrhea, nausea and vomiting.  Genitourinary:  Negative for dysuria and hematuria.  Musculoskeletal:  Negative for myalgias.  Skin:  Negative for itching and rash.  Neurological:  Negative for dizziness and headaches.  Psychiatric/Behavioral:  Negative for depression and suicidal ideas.      Objective:     BP 133/78   Pulse 80   Ht 5\' 9"  (1.753 m)   Wt 288 lb 12.8 oz (131 kg)   SpO2 94%   BMI 42.65 kg/m  BP Readings from Last 3 Encounters:  09/15/22 133/78  05/11/22 (!) 146/83  05/11/22 (!) 146/83   Physical Exam Vitals reviewed.  Constitutional:      General: He is not in acute distress.    Appearance: Normal appearance. He is obese. He is not ill-appearing.  HENT:     Head: Normocephalic and atraumatic.     Right Ear: External ear normal.     Left Ear: External ear normal.     Nose: Nose normal. No congestion or rhinorrhea.     Mouth/Throat:     Mouth: Mucous membranes are moist.     Pharynx: Oropharynx is clear.  Eyes:     General: No scleral icterus.    Extraocular Movements: Extraocular movements intact.     Conjunctiva/sclera: Conjunctivae normal.     Pupils: Pupils are equal, round, and reactive to light.  Cardiovascular:     Rate and Rhythm: Normal rate and regular rhythm.     Pulses: Normal pulses.     Heart sounds: Normal heart sounds. No murmur heard. Pulmonary:     Effort: Pulmonary effort is normal.     Breath sounds: Normal breath sounds. No wheezing, rhonchi or rales.  Abdominal:     General: Abdomen is flat. Bowel sounds are normal. There is no distension.     Palpations: Abdomen is soft.     Tenderness: There is no abdominal tenderness.  Musculoskeletal:        General: No swelling or deformity. Normal range of motion.     Cervical back: Normal range of motion.  Skin:    General: Skin is warm and dry.     Capillary Refill:  Capillary refill takes less than 2 seconds.  Neurological:     General: No focal deficit present.     Mental Status: He is alert and oriented to person, place, and time.     Motor: No weakness.  Psychiatric:        Mood and Affect: Mood normal.        Behavior: Behavior normal.        Thought Content: Thought content normal.    Diabetic foot exam was performed.  No deformities or other abnormal visual findings.  Posterior tibialis and dorsalis pulse intact bilaterally.  Intact to touch and monofilament testing bilaterally.   Last CBC Lab Results  Component Value Date   WBC 5.8 01/02/2022   HGB 14.0 01/02/2022   HCT 42.9 01/02/2022   MCV 88 01/02/2022   MCH 28.6 01/02/2022   RDW 14.6 01/02/2022   PLT 196 01/02/2022   Last metabolic panel Lab Results  Component Value Date   GLUCOSE 205 (H) 01/02/2022   NA 140 01/02/2022   K 4.2 01/02/2022   CL 100 01/02/2022   CO2 24 01/02/2022   BUN 12 01/02/2022   CREATININE 1.24 01/02/2022   EGFR 62 01/02/2022   CALCIUM 9.7 01/02/2022   PROT 6.8 01/02/2022   ALBUMIN 4.4 01/02/2022   LABGLOB 2.4 01/02/2022   AGRATIO 1.8 01/02/2022   BILITOT 0.6 01/02/2022   ALKPHOS 65 01/02/2022   AST 27 01/02/2022   ALT 33 01/02/2022   ANIONGAP 13 07/31/2021   Last lipids Lab Results  Component Value Date   CHOL 226 (H) 01/02/2022   HDL 44 01/02/2022   LDLCALC 145 (H) 01/02/2022   LDLDIRECT 94 06/09/2011   TRIG 203 (H) 01/02/2022   CHOLHDL 5.1 (H) 01/02/2022   Last hemoglobin A1c Lab Results  Component Value Date   HGBA1C 7.7 (H) 05/11/2022   Last thyroid functions Lab Results  Component Value Date   TSH 1.390 01/02/2022   Last vitamin D Lab Results  Component Value Date   VD25OH 29.7 (L) 05/11/2022   The 10-year ASCVD risk score (Arnett DK, et al., 2019) is: 39.3%    Assessment & Plan:   Problem List Items Addressed This Visit       Type 2 DM with CKD stage 2 and hypertension (HCC) - Primary    A1c 7.7 in January.   He is currently prescribed glipizide 10 mg daily and Janumet XR 100-100 mg daily.  -He has previously declined  GLP-1 therapy and remains uninterested today -Urine microalbumin/creatinine ratio ordered -Diabetic foot exam completed today -Due for diabetic eye exam.  He will follow-up with his ophthalmologist in [place].      HYPERTENSION, BENIGN    BP remains well-controlled with lisinopril-HCTZ 20-25 mg daily. -No medication changes today       Drug-induced myopathy    Previously documented trials of statin therapy but is not able to tolerate due to severe muscle pain.      Hyperlipidemia    Lipid panel last updated in September.  Total cholesterol 226 and LDL 145.  His 10-year ASCVD risk remains significantly elevated today, 39.3%.  He has previously documented trials of statin therapy, but has experienced severe muscle pain, epistaxis, and diarrhea.  We have previously discussed Repatha given his significantly elevated LDL and ASCVD risk.  However, he has previously declined Repatha and continues to require declined Repatha again today.  He states that he feels well currently and is not interested in making any medication adjustments.      Return in about 4 months (around 01/16/2023) for CPE.   Billie Lade, MD

## 2022-09-17 LAB — MICROALBUMIN / CREATININE URINE RATIO
Creatinine, Urine: 60.5 mg/dL
Microalb/Creat Ratio: 13 mg/g creat (ref 0–29)
Microalbumin, Urine: 7.8 ug/mL

## 2022-09-18 DIAGNOSIS — G720 Drug-induced myopathy: Secondary | ICD-10-CM | POA: Insufficient documentation

## 2022-09-18 NOTE — Assessment & Plan Note (Signed)
BP remains well-controlled with lisinopril-HCTZ 20-25 mg daily. -No medication changes today

## 2022-09-18 NOTE — Progress Notes (Incomplete)
Established Patient Office Visit  Subjective   Patient ID: [name] [name], male    DOB: 06-07-1950  Age: 72 y.o. MRN: [id number]  Chief Complaint  Patient presents with  . Diabetes    Follow up   Mr. [name] [name] returns to care today for routine follow-up.  He was last evaluated by me on 1/15.  No medication changes were made at that time and 17-month follow-up was arranged.  There have been no acute interval events. Mr. [name] [name] reports feeling well today.  He is asymptomatic and has no acute concerns to discuss.  Past Medical History:  Diagnosis Date  . Diabetes mellitus   . Hyperlipidemia   . Hypertension   . Renal cyst 04/28/2011   Left hemorrhagic hilar cyst   Past Surgical History:  Procedure Laterality Date  . COLONOSCOPY N/A 06/28/2014   Procedure: COLONOSCOPY;  Surgeon: Malissa Hippo, MD;  Location: [facility];  Service: Endoscopy;  Laterality: N/A;  1030  . COLONOSCOPY WITH PROPOFOL N/A 08/06/2021   Procedure: COLONOSCOPY WITH PROPOFOL;  Surgeon: Malissa Hippo, MD;  [facility]: [facility];  Service: Endoscopy;  Laterality: N/A;  1255  . NASAL SINUS SURGERY     Social History   Tobacco Use  . Smoking status: Former    Passive exposure: Past  . Smokeless tobacco: Never  . Tobacco comments:    Quit 40 years ago   Vaping Use  . Vaping Use: Never used  Substance Use Topics  . Alcohol use: No    Alcohol/week: 0.0 standard drinks of alcohol  . Drug use: No   Family History  Problem Relation Age of Onset  . Cancer Mother   . Diabetes Sister   . Hypertension Sister   . Diabetes Sister   . Cancer Daughter    Allergies  Allergen Reactions  . Aspirin     Nose Bleeds   . Lipitor [Atorvastatin] Other (See Comments)    Nose bleeds  . Codeine Palpitations    'heart beats real fast'   Review of Systems  Constitutional:  Negative for chills and fever.  HENT:  Negative for sore throat.   Respiratory:  Negative for cough and shortness of breath.   Cardiovascular:   Negative for chest pain, palpitations and leg swelling.  Gastrointestinal:  Negative for abdominal pain, blood in stool, constipation, diarrhea, nausea and vomiting.  Genitourinary:  Negative for dysuria and hematuria.  Musculoskeletal:  Negative for myalgias.  Skin:  Negative for itching and rash.  Neurological:  Negative for dizziness and headaches.  Psychiatric/Behavioral:  Negative for depression and suicidal ideas.      Objective:     BP 133/78   Pulse 80   Ht 5\' 9"  (1.753 m)   Wt 288 lb 12.8 oz (131 kg)   SpO2 94%   BMI 42.65 kg/m  BP Readings from Last 3 Encounters:  09/15/22 133/78  05/11/22 (!) 146/83  05/11/22 (!) 146/83   Physical Exam Vitals reviewed.  Constitutional:      General: He is not in acute distress.    Appearance: Normal appearance. He is obese. He is not ill-appearing.  HENT:     Head: Normocephalic and atraumatic.     Right Ear: External ear normal.     Left Ear: External ear normal.     Nose: Nose normal. No congestion or rhinorrhea.     Mouth/Throat:     Mouth: Mucous membranes are moist.     Pharynx: Oropharynx is clear.  Eyes:     General: No scleral icterus.    Extraocular Movements: Extraocular movements intact.     Conjunctiva/sclera: Conjunctivae normal.     Pupils: Pupils are equal, round, and reactive to light.  Cardiovascular:     Rate and Rhythm: Normal rate and regular rhythm.     Pulses: Normal pulses.     Heart sounds: Normal heart sounds. No murmur heard. Pulmonary:     Effort: Pulmonary effort is normal.     Breath sounds: Normal breath sounds. No wheezing, rhonchi or rales.  Abdominal:     General: Abdomen is flat. Bowel sounds are normal. There is no distension.     Palpations: Abdomen is soft.     Tenderness: There is no abdominal tenderness.  Musculoskeletal:        General: No swelling or deformity. Normal range of motion.     Cervical back: Normal range of motion.  Skin:    General: Skin is warm and dry.      Capillary Refill: Capillary refill takes less than 2 seconds.  Neurological:     General: No focal deficit present.     Mental Status: He is alert and oriented to person, place, and time.     Motor: No weakness.  Psychiatric:        Mood and Affect: Mood normal.        Behavior: Behavior normal.        Thought Content: Thought content normal.    Diabetic foot exam was performed.  No deformities or other abnormal visual findings.  Posterior tibialis and dorsalis pulse intact bilaterally.  Intact to touch and monofilament testing bilaterally.   Last CBC Lab Results  Component Value Date   WBC 5.8 01/02/2022   HGB 14.0 01/02/2022   HCT 42.9 01/02/2022   MCV 88 01/02/2022   MCH 28.6 01/02/2022   RDW 14.6 01/02/2022   PLT 196 01/02/2022   Last metabolic panel Lab Results  Component Value Date   GLUCOSE 205 (H) 01/02/2022   NA 140 01/02/2022   K 4.2 01/02/2022   CL 100 01/02/2022   CO2 24 01/02/2022   BUN 12 01/02/2022   CREATININE 1.24 01/02/2022   EGFR 62 01/02/2022   CALCIUM 9.7 01/02/2022   PROT 6.8 01/02/2022   ALBUMIN 4.4 01/02/2022   LABGLOB 2.4 01/02/2022   AGRATIO 1.8 01/02/2022   BILITOT 0.6 01/02/2022   ALKPHOS 65 01/02/2022   AST 27 01/02/2022   ALT 33 01/02/2022   ANIONGAP 13 07/31/2021   Last lipids Lab Results  Component Value Date   CHOL 226 (H) 01/02/2022   HDL 44 01/02/2022   LDLCALC 145 (H) 01/02/2022   LDLDIRECT 94 06/09/2011   TRIG 203 (H) 01/02/2022   CHOLHDL 5.1 (H) 01/02/2022   Last hemoglobin A1c Lab Results  Component Value Date   HGBA1C 7.7 (H) 05/11/2022   Last thyroid functions Lab Results  Component Value Date   TSH 1.390 01/02/2022   Last vitamin D Lab Results  Component Value Date   VD25OH 29.7 (L) 05/11/2022   The 10-year ASCVD risk score (Arnett DK, et al., 2019) is: 39.3%    Assessment & Plan:   Problem List Items Addressed This Visit   None   No follow-ups on file.    Billie Lade, MD

## 2022-09-18 NOTE — Assessment & Plan Note (Signed)
A1c 7.7 in January.  He is currently prescribed glipizide 10 mg daily and Janumet XR 100-100 mg daily.  -He has previously declined GLP-1 therapy and remains uninterested today -Urine microalbumin/creatinine ratio ordered -Diabetic foot exam completed today -Due for diabetic eye exam.  He will follow-up with his ophthalmologist in [place].

## 2022-09-18 NOTE — Assessment & Plan Note (Signed)
Lipid panel last updated in September.  Total cholesterol 226 and LDL 145.  His 10-year ASCVD risk remains significantly elevated today, 39.3%.  He has previously documented trials of statin therapy, but has experienced severe muscle pain, epistaxis, and diarrhea.  We have previously discussed Repatha given his significantly elevated LDL and ASCVD risk.  However, he has previously declined Repatha and continues to require declined Repatha again today.  He states that he feels well currently and is not interested in making any medication adjustments.

## 2022-09-18 NOTE — Assessment & Plan Note (Addendum)
Previously documented trials of statin therapy but is not able to tolerate due to severe muscle pain.

## 2022-11-30 LAB — HM DIABETES EYE EXAM

## 2023-01-01 ENCOUNTER — Other Ambulatory Visit: Payer: Self-pay | Admitting: Internal Medicine

## 2023-01-01 DIAGNOSIS — I129 Hypertensive chronic kidney disease with stage 1 through stage 4 chronic kidney disease, or unspecified chronic kidney disease: Secondary | ICD-10-CM

## 2023-01-22 ENCOUNTER — Ambulatory Visit (INDEPENDENT_AMBULATORY_CARE_PROVIDER_SITE_OTHER): Payer: Medicare Other | Admitting: Internal Medicine

## 2023-01-22 ENCOUNTER — Encounter: Payer: Self-pay | Admitting: Internal Medicine

## 2023-01-22 VITALS — BP 130/81 | HR 77 | Ht 69.0 in | Wt 282.8 lb

## 2023-01-22 DIAGNOSIS — I10 Essential (primary) hypertension: Secondary | ICD-10-CM

## 2023-01-22 DIAGNOSIS — E559 Vitamin D deficiency, unspecified: Secondary | ICD-10-CM | POA: Diagnosis not present

## 2023-01-22 DIAGNOSIS — E1122 Type 2 diabetes mellitus with diabetic chronic kidney disease: Secondary | ICD-10-CM | POA: Diagnosis not present

## 2023-01-22 DIAGNOSIS — I129 Hypertensive chronic kidney disease with stage 1 through stage 4 chronic kidney disease, or unspecified chronic kidney disease: Secondary | ICD-10-CM | POA: Diagnosis not present

## 2023-01-22 DIAGNOSIS — N182 Chronic kidney disease, stage 2 (mild): Secondary | ICD-10-CM | POA: Diagnosis not present

## 2023-01-22 DIAGNOSIS — Z2821 Immunization not carried out because of patient refusal: Secondary | ICD-10-CM | POA: Diagnosis not present

## 2023-01-22 DIAGNOSIS — E782 Mixed hyperlipidemia: Secondary | ICD-10-CM

## 2023-01-22 NOTE — Assessment & Plan Note (Signed)
BMI 41.7.  Lifestyle modifications aimed at weight loss were reviewed again today.  As otherwise documented, he is potentially open to starting Rybelsus but has reservations about adverse side effects.  Plan to further discuss pending A1c result.

## 2023-01-22 NOTE — Assessment & Plan Note (Signed)
Repeat CMP ordered today.  Currently prescribed ACEi

## 2023-01-22 NOTE — Assessment & Plan Note (Signed)
 Remains adequately controlled on current antihypertensive regimen.  No medication changes are indicated today.

## 2023-01-22 NOTE — Progress Notes (Signed)
Established Patient Office Visit  Subjective   Patient ID: [name] [name], male    DOB: Oct 29, 1950  Age: 72 y.o. MRN: [id number]  Chief Complaint  Patient presents with   Diabetes    Four month follow up    Mr. [name] [name] returns to care today for routine follow-up.  He was last evaluated by me on 5/21.  No medication changes were made at that time and 59-month follow-up was arranged.  There have been no acute interval events. [name] [name] reports feeling well today.  He is asymptomatic and has no acute concerns to discuss.  Past Medical History:  Diagnosis Date   Diabetes mellitus    Hyperlipidemia    Hypertension    Renal cyst 04/28/2011   Left hemorrhagic hilar cyst   Past Surgical History:  Procedure Laterality Date   COLONOSCOPY N/A 06/28/2014   Procedure: COLONOSCOPY;  Surgeon: Malissa Hippo, MD;  [facility]: [facility];  Service: Endoscopy;  Laterality: N/A;  1030   COLONOSCOPY WITH PROPOFOL N/A 08/06/2021   Procedure: COLONOSCOPY WITH PROPOFOL;  Surgeon: Malissa Hippo, MD;  [facility];  Service: Endoscopy;  Laterality: N/A;  1255   NASAL SINUS SURGERY     Social History   Tobacco Use   Smoking status: Former    Passive exposure: Past   Smokeless tobacco: Never   Tobacco comments:    Quit 40 years ago   Vaping Use   Vaping status: Never Used  Substance Use Topics   Alcohol use: No    Alcohol/week: 0.0 standard drinks of alcohol   Drug use: No   Family History  Problem Relation Age of Onset   Cancer Mother    Diabetes Sister    Hypertension Sister    Diabetes Sister    Cancer Daughter    Allergies  Allergen Reactions   Aspirin     Nose Bleeds    Lipitor [Atorvastatin] Other (See Comments)    Nose bleeds   Codeine Palpitations    'heart beats real fast'   Review of Systems  Constitutional:  Negative for chills and fever.  HENT:  Negative for sore throat.   Respiratory:  Negative for cough and shortness of breath.   Cardiovascular:  Negative  for chest pain, palpitations and leg swelling.  Gastrointestinal:  Negative for abdominal pain, blood in stool, constipation, diarrhea, nausea and vomiting.  Genitourinary:  Negative for dysuria and hematuria.  Musculoskeletal:  Negative for myalgias.  Skin:  Negative for itching and rash.  Neurological:  Negative for dizziness and headaches.  Psychiatric/Behavioral:  Negative for depression and suicidal ideas.      Objective:     BP 130/81 (BP Location: Left Arm, Patient Position: Sitting, Cuff Size: Large)   Pulse 77   Ht 5\' 9"  (1.753 m)   Wt 282 lb 12.8 oz (128.3 kg)   SpO2 93%   BMI 41.76 kg/m  BP Readings from Last 3 Encounters:  01/22/23 130/81  09/15/22 133/78  05/11/22 (!) 146/83   Physical Exam Vitals reviewed.  Constitutional:      General: He is not in acute distress.    Appearance: Normal appearance. He is obese. He is not ill-appearing.  HENT:     Head: Normocephalic and atraumatic.     Right Ear: External ear normal.     Left Ear: External ear normal.     Nose: Nose normal. No congestion or rhinorrhea.     Mouth/Throat:     Mouth: Mucous  membranes are moist.     Pharynx: Oropharynx is clear.  Eyes:     General: No scleral icterus.    Extraocular Movements: Extraocular movements intact.     Conjunctiva/sclera: Conjunctivae normal.     Pupils: Pupils are equal, round, and reactive to light.  Cardiovascular:     Rate and Rhythm: Normal rate and regular rhythm.     Pulses: Normal pulses.     Heart sounds: Normal heart sounds. No murmur heard. Pulmonary:     Effort: Pulmonary effort is normal.     Breath sounds: Normal breath sounds. No wheezing, rhonchi or rales.  Abdominal:     General: Abdomen is flat. Bowel sounds are normal. There is no distension.     Palpations: Abdomen is soft.     Tenderness: There is no abdominal tenderness.  Musculoskeletal:        General: No swelling or deformity. Normal range of motion.     Cervical back: Normal range of  motion.  Skin:    General: Skin is warm and dry.     Capillary Refill: Capillary refill takes less than 2 seconds.  Neurological:     General: No focal deficit present.     Mental Status: He is alert and oriented to person, place, and time.     Motor: No weakness.  Psychiatric:        Mood and Affect: Mood normal.        Behavior: Behavior normal.        Thought Content: Thought content normal.   Last CBC Lab Results  Component Value Date   WBC 5.8 01/02/2022   HGB 14.0 01/02/2022   HCT 42.9 01/02/2022   MCV 88 01/02/2022   MCH 28.6 01/02/2022   RDW 14.6 01/02/2022   PLT 196 01/02/2022   Last metabolic panel Lab Results  Component Value Date   GLUCOSE 205 (H) 01/02/2022   NA 140 01/02/2022   K 4.2 01/02/2022   CL 100 01/02/2022   CO2 24 01/02/2022   BUN 12 01/02/2022   CREATININE 1.24 01/02/2022   EGFR 62 01/02/2022   CALCIUM 9.7 01/02/2022   PROT 6.8 01/02/2022   ALBUMIN 4.4 01/02/2022   LABGLOB 2.4 01/02/2022   AGRATIO 1.8 01/02/2022   BILITOT 0.6 01/02/2022   ALKPHOS 65 01/02/2022   AST 27 01/02/2022   ALT 33 01/02/2022   ANIONGAP 13 07/31/2021   Last lipids Lab Results  Component Value Date   CHOL 226 (H) 01/02/2022   HDL 44 01/02/2022   LDLCALC 145 (H) 01/02/2022   LDLDIRECT 94 06/09/2011   TRIG 203 (H) 01/02/2022   CHOLHDL 5.1 (H) 01/02/2022   Last hemoglobin A1c Lab Results  Component Value Date   HGBA1C 7.7 (H) 05/11/2022   Last thyroid functions Lab Results  Component Value Date   TSH 1.390 01/02/2022   Last vitamin D Lab Results  Component Value Date   VD25OH 29.7 (L) 05/11/2022   The 10-year ASCVD risk score (Arnett DK, et al., 2019) is: 39%    Assessment & Plan:   Problem List Items Addressed This Visit       Type 2 DM with CKD stage 2 and hypertension (HCC) - Primary    A1c 7.7 on labs from January.  He is currently prescribed glipizide 10 mg daily and Janumet XR [phone] mg daily. -Repeat A1c ordered today.  He is  potentially open to starting Rybelsus pending A1c result, but has reservations about starting any new medications  due to concern for possible adverse side effects.      HYPERTENSION, BENIGN    Remains adequately controlled on current antihypertensive regimen.  No medication changes are indicated today.      CKD (chronic kidney disease), stage II    Repeat CMP ordered today.  Currently prescribed ACEi      Hyperlipidemia    Lipid panel last updated in September 2023.  Total cholesterol 226 and LDL 145.  He has a previously documented history of statin intolerance.  We have also previously discussed Repatha but he continues to decline starting it.  He remains focused on lifestyle modifications aimed at weight loss and improving his cholesterol. -Repeat lipid panel ordered today      Morbid obesity (HCC)    BMI 41.7.  Lifestyle modifications aimed at weight loss were reviewed again today.  As otherwise documented, he is potentially open to starting Rybelsus but has reservations about adverse side effects.  Plan to further discuss pending A1c result.       Return in about 6 months (around 07/22/2023).   Billie Lade, MD

## 2023-01-22 NOTE — Assessment & Plan Note (Signed)
A1c 7.7 on labs from January.  He is currently prescribed glipizide 10 mg daily and Janumet XR [phone] mg daily. -Repeat A1c ordered today.  He is potentially open to starting Rybelsus pending A1c result, but has reservations about starting any new medications due to concern for possible adverse side effects.

## 2023-01-22 NOTE — Assessment & Plan Note (Signed)
Lipid panel last updated in September 2023.  Total cholesterol 226 and LDL 145.  He has a previously documented history of statin intolerance.  We have also previously discussed Repatha but he continues to decline starting it.  He remains focused on lifestyle modifications aimed at weight loss and improving his cholesterol. -Repeat lipid panel ordered today

## 2023-01-22 NOTE — Patient Instructions (Signed)
It was a pleasure to see you today.  Thank you for giving [place] the opportunity to be involved in your care.  Below is a brief recap of your visit and next steps.  We will plan to see you again in 6 months.  Summary No medication changes today Repeat labs ordered Follow up in 6 months

## 2023-01-23 LAB — LIPID PANEL
Chol/HDL Ratio: 4.9 {ratio} (ref 0.0–5.0)
Cholesterol, Total: 224 mg/dL — ABNORMAL HIGH (ref 100–199)
HDL: 46 mg/dL (ref >39)
LDL Chol Calc (NIH): 149 mg/dL — ABNORMAL HIGH (ref 0–99)
Triglycerides: 161 mg/dL — ABNORMAL HIGH (ref 0–149)
VLDL Cholesterol Cal: 29 mg/dL (ref 5–40)

## 2023-01-23 LAB — CBC WITH DIFFERENTIAL/PLATELET
Basophils Absolute: 0 10*3/uL (ref 0.0–0.2)
Basos: 0 %
EOS (ABSOLUTE): 0.1 10*3/uL (ref 0.0–0.4)
Eos: 3 %
Hematocrit: 42.3 % (ref 37.5–51.0)
Hemoglobin: 13.9 g/dL (ref 13.0–17.7)
Immature Grans (Abs): 0 10*3/uL (ref 0.0–0.1)
Immature Granulocytes: 0 %
Lymphocytes Absolute: 1.9 10*3/uL (ref 0.7–3.1)
Lymphs: 39 %
MCH: 29 pg (ref 26.6–33.0)
MCHC: 32.9 g/dL (ref 31.5–35.7)
MCV: 88 fL (ref 79–97)
Monocytes Absolute: 0.5 10*3/uL (ref 0.1–0.9)
Monocytes: 10 %
Neutrophils Absolute: 2.3 10*3/uL (ref 1.4–7.0)
Neutrophils: 48 %
Platelets: 209 10*3/uL (ref 150–450)
RBC: 4.8 x10E6/uL (ref 4.14–5.80)
RDW: 14.1 % (ref 11.6–15.4)
WBC: 4.8 10*3/uL (ref 3.4–10.8)

## 2023-01-23 LAB — TSH+FREE T4
Free T4: 1.06 ng/dL (ref 0.82–1.77)
TSH: 1.33 u[IU]/mL (ref 0.450–4.500)

## 2023-01-23 LAB — B12 AND FOLATE PANEL
Folate: 17.4 ng/mL (ref >3.0)
Vitamin B-12: >2000 pg/mL — ABNORMAL HIGH (ref 232–1245)

## 2023-01-23 LAB — HEMOGLOBIN A1C
Est. average glucose Bld gHb Est-mCnc: 194 mg/dL
Hgb A1c MFr Bld: 8.4 % — ABNORMAL HIGH (ref 4.8–5.6)

## 2023-01-23 LAB — CMP14+EGFR
ALT: 21 [IU]/L (ref 0–44)
AST: 20 [IU]/L (ref 0–40)
Albumin: 4.4 g/dL (ref 3.8–4.8)
Alkaline Phosphatase: 61 [IU]/L (ref 44–121)
BUN/Creatinine Ratio: 9 — ABNORMAL LOW (ref 10–24)
BUN: 12 mg/dL (ref 8–27)
Bilirubin Total: 0.6 mg/dL (ref 0.0–1.2)
CO2: 23 mmol/L (ref 20–29)
Calcium: 9.6 mg/dL (ref 8.6–10.2)
Chloride: 98 mmol/L (ref 96–106)
Creatinine, Ser: 1.31 mg/dL — ABNORMAL HIGH (ref 0.76–1.27)
Globulin, Total: 2.4 g/dL (ref 1.5–4.5)
Glucose: 206 mg/dL — ABNORMAL HIGH (ref 70–99)
Potassium: 4.4 mmol/L (ref 3.5–5.2)
Sodium: 138 mmol/L (ref 134–144)
Total Protein: 6.8 g/dL (ref 6.0–8.5)
eGFR: 57 mL/min/{1.73_m2} — ABNORMAL LOW (ref >59)

## 2023-01-23 LAB — VITAMIN D 25 HYDROXY (VIT D DEFICIENCY, FRACTURES): Vit D, 25-Hydroxy: 21.5 ng/mL — ABNORMAL LOW (ref 30.0–100.0)

## 2023-03-06 ENCOUNTER — Other Ambulatory Visit: Payer: Self-pay | Admitting: Internal Medicine

## 2023-03-06 DIAGNOSIS — E1122 Type 2 diabetes mellitus with diabetic chronic kidney disease: Secondary | ICD-10-CM

## 2023-05-10 ENCOUNTER — Other Ambulatory Visit: Payer: Self-pay | Admitting: Internal Medicine

## 2023-06-09 ENCOUNTER — Other Ambulatory Visit: Payer: Self-pay

## 2023-06-09 ENCOUNTER — Telehealth: Payer: Self-pay | Admitting: Internal Medicine

## 2023-06-09 ENCOUNTER — Ambulatory Visit (INDEPENDENT_AMBULATORY_CARE_PROVIDER_SITE_OTHER): Payer: PPO

## 2023-06-09 VITALS — BP 131/75 | HR 48 | Ht 73.0 in | Wt 288.0 lb

## 2023-06-09 DIAGNOSIS — Z5320 Procedure and treatment not carried out because of patient's decision for unspecified reasons: Secondary | ICD-10-CM

## 2023-06-09 DIAGNOSIS — Z0000 Encounter for general adult medical examination without abnormal findings: Secondary | ICD-10-CM | POA: Diagnosis not present

## 2023-06-09 DIAGNOSIS — E1122 Type 2 diabetes mellitus with diabetic chronic kidney disease: Secondary | ICD-10-CM

## 2023-06-09 DIAGNOSIS — Z2821 Immunization not carried out because of patient refusal: Secondary | ICD-10-CM

## 2023-06-09 MED ORDER — JANUMET XR 100-1000 MG PO TB24: 1.0000 | ORAL_TABLET | Freq: Every day | ORAL | 0 refills | Status: DC

## 2023-06-09 NOTE — Telephone Encounter (Signed)
Prescription Request  06/09/2023  LOV: 01/22/2023  What is the name of the medication or equipment? SitaGLIPtin-MetFORMIN HCl (JANUMET XR) [phone] MG TB24 [[id number]]   REQUEST 90 DAY SUPPLY  Have you contacted your pharmacy to request a refill? No   Which pharmacy would you like this sent to?  [facility]   Patient notified that their request is being sent to the clinical staff for review and that they should receive a response within 2 business days.   Please advise at  walked into the office

## 2023-06-09 NOTE — Patient Instructions (Signed)
[name] [name] , Thank you for taking time to come for your Medicare Wellness Visit. I appreciate your ongoing commitment to your health goals. Please review the following plan we discussed and let me know if I can assist you in the future.   Referrals/Orders/Follow-Ups/Clinician Recommendations:  Next Medicare Annual Wellness Visit:  June 12, 2024 at 3:10 pm telephone visit.   Vaccinations: declines all Influenza vaccine: recommend every Fall Pneumococcal vaccine: recommend once per lifetime Prevnar-20 Tdap vaccine: recommend every 10 years Shingles vaccine: recommend Shingrix which is 2 doses 2-6 months apart and over 90% effective     Covid-19: recommend 2 doses one month apart with a booster 6 months later   This is a list of the screening recommended for you and due dates:  Health Maintenance  Topic Date Due   DTaP/Tdap/Td vaccine (1 - Tdap) Never done   COVID-19 Vaccine (8 - 2024-25 season) 12/27/2022   Pneumonia Vaccine (1 of 2 - PCV) 07/21/2023*   Flu Shot  04/27/2028*   Hepatitis C Screening  04/27/2048*   Hemoglobin A1C  07/22/2023   Yearly kidney health urinalysis for diabetes  09/15/2023   Complete foot exam   09/15/2023   Eye exam for diabetics  11/30/2023   Yearly kidney function blood test for diabetes  01/22/2024   Medicare Annual Wellness Visit  06/08/2024   Colon Cancer Screening  08/06/2028   HPV Vaccine  Aged Out   Zoster (Shingles) Vaccine  Discontinued  *Topic was postponed. The date shown is not the original due date.    Advanced directives: (Provided) Advance directive discussed with you today. I have provided a copy for you to complete at home and have notarized. Once this is complete, please bring a copy in to our office so we can scan it into your chart.   Next Medicare Annual Wellness Visit scheduled for next year: yes  Preventive Care 60 Years and Older, Male Preventive care refers to lifestyle choices and visits with your health care provider that  can promote health and wellness. Preventive care visits are also called wellness exams. What can I expect for my preventive care visit? Counseling During your preventive care visit, your health care provider may ask about your: Medical history, including: Past medical problems. Family medical history. History of falls. Current health, including: Emotional well-being. Home life and relationship well-being. Sexual activity. Memory and ability to understand (cognition). Lifestyle, including: Alcohol, nicotine or tobacco, and drug use. Access to firearms. Diet, exercise, and sleep habits. Work and work Astronomer. Sunscreen use. Safety issues such as seatbelt and bike helmet use. Physical exam Your health care provider will check your: Height and weight. These may be used to calculate your BMI (body mass index). BMI is a measurement that tells if you are at a healthy weight. Waist circumference. This measures the distance around your waistline. This measurement also tells if you are at a healthy weight and may help predict your risk of certain diseases, such as type 2 diabetes and high blood pressure. Heart rate and blood pressure. Body temperature. Skin for abnormal spots. What immunizations do I need?  Vaccines are usually given at various ages, according to a schedule. Your health care provider will recommend vaccines for you based on your age, medical history, and lifestyle or other factors, such as travel or where you work. What tests do I need? Screening Your health care provider may recommend screening tests for certain conditions. This may include: Lipid and cholesterol levels. Diabetes screening. This  is done by checking your blood sugar (glucose) after you have not eaten for a while (fasting). Hepatitis C test. Hepatitis B test. HIV (human immunodeficiency virus) test. STI (sexually transmitted infection) testing, if you are at risk. Lung cancer screening. Colorectal  cancer screening. Prostate cancer screening. Abdominal aortic aneurysm (AAA) screening. You may need this if you are a current or former smoker. Talk with your health care provider about your test results, treatment options, and if necessary, the need for more tests. Follow these instructions at home: Eating and drinking  Eat a diet that includes fresh fruits and vegetables, whole grains, lean protein, and low-fat dairy products. Limit your intake of foods with high amounts of sugar, saturated fats, and salt. Take vitamin and mineral supplements as recommended by your health care provider. Do not drink alcohol if your health care provider tells you not to drink. If you drink alcohol: Limit how much you have to 0-2 drinks a day. Know how much alcohol is in your drink. In the [place] one drink equals one 12 oz bottle of beer (355 mL), one 5 oz glass of wine (148 mL), or one 1 oz glass of hard liquor (44 mL). Lifestyle Brush your teeth every morning and night with fluoride toothpaste. Floss one time each day. Exercise for at least 30 minutes 5 or more days each week. Do not use any products that contain nicotine or tobacco. These products include cigarettes, chewing tobacco, and vaping devices, such as e-cigarettes. If you need help quitting, ask your health care provider. Do not use drugs. If you are sexually active, practice safe sex. Use a condom or other form of protection to prevent STIs. Take aspirin only as told by your health care provider. Make sure that you understand how much to take and what form to take. Work with your health care provider to find out whether it is safe and beneficial for you to take aspirin daily. Ask your health care provider if you need to take a cholesterol-lowering medicine (statin). Find healthy ways to manage stress, such as: Meditation, yoga, or listening to music. Journaling. Talking to a trusted person. Spending time with friends and  family. Safety Always wear your seat belt while driving or riding in a vehicle. Do not drive: If you have been drinking alcohol. Do not ride with someone who has been drinking. When you are tired or distracted. While texting. If you have been using any mind-altering substances or drugs. Wear a helmet and other protective equipment during sports activities. If you have firearms in your house, make sure you follow all gun safety procedures. Minimize exposure to UV radiation to reduce your risk of skin cancer. What's next? Visit your health care provider once a year for an annual wellness visit. Ask your health care provider how often you should have your eyes and teeth checked. Stay up to date on all vaccines. This information is not intended to replace advice given to you by your health care provider. Make sure you discuss any questions you have with your health care provider. Document Revised: 10/09/2020 Document Reviewed: 10/09/2020 Elsevier Patient Education  2024 [employer].  Understanding Your Risk for Falls Millions of people have serious injuries from falls each year. It is important to understand your risk of falling. Talk with your health care provider about your risk and what you can do to lower it. If you do have a serious fall, make sure to tell your provider. Falling once raises your risk of  falling again. How can falls affect me? Serious injuries from falls are common. These include: Broken bones, such as hip fractures. Head injuries, such as traumatic brain injuries (TBI) or concussions. A fear of falling can cause you to avoid activities and stay at home. This can make your muscles weaker and raise your risk for a fall. What can increase my risk? There are a number of risk factors that increase your risk for falling. The more risk factors you have, the higher your risk of falling. Serious injuries from a fall happen most often to people who are older than 74 years old.  Teenagers and young adults ages 33-29 are also at higher risk. Common risk factors include: Weakness in the lower body. Being generally weak or confused due to long-term (chronic) illness. Dizziness or balance problems. Poor vision. Medicines that cause dizziness or drowsiness. These may include: Medicines for your blood pressure, heart, anxiety, insomnia, or swelling (edema). Pain medicines. Muscle relaxants. Other risk factors include: Drinking alcohol. Having had a fall in the past. Having foot pain or wearing improper footwear. Working at a dangerous job. Having any of the following in your home: Tripping hazards, such as floor clutter or loose rugs. Poor lighting. Pets. Having dementia or memory loss. What actions can I take to lower my risk of falling?     Physical activity Stay physically fit. Do strength and balance exercises. Consider taking a regular class to build strength and balance. Yoga and tai chi are good options. Vision Have your eyes checked every year and your prescription for glasses or contacts updated as needed. Shoes and walking aids Wear non-skid shoes. Wear shoes that have rubber soles and low heels. Do not wear high heels. Do not walk around the house in socks or slippers. Use a cane or walker as told by your provider. Home safety Attach secure railings on both sides of your stairs. Install grab bars for your bathtub, shower, and toilet. Use a non-skid mat in your bathtub or shower. Attach bath mats securely with double-sided, non-slip rug tape. Use good lighting in all rooms. Keep a flashlight near your bed. Make sure there is a clear path from your bed to the bathroom. Use night-lights. Do not use throw rugs. Make sure all carpeting is taped or tacked down securely. Remove all clutter from walkways and stairways, including extension cords. Repair uneven or broken steps and floors. Avoid walking on icy or slippery surfaces. Walk on the grass  instead of on icy or slick sidewalks. Use ice melter to get rid of ice on walkways in the winter. Use a cordless phone. Questions to ask your health care provider Can you help me check my risk for a fall? Do any of my medicines make me more likely to fall? Should I take a vitamin D supplement? What exercises can I do to improve my strength and balance? Should I make an appointment to have my vision checked? Do I need a bone density test to check for weak bones (osteoporosis)? Would it help to use a cane or a walker? Where to find more information Centers for Disease Control and Prevention, [employer]: TonerPromos.no Community-Based Fall Prevention Programs: TonerPromos.no [employer] on Aging: BaseRingTones.pl Contact a health care provider if: You fall at home. You are afraid of falling at home. You feel weak, drowsy, or dizzy. This information is not intended to replace advice given to you by your health care provider. Make sure you discuss any questions you have with your  health care provider. Document Revised: 12/15/2021 Document Reviewed: 12/15/2021 Elsevier Patient Education  2024 [employer].

## 2023-06-09 NOTE — Telephone Encounter (Signed)
Med refilled.

## 2023-06-09 NOTE — Progress Notes (Signed)
 Because this visit was a virtual/telehealth visit,  certain criteria was not obtained, such a blood pressure, CBG if applicable, and timed get up and go. Any medications not marked as "taking" were not mentioned during the medication reconciliation part of the visit. Any vitals not documented were not able to be obtained due to this being a telehealth visit or patient was unable to self-report a recent blood pressure reading due to a lack of equipment at home via telehealth. Vitals that have been documented are verbally provided by the patient.  Interactive audio and video telecommunications were attempted between this provider and patient, however failed, due to patient having technical difficulties OR patient did not have access to video capability.  We continued and completed visit with audio only.  Subjective:   [name] [name] is a 73 y.o. male who presents for Medicare Annual/Subsequent preventive examination.  Visit Complete: Virtual I connected with  [name] [name] on 06/09/23 by a audio enabled telemedicine application and verified that I am speaking with the correct person using two identifiers.  Patient Location: Home  Provider Location: Home Office  I discussed the limitations of evaluation and management by telemedicine. The patient expressed understanding and agreed to proceed.  Vital Signs: Because this visit was a virtual/telehealth visit, some criteria may be missing or patient reported. Any vitals not documented were not able to be obtained and vitals that have been documented are patient reported.  Cardiac Risk Factors include: advanced age (>19men, >45 women);diabetes mellitus;dyslipidemia;hypertension;male gender;obesity (BMI >30kg/m2)     Objective:    Today's Vitals   06/09/23 0906  BP: 131/75  Pulse: (!) 48  Weight: 288 lb (130.6 kg)  Height: 6\' 1"  (1.854 m)   Body mass index is 38 kg/m.     06/09/2023    9:24 AM 05/11/2022    8:51 AM 08/06/2021   11:00 AM 07/31/2021    10:51 AM 05/19/2021    2:41 AM 12/24/2017    9:10 AM 06/08/2016    1:38 PM  Advanced Directives  Does Patient Have a Medical Advance Directive? No No No No No No No  Would patient like information on creating a medical advance directive? Yes (MAU/Ambulatory/Procedural Areas - Information given) Yes (Inpatient - patient defers creating a medical advance directive at this time - Information given) No - Patient declined No - Patient declined No - Patient declined No - Patient declined No - Patient declined    Current Medications (verified) Outpatient Encounter Medications as of 06/09/2023  Medication Sig   Calcium Carb-Cholecalciferol (CALCIUM 500/D PO) Take 1 tablet by mouth in the morning.   Cholecalciferol (VITAMIN D3) 1000 units CAPS Take 1,000 Units by mouth in the morning.   Cinnamon 500 MG capsule Take 500 mg by mouth in the morning.   Ginkgo Biloba 120 MG CAPS Take 1 capsule by mouth daily.   glipiZIDE (GLUCOTROL XL) 10 MG 24 hr tablet Take 1 tablet (10 mg total) by mouth daily with breakfast.   lisinopril-hydrochlorothiazide (ZESTORETIC) 20-25 MG tablet TAKE 1 TABLET BY MOUTH DAILY   Multiple Vitamin (MULTIVITAMIN WITH MINERALS) TABS tablet Take 1 tablet by mouth daily.   Omega-3 Fatty Acids (FISH OIL) 1000 MG CAPS Take 1 capsule (1,000 mg total) by mouth daily.   SitaGLIPtin-MetFORMIN HCl (JANUMET XR) [phone] MG TB24 TAKE 1 TABLET BY MOUTH DAILY   TURMERIC PO Take 1,500 mg by mouth. Once a day   vitamin B-12 (CYANOCOBALAMIN) 1000 MCG tablet Take 1,000 mcg by mouth in the morning.  No facility-administered encounter medications on file as of 06/09/2023.    Allergies (verified) Aspirin, Lipitor [atorvastatin], and Codeine   History: Past Medical History:  Diagnosis Date   Diabetes mellitus    Hyperlipidemia    Hypertension    Renal cyst 04/28/2011   Left hemorrhagic hilar cyst   Past Surgical History:  Procedure Laterality Date   COLONOSCOPY N/A 06/28/2014   Procedure:  COLONOSCOPY;  Surgeon: Malissa Hippo, MD;  [facility]: [facility];  Service: Endoscopy;  Laterality: N/A;  1030   COLONOSCOPY WITH PROPOFOL N/A 08/06/2021   Procedure: COLONOSCOPY WITH PROPOFOL;  Surgeon: Malissa Hippo, MD;  Location: [facility];  Service: Endoscopy;  Laterality: N/A;  1255   NASAL SINUS SURGERY     Family History  Problem Relation Age of Onset   Cancer Mother    Diabetes Sister    Hypertension Sister    Diabetes Sister    Cancer Daughter    Social History   Socioeconomic History   Marital status: Married    Spouse name: Not on file   Number of children: Not on file   Years of education: Not on file   Highest education level: Not on file  Occupational History   Not on file  Tobacco Use   Smoking status: Former    Passive exposure: Past   Smokeless tobacco: Never   Tobacco comments:    Quit 40 years ago   Vaping Use   Vaping status: Never Used  Substance and Sexual Activity   Alcohol use: No    Alcohol/week: 0.0 standard drinks of alcohol   Drug use: No   Sexual activity: Yes  Other Topics Concern   Not on file  Social History Narrative   Married for 45 years.Lives with wife.Retired.   Social Drivers of Corporate investment banker Strain: Low Risk  (06/09/2023)   Overall Financial Resource Strain (CARDIA)    Difficulty of Paying Living Expenses: Not hard at all  Food Insecurity: No Food Insecurity (06/09/2023)   Hunger Vital Sign    Worried About Running Out of Food in the Last Year: Never true    Ran Out of Food in the Last Year: Never true  Transportation Needs: No Transportation Needs (06/09/2023)   PRAPARE - Administrator, Civil Service (Medical): No    Lack of Transportation (Non-Medical): No  Physical Activity: Sufficiently Active (06/09/2023)   Exercise Vital Sign    Days of Exercise per Week: 7 days    Minutes of Exercise per Session: 30 min  Stress: No Stress Concern Present (06/09/2023)   [employer] of  Occupational Health - Occupational Stress Questionnaire    Feeling of Stress : Not at all  Social Connections: Socially Integrated (06/09/2023)   Social Connection and Isolation Panel [NHANES]    Frequency of Communication with Friends and Family: More than three times a week    Frequency of Social Gatherings with Friends and Family: More than three times a week    Attends Religious Services: More than 4 times per year    Active Member of [employer] or Organizations: Yes    Attends Engineer, structural: More than 4 times per year    Marital Status: Married    Tobacco Counseling Counseling given: Yes Tobacco comments: Quit 40 years ago    Clinical Intake:  Pre-visit preparation completed: Yes  Pain : No/denies pain     BMI - recorded: 38 Nutritional Status: BMI > 30  Obese Nutritional Risks: None Diabetes: Yes (telehealth visit. cbg documented below is per patient) CBG done?: No (per patient CBG fasting this morning was 128) Did pt. bring in CBG monitor from home?: No  How often do you need to have someone help you when you read instructions, pamphlets, or other written materials from your doctor or pharmacy?: 1 - Never  Interpreter Needed?: No  Information entered by :: Maryjean Ka CMA   Activities of Daily Living    06/09/2023    9:23 AM  In your present state of health, do you have any difficulty performing the following activities:  Hearing? 0  Vision? 0  Difficulty concentrating or making decisions? 0  Walking or climbing stairs? 0  Dressing or bathing? 0  Doing errands, shopping? 0  Preparing Food and eating ? N  Using the Toilet? N  In the past six months, have you accidently leaked urine? N  Do you have problems with loss of bowel control? N  Managing your Medications? N  Managing your Finances? N  Housekeeping or managing your Housekeeping? N    Patient Care Team: Billie Lade, MD as PCP - General (Internal Medicine) Erroll Luna, [facility]  (Inactive) as Pharmacist (Pharmacist) [place], [facility], [place], [place] (Optometry)  Indicate any recent Medical Services you may have received from other than [facility] providers in the past year (date may be approximate).     Assessment:   This is a routine wellness examination for [name] [name].  Hearing/Vision screen Hearing Screening - Comments:: Patient denies any hearing difficulties.   Vision Screening - Comments:: Wears rx glasses - up to date with routine eye exams  Patient sees Dr. Daisy Lazar w/ [facility] office.     Goals Addressed             This Visit's Progress    Patient Stated       Remain healthy and lose weight       Depression Screen    06/09/2023    9:25 AM 01/22/2023    8:05 AM 09/15/2022    8:26 AM 05/11/2022    8:53 AM 05/11/2022    8:15 AM 01/06/2022    8:20 AM 09/03/2021    8:34 AM  PHQ 2/9 Scores  PHQ - 2 Score 0 0 0 0 0 0 0  PHQ- 9 Score 0  0  0      Fall Risk    06/09/2023    9:25 AM 01/22/2023    8:05 AM 09/15/2022    8:26 AM 05/11/2022    8:53 AM 05/11/2022    8:15 AM  Fall Risk   Falls in the past year? 0 0 0 0 0  Number falls in past yr: 0 0 0 0 0  Injury with Fall? 0 0 0 0 0  Risk for fall due to : No Fall Risks  No Fall Risks  No Fall Risks  Follow up Falls prevention discussed;Falls evaluation completed  Falls evaluation completed  Falls evaluation completed    MEDICARE RISK AT HOME: Medicare Risk at Home Any stairs in or around the home?: Yes If so, are there any without handrails?: No Home free of loose throw rugs in walkways, pet beds, electrical cords, etc?: Yes Adequate lighting in your home to reduce risk of falls?: Yes Life alert?: No Use of a cane, walker or w/c?: No Grab bars in the bathroom?: Yes Shower chair or bench in shower?: Yes  Elevated toilet seat or a handicapped toilet?: Yes  TIMED UP AND GO:  Was the test performed?  No    Cognitive Function:        06/09/2023     9:25 AM 05/11/2022    8:53 AM  6CIT Screen  What Year? 0 points 0 points  What month? 0 points 0 points  What time? 0 points 0 points  Count back from 20 0 points 0 points  Months in reverse 0 points 4 points  Repeat phrase 0 points 10 points  Total Score 0 points 14 points    Immunizations Immunization History  Administered Date(s) Administered   Moderna Covid-19 Vaccine Bivalent Booster 76yrs & up 06/26/2021   Moderna SARS-COV2 Booster Vaccination 12/19/2019   Moderna Sars-Covid-2 Vaccination 11/23/2019, 11/23/2019, 12/19/2019, 05/28/2020, 05/28/2020    TDAP status: Due, Education has been provided regarding the importance of this vaccine. Advised may receive this vaccine at local pharmacy or Health Dept. Aware to provide a copy of the vaccination record if obtained from local pharmacy or Health Dept. Verbalized acceptance and understanding.  Flu Vaccine status: Declined, Education has been provided regarding the importance of this vaccine but patient still declined. Advised may receive this vaccine at local pharmacy or Health Dept. Aware to provide a copy of the vaccination record if obtained from local pharmacy or Health Dept. Verbalized acceptance and understanding.  Pneumococcal vaccine status: Declined,  Education has been provided regarding the importance of this vaccine but patient still declined. Advised may receive this vaccine at local pharmacy or Health Dept. Aware to provide a copy of the vaccination record if obtained from local pharmacy or Health Dept. Verbalized acceptance and understanding.   Covid-19 vaccine status: Declined, Education has been provided regarding the importance of this vaccine but patient still declined. Advised may receive this vaccine at local pharmacy or Health Dept.or vaccine clinic. Aware to provide a copy of the vaccination record if obtained from local pharmacy or Health Dept. Verbalized acceptance and understanding.  Qualifies for Shingles  Vaccine? Yes   Zostavax completed No   Shingrix Completed?: No.    Education has been provided regarding the importance of this vaccine. Patient has been advised to call insurance company to determine out of pocket expense if they have not yet received this vaccine. Advised may also receive vaccine at local pharmacy or Health Dept. Verbalized acceptance and understanding.  Screening Tests Health Maintenance  Topic Date Due   DTaP/Tdap/Td (1 - Tdap) Never done   COVID-19 Vaccine (8 - 2024-25 season) 12/27/2022   Medicare Annual Wellness (AWV)  05/12/2023   Pneumonia Vaccine 101+ Years old (1 of 2 - PCV) 07/21/2023 (Originally 10/24/1956)   INFLUENZA VACCINE  04/27/2028 (Originally 11/26/2022)   Hepatitis C Screening  04/27/2048 (Originally 10/24/1968)   HEMOGLOBIN A1C  07/22/2023   Diabetic kidney evaluation - Urine ACR  09/15/2023   FOOT EXAM  09/15/2023   OPHTHALMOLOGY EXAM  11/30/2023   Diabetic kidney evaluation - eGFR measurement  01/22/2024   Colonoscopy  08/06/2028   HPV VACCINES  Aged Out   Zoster Vaccines- Shingrix  Discontinued    Health Maintenance  Health Maintenance Due  Topic Date Due   DTaP/Tdap/Td (1 - Tdap) Never done   COVID-19 Vaccine (8 - 2024-25 season) 12/27/2022   Medicare Annual Wellness (AWV)  05/12/2023    Colorectal cancer screening: Type of screening: Colonoscopy. Completed 08/06/2021. Repeat every 7 years  Lung Cancer Screening: (Low Dose CT Chest recommended if Age 37-80 years,  20 pack-year currently smoking OR have quit w/in 15years.) does not qualify.   Lung Cancer Screening Referral: na  Additional Screening:  Hepatitis C Screening: does qualify; Patient declined  Vision Screening: Recommended annual ophthalmology exams for early detection of glaucoma and other disorders of the eye. Is the patient up to date with their annual eye exam?  Yes  Who is the provider or what is the name of the office in which the patient attends annual eye exams?  Patient sees Dr. Daisy Lazar w/ [facility] office.   Dental Screening: Recommended annual dental exams for proper oral hygiene  Diabetic Foot Exam: Diabetic Foot Exam: Completed 09/15/2022  Community Resource Referral / Chronic Care Management: CRR required this visit?  No   CCM required this visit?  No     Plan:     I have personally reviewed and noted the following in the patient's chart:   Medical and social history Use of alcohol, tobacco or illicit drugs  Current medications and supplements including opioid prescriptions. Patient is not currently taking opioid prescriptions. Functional ability and status Nutritional status Physical activity Advanced directives List of other physicians Hospitalizations, surgeries, and ER visits in previous 12 months Vitals Screenings to include cognitive, depression, and falls Referrals and appointments  In addition, I have reviewed and discussed with patient certain preventive protocols, quality metrics, and best practice recommendations. A written personalized care plan for preventive services as well as general preventive health recommendations were provided to patient.     Jordan Hawks Jaquae Rieves, CMA   06/09/2023   After Visit Summary: (Mail) Due to this being a telephonic visit, the after visit summary with patients personalized plan was offered to patient via mail   Nurse Notes: na

## 2023-06-23 ENCOUNTER — Telehealth: Payer: Self-pay

## 2023-06-23 NOTE — Telephone Encounter (Signed)
 Patient was identified as falling into the [place] Measure - Diabetes.   Patient was: Appointment scheduled with primary care provider in the next 30 days.

## 2023-07-01 ENCOUNTER — Other Ambulatory Visit: Payer: Self-pay | Admitting: Internal Medicine

## 2023-07-01 MED ORDER — GLIPIZIDE ER 10 MG PO TB24: 10.0000 mg | ORAL_TABLET | Freq: Every day | ORAL | 3 refills | Status: DC

## 2023-07-01 NOTE — Telephone Encounter (Signed)
 Copied from CRM [id number]. Topic: Clinical - Medication Refill >> Jul 01, 2023  8:38 AM Tiffany B wrote: Most Recent Primary Care Visit:  Provider: Recardo Evangelist A  Department: [facility]  Visit Type: ANNUAL WELL VISIT, SEQUENTIAL  Date: 06/09/2023  Medication: 90 day supply glipiZIDE (GLUCOTROL XL) 10 MG 24 hr tablet  Has the patient contacted their pharmacy? Yes   Is this the correct pharmacy for this prescription? Yes  This is the patient's preferred pharmacy:  [id number] - [place], [place] - [address] AT [facility]  Phone: [phone] Fax: [phone]    Has the prescription been filled recently? Yes  Is the patient out of the medication? No, patient has 6 pills  Has the patient been seen for an appointment in the last year OR does the patient have an upcoming appointment? Yes  Can we respond through MyChart? No  Agent: Please be advised that Rx refills may take up to 3 business days. We ask that you follow-up with your pharmacy.

## 2023-07-04 DIAGNOSIS — R059 Cough, unspecified: Secondary | ICD-10-CM | POA: Diagnosis not present

## 2023-07-04 DIAGNOSIS — J22 Unspecified acute lower respiratory infection: Secondary | ICD-10-CM | POA: Diagnosis not present

## 2023-07-04 DIAGNOSIS — Z6836 Body mass index (BMI) 36.0-36.9, adult: Secondary | ICD-10-CM | POA: Diagnosis not present

## 2023-07-04 DIAGNOSIS — E669 Obesity, unspecified: Secondary | ICD-10-CM | POA: Diagnosis not present

## 2023-07-04 DIAGNOSIS — R030 Elevated blood-pressure reading, without diagnosis of hypertension: Secondary | ICD-10-CM | POA: Diagnosis not present

## 2023-07-22 ENCOUNTER — Encounter: Payer: Self-pay | Admitting: Internal Medicine

## 2023-07-22 ENCOUNTER — Ambulatory Visit: Payer: Medicare Other | Admitting: Internal Medicine

## 2023-07-22 VITALS — BP 130/72 | HR 81 | Ht 73.0 in | Wt 284.0 lb

## 2023-07-22 DIAGNOSIS — E782 Mixed hyperlipidemia: Secondary | ICD-10-CM | POA: Diagnosis not present

## 2023-07-22 DIAGNOSIS — I10 Essential (primary) hypertension: Secondary | ICD-10-CM

## 2023-07-22 DIAGNOSIS — Z7984 Long term (current) use of oral hypoglycemic drugs: Secondary | ICD-10-CM

## 2023-07-22 DIAGNOSIS — E559 Vitamin D deficiency, unspecified: Secondary | ICD-10-CM

## 2023-07-22 DIAGNOSIS — N1831 Chronic kidney disease, stage 3a: Secondary | ICD-10-CM

## 2023-07-22 DIAGNOSIS — I129 Hypertensive chronic kidney disease with stage 1 through stage 4 chronic kidney disease, or unspecified chronic kidney disease: Secondary | ICD-10-CM

## 2023-07-22 DIAGNOSIS — N182 Chronic kidney disease, stage 2 (mild): Secondary | ICD-10-CM

## 2023-07-22 DIAGNOSIS — E1122 Type 2 diabetes mellitus with diabetic chronic kidney disease: Secondary | ICD-10-CM | POA: Diagnosis not present

## 2023-07-22 NOTE — Patient Instructions (Signed)
 It was a pleasure to see you today.  Thank you for giving [place] the opportunity to be involved in your care.  Below is a brief recap of your visit and next steps.  We will plan to see you again in 6 months.  Summary No medication changes today Repeat labs ordered Follow up in 6 months

## 2023-07-22 NOTE — Assessment & Plan Note (Signed)
 A1c 8.4 on labs from September 2024.  He is currently prescribed glipizide 10 mg daily and Janumet XR [phone] mg daily.  We have previously discussed GLP-1 options but he has declined.  He feels well and has reservations about starting any new medication due to potential adverse side effects.  He has focused on dietary changes in an effort to improve glycemic control.  Home blood sugar readings range 100-130. -Repeat A1c ordered today

## 2023-07-22 NOTE — Assessment & Plan Note (Signed)
 Remains adequately controlled with lisinopril-HCTZ 20-25 mg daily.

## 2023-07-22 NOTE — Assessment & Plan Note (Signed)
 Stable renal function noted on labs from September 2024.  Currently prescribed lisinopril-HCTZ.  Repeat labs ordered today.

## 2023-07-22 NOTE — Assessment & Plan Note (Signed)
 Lipid panel updated in September 2024.  Total cholesterol 224 and LDL 149.  His 10-year ASCVD risk is 44.7%.  He has a documented history of statin intolerance.  We have previously discussed PCSK9 inhibitor options but he has declined.  He prefers to focus on dietary changes aimed at lowering his cholesterol. -Repeat lipid panel ordered today

## 2023-07-22 NOTE — Progress Notes (Signed)
 Established Patient Office Visit  Subjective   Patient ID: [name] [name], male    DOB: 1950-06-02  Age: 73 y.o. MRN: [id number]  Chief Complaint  Patient presents with   Care Management    Six month follow up    Mr. Jubb returns to care today for routine follow-up.  He was last evaluated by me in September 2024.  No medication changes were made at that time and 54-month follow-up was arranged.  Today he reports feeling well.  He is asymptomatic and has no acute concerns to discuss.  Past Medical History:  Diagnosis Date   Diabetes mellitus    Hyperlipidemia    Hypertension    Renal cyst 04/28/2011   Left hemorrhagic hilar cyst   Past Surgical History:  Procedure Laterality Date   COLONOSCOPY N/A 06/28/2014   Procedure: COLONOSCOPY;  Surgeon: Malissa Hippo, MD;  Location: [facility];  Service: Endoscopy;  Laterality: N/A;  1030   COLONOSCOPY WITH PROPOFOL N/A 08/06/2021   Procedure: COLONOSCOPY WITH PROPOFOL;  Surgeon: Malissa Hippo, MD;  Location: [facility];  Service: Endoscopy;  Laterality: N/A;  1255   NASAL SINUS SURGERY     Social History   Tobacco Use   Smoking status: Former    Passive exposure: Past   Smokeless tobacco: Never   Tobacco comments:    Quit 40 years ago   Vaping Use   Vaping status: Never Used  Substance Use Topics   Alcohol use: No    Alcohol/week: 0.0 standard drinks of alcohol   Drug use: No   Family History  Problem Relation Age of Onset   Cancer Mother    Diabetes Sister    Hypertension Sister    Diabetes Sister    Cancer Daughter    Allergies  Allergen Reactions   Aspirin     Nose Bleeds    Lipitor [Atorvastatin] Other (See Comments)    Nose bleeds   Codeine Palpitations    'heart beats real fast'   Review of Systems  Constitutional:  Negative for chills and fever.  HENT:  Negative for sore throat.   Respiratory:  Positive for cough. Negative for shortness of breath.   Cardiovascular:  Negative for chest pain,  palpitations and leg swelling.  Gastrointestinal:  Negative for abdominal pain, blood in stool, constipation, diarrhea, nausea and vomiting.  Genitourinary:  Negative for dysuria and hematuria.  Musculoskeletal:  Negative for myalgias.  Skin:  Negative for itching and rash.  Neurological:  Negative for dizziness and headaches.  Psychiatric/Behavioral:  Negative for depression and suicidal ideas.      Objective:     BP 130/72   Pulse 81   Ht 6\' 1"  (1.854 m)   Wt 284 lb (128.8 kg)   SpO2 94%   BMI 37.47 kg/m  BP Readings from Last 3 Encounters:  07/22/23 130/72  06/09/23 131/75  01/22/23 130/81   Physical Exam Vitals reviewed.  Constitutional:      General: He is not in acute distress.    Appearance: Normal appearance. He is obese. He is not ill-appearing.  HENT:     Head: Normocephalic and atraumatic.     Right Ear: External ear normal.     Left Ear: External ear normal.     Nose: Nose normal. No congestion or rhinorrhea.     Mouth/Throat:     Mouth: Mucous membranes are moist.     Pharynx: Oropharynx is clear.  Eyes:     General:  No scleral icterus.    Extraocular Movements: Extraocular movements intact.     Conjunctiva/sclera: Conjunctivae normal.     Pupils: Pupils are equal, round, and reactive to light.  Cardiovascular:     Rate and Rhythm: Normal rate and regular rhythm.     Pulses: Normal pulses.     Heart sounds: Normal heart sounds. No murmur heard. Pulmonary:     Effort: Pulmonary effort is normal.     Breath sounds: Normal breath sounds. No wheezing, rhonchi or rales.  Abdominal:     General: Abdomen is flat. Bowel sounds are normal. There is no distension.     Palpations: Abdomen is soft.     Tenderness: There is no abdominal tenderness.  Musculoskeletal:        General: No swelling or deformity. Normal range of motion.     Cervical back: Normal range of motion.  Skin:    General: Skin is warm and dry.     Capillary Refill: Capillary refill takes  less than 2 seconds.  Neurological:     General: No focal deficit present.     Mental Status: He is alert and oriented to person, place, and time.     Motor: No weakness.  Psychiatric:        Mood and Affect: Mood normal.        Behavior: Behavior normal.        Thought Content: Thought content normal.   Last CBC Lab Results  Component Value Date   WBC 4.8 01/22/2023   HGB 13.9 01/22/2023   HCT 42.3 01/22/2023   MCV 88 01/22/2023   MCH 29.0 01/22/2023   RDW 14.1 01/22/2023   PLT 209 01/22/2023   Last metabolic panel Lab Results  Component Value Date   GLUCOSE 206 (H) 01/22/2023   NA 138 01/22/2023   K 4.4 01/22/2023   CL 98 01/22/2023   CO2 23 01/22/2023   BUN 12 01/22/2023   CREATININE 1.31 (H) 01/22/2023   EGFR 57 (L) 01/22/2023   CALCIUM 9.6 01/22/2023   PROT 6.8 01/22/2023   ALBUMIN 4.4 01/22/2023   LABGLOB 2.4 01/22/2023   AGRATIO 1.8 01/02/2022   BILITOT 0.6 01/22/2023   ALKPHOS 61 01/22/2023   AST 20 01/22/2023   ALT 21 01/22/2023   ANIONGAP 13 07/31/2021   Last lipids Lab Results  Component Value Date   CHOL 224 (H) 01/22/2023   HDL 46 01/22/2023   LDLCALC 149 (H) 01/22/2023   LDLDIRECT 94 06/09/2011   TRIG 161 (H) 01/22/2023   CHOLHDL 4.9 01/22/2023   Last hemoglobin A1c Lab Results  Component Value Date   HGBA1C 8.4 (H) 01/22/2023   Last thyroid functions Lab Results  Component Value Date   TSH 1.330 01/22/2023   Last vitamin D Lab Results  Component Value Date   VD25OH 21.5 (L) 01/22/2023   Last vitamin B12 and Folate Lab Results  Component Value Date   VITAMINB12 >2000 (H) 01/22/2023   FOLATE 17.4 01/22/2023   The 10-year ASCVD risk score (Arnett DK, et al., 2019) is: 38.5%    Assessment & Plan:   Problem List Items Addressed This Visit       Type 2 DM with CKD stage 2 and hypertension (HCC) - Primary   A1c 8.4 on labs from September 2024.  He is currently prescribed glipizide 10 mg daily and Janumet XR [phone] mg daily.   We have previously discussed GLP-1 options but he has declined.  He feels well and  has reservations about starting any new medication due to potential adverse side effects.  He has focused on dietary changes in an effort to improve glycemic control.  Home blood sugar readings range 100-130. -Repeat A1c ordered today      HYPERTENSION, BENIGN   Remains adequately controlled with lisinopril-HCTZ 20-25 mg daily.      CKD (chronic kidney disease), stage III (HCC)   Stable renal function noted on labs from September 2024.  Currently prescribed lisinopril-HCTZ.  Repeat labs ordered today.      Hyperlipidemia   Lipid panel updated in September 2024.  Total cholesterol 224 and LDL 149.  His 10-year ASCVD risk is 44.7%.  He has a documented history of statin intolerance.  We have previously discussed PCSK9 inhibitor options but he has declined.  He prefers to focus on dietary changes aimed at lowering his cholesterol. -Repeat lipid panel ordered today      Return in about 6 months (around 01/22/2024).   Billie Lade, MD

## 2023-07-23 ENCOUNTER — Encounter: Payer: Self-pay | Admitting: Internal Medicine

## 2023-07-23 LAB — CMP14+EGFR
ALT: 15 IU/L (ref 0–44)
AST: 16 IU/L (ref 0–40)
Albumin: 4.3 g/dL (ref 3.8–4.8)
Alkaline Phosphatase: 59 IU/L (ref 44–121)
BUN/Creatinine Ratio: 13 (ref 10–24)
BUN: 16 mg/dL (ref 8–27)
Bilirubin Total: 0.4 mg/dL (ref 0.0–1.2)
CO2: 25 mmol/L (ref 20–29)
Calcium: 9.7 mg/dL (ref 8.6–10.2)
Chloride: 99 mmol/L (ref 96–106)
Creatinine, Ser: 1.26 mg/dL (ref 0.76–1.27)
Globulin, Total: 2.6 g/dL (ref 1.5–4.5)
Glucose: 189 mg/dL — ABNORMAL HIGH (ref 70–99)
Potassium: 4.5 mmol/L (ref 3.5–5.2)
Sodium: 140 mmol/L (ref 134–144)
Total Protein: 6.9 g/dL (ref 6.0–8.5)
eGFR: 60 mL/min/{1.73_m2} (ref >59)

## 2023-07-23 LAB — LIPID PANEL
Chol/HDL Ratio: 4.1 ratio (ref 0.0–5.0)
Cholesterol, Total: 211 mg/dL — ABNORMAL HIGH (ref 100–199)
HDL: 52 mg/dL (ref >39)
LDL Chol Calc (NIH): 138 mg/dL — ABNORMAL HIGH (ref 0–99)
Triglycerides: 120 mg/dL (ref 0–149)
VLDL Cholesterol Cal: 21 mg/dL (ref 5–40)

## 2023-07-23 LAB — CBC WITH DIFFERENTIAL/PLATELET
Basophils Absolute: 0 10*3/uL (ref 0.0–0.2)
Basos: 1 %
EOS (ABSOLUTE): 0.3 10*3/uL (ref 0.0–0.4)
Eos: 5 %
Hematocrit: 40.9 % (ref 37.5–51.0)
Hemoglobin: 13.7 g/dL (ref 13.0–17.7)
Immature Grans (Abs): 0 10*3/uL (ref 0.0–0.1)
Immature Granulocytes: 0 %
Lymphocytes Absolute: 2.1 10*3/uL (ref 0.7–3.1)
Lymphs: 38 %
MCH: 29 pg (ref 26.6–33.0)
MCHC: 33.5 g/dL (ref 31.5–35.7)
MCV: 87 fL (ref 79–97)
Monocytes Absolute: 0.5 10*3/uL (ref 0.1–0.9)
Monocytes: 10 %
Neutrophils Absolute: 2.5 10*3/uL (ref 1.4–7.0)
Neutrophils: 46 %
Platelets: 237 10*3/uL (ref 150–450)
RBC: 4.72 x10E6/uL (ref 4.14–5.80)
RDW: 13.8 % (ref 11.6–15.4)
WBC: 5.4 10*3/uL (ref 3.4–10.8)

## 2023-07-23 LAB — B12 AND FOLATE PANEL
Folate: >20 ng/mL (ref >3.0)
Vitamin B-12: 1899 pg/mL — ABNORMAL HIGH (ref 232–1245)

## 2023-07-23 LAB — TSH+FREE T4
Free T4: 0.97 ng/dL (ref 0.82–1.77)
TSH: 1.06 u[IU]/mL (ref 0.450–4.500)

## 2023-07-23 LAB — HEMOGLOBIN A1C
Est. average glucose Bld gHb Est-mCnc: 186 mg/dL
Hgb A1c MFr Bld: 8.1 % — ABNORMAL HIGH (ref 4.8–5.6)

## 2023-07-23 LAB — VITAMIN D 25 HYDROXY (VIT D DEFICIENCY, FRACTURES): Vit D, 25-Hydroxy: 61.2 ng/mL (ref 30.0–100.0)

## 2023-09-13 ENCOUNTER — Other Ambulatory Visit: Payer: Self-pay | Admitting: Internal Medicine

## 2023-09-13 DIAGNOSIS — E1122 Type 2 diabetes mellitus with diabetic chronic kidney disease: Secondary | ICD-10-CM

## 2023-09-28 ENCOUNTER — Other Ambulatory Visit: Payer: Self-pay

## 2023-09-28 DIAGNOSIS — E1122 Type 2 diabetes mellitus with diabetic chronic kidney disease: Secondary | ICD-10-CM

## 2023-09-28 DIAGNOSIS — I10 Essential (primary) hypertension: Secondary | ICD-10-CM

## 2023-09-28 MED ORDER — GLIPIZIDE ER 10 MG PO TB24: 10.0000 mg | ORAL_TABLET | Freq: Every day | ORAL | 3 refills | Status: AC

## 2023-09-28 MED ORDER — JANUMET XR 100-1000 MG PO TB24: 1.0000 | ORAL_TABLET | Freq: Every day | ORAL | 3 refills | Status: AC

## 2023-09-28 MED ORDER — LISINOPRIL-HYDROCHLOROTHIAZIDE 20-25 MG PO TABS: 1.0000 | ORAL_TABLET | Freq: Every day | ORAL | 3 refills | Status: AC

## 2023-11-30 DIAGNOSIS — E113293 Type 2 diabetes mellitus with mild nonproliferative diabetic retinopathy without macular edema, bilateral: Secondary | ICD-10-CM | POA: Diagnosis not present

## 2024-01-21 ENCOUNTER — Ambulatory Visit

## 2024-01-21 VITALS — BP 130/84 | HR 81 | Ht 69.0 in | Wt 293.1 lb

## 2024-01-21 DIAGNOSIS — N1831 Chronic kidney disease, stage 3a: Secondary | ICD-10-CM

## 2024-01-21 DIAGNOSIS — E559 Vitamin D deficiency, unspecified: Secondary | ICD-10-CM | POA: Diagnosis not present

## 2024-01-21 DIAGNOSIS — I129 Hypertensive chronic kidney disease with stage 1 through stage 4 chronic kidney disease, or unspecified chronic kidney disease: Secondary | ICD-10-CM | POA: Diagnosis not present

## 2024-01-21 DIAGNOSIS — Z7984 Long term (current) use of oral hypoglycemic drugs: Secondary | ICD-10-CM

## 2024-01-21 DIAGNOSIS — E1122 Type 2 diabetes mellitus with diabetic chronic kidney disease: Secondary | ICD-10-CM

## 2024-01-21 DIAGNOSIS — E782 Mixed hyperlipidemia: Secondary | ICD-10-CM | POA: Diagnosis not present

## 2024-01-21 DIAGNOSIS — I10 Essential (primary) hypertension: Secondary | ICD-10-CM

## 2024-01-21 DIAGNOSIS — N182 Chronic kidney disease, stage 2 (mild): Secondary | ICD-10-CM | POA: Diagnosis not present

## 2024-01-21 NOTE — Assessment & Plan Note (Signed)
 Remains adequately controlled with lisinopril-HCTZ 20-25 mg daily.

## 2024-01-21 NOTE — Assessment & Plan Note (Signed)
 A1c 8.1 on labs from March 2025.  He is currently prescribed glipizide  10 mg daily and Janumet  XR [phone] mg daily.  He has focused on dietary changes in an effort to improve glycemic control.  Home blood sugar readings range 100-150. -Repeat A1c ordered today  Diabetic foot exam was performed.  No deformities or other abnormal visual findings.  Posterior tibialis and dorsalis pulse intact bilaterally.  Intact to touch and monofilament testing bilaterally.

## 2024-01-21 NOTE — Assessment & Plan Note (Addendum)
 Stable renal function noted on labs from March 2025.  Currently prescribed lisinopril -HCTZ.  Repeat labs ordered today.

## 2024-01-21 NOTE — Progress Notes (Signed)
 Established Patient Office Visit  Subjective   Patient ID: [name] [name], male    DOB: 10-07-1950  Age: 73 y.o. MRN: [id number]  Chief Complaint  Patient presents with   Medical Management of Chronic Issues    Follow up    HPI Discussed the use of AI scribe software for clinical note transcription with the patient, who gave verbal consent to proceed.  History of Present Illness    [name] [name] is a 73 year old male with diabetes who presents for a routine follow-up visit.  Glycemic control - Fatigue present - Elevated blood glucose levels, particularly in the morning - Occasional nocturnal hypoglycemia requiring nighttime eating - Has a home blood glucose monitor but does not bring it to appointments and may not be checking as frequently as needed - Currently fasting  Diabetes-related complications - No foot issues - No pain in feet - Up to date with eye exams, most recent in August  Antihyperglycemic medication management - Currently taking Janumet  and glipizide . - Medication managed through [facility]     Patient Active Problem List   Diagnosis Date Noted   Drug-induced myopathy 09/18/2022   Refused influenza vaccine 01/06/2022   Vitamin D  deficiency 01/06/2022   Annual physical exam 01/06/2022   CKD (chronic kidney disease), stage III (HCC) 11/14/2018   Myalgia due to HMG CoA reductase inhibitor 08/02/2017   Bell's palsy 11/13/2014   Pain in joint, shoulder region 07/16/2014   Boil of buttock 04/07/2013   Hemorrhagic cyst 06/10/2011   Hematuria 06/09/2011   Type 2 DM with CKD stage 2 and hypertension (HCC) 03/14/2009   Hyperlipidemia 03/14/2009   Morbid obesity (HCC) 03/14/2009   HYPERTENSION, BENIGN 03/14/2009    ROS    Objective:     BP 130/84   Pulse 81   Ht 5' 9 (1.753 m)   Wt 293 lb 1.3 oz (132.9 kg)   SpO2 97%   BMI 43.28 kg/m  BP Readings from Last 3 Encounters:  01/21/24 130/84  07/22/23 130/72  06/09/23 131/75   Wt Readings from  Last 3 Encounters:  01/21/24 293 lb 1.3 oz (132.9 kg)  07/22/23 284 lb (128.8 kg)  06/09/23 288 lb (130.6 kg)      Physical Exam Vitals and nursing note reviewed.  Constitutional:      Appearance: Normal appearance. He is obese.  HENT:     Head: Normocephalic.     Right Ear: Tympanic membrane, ear canal and external ear normal.     Left Ear: Tympanic membrane, ear canal and external ear normal.     Nose: Nose normal.     Mouth/Throat:     Mouth: Mucous membranes are moist.     Pharynx: Oropharynx is clear.  Cardiovascular:     Rate and Rhythm: Normal rate and regular rhythm.     Pulses:          Dorsalis pedis pulses are 1+ on the right side and 1+ on the left side.       Posterior tibial pulses are 1+ on the right side and 1+ on the left side.  Pulmonary:     Effort: Pulmonary effort is normal.     Breath sounds: Normal breath sounds.  Musculoskeletal:     Cervical back: Normal range of motion and neck supple.     Right foot: No deformity or bunion.     Left foot: No deformity or bunion.  Feet:     Right foot:  Protective Sensation: 6 sites tested.  6 sites sensed.     Skin integrity: Skin integrity normal.     Toenail Condition: Right toenails are normal.     Left foot:     Protective Sensation: 6 sites tested.  6 sites sensed.     Skin integrity: Skin integrity normal.     Toenail Condition: Left toenails are normal.  Skin:    General: Skin is warm and dry.  Neurological:     Mental Status: He is alert and oriented to person, place, and time.  Psychiatric:        Mood and Affect: Mood normal.        Thought Content: Thought content normal.     No results found for any visits on 01/21/24.    The 10-year ASCVD risk score (Arnett DK, et al., 2019) is: 37.9%    Assessment & Plan:   Problem List Items Addressed This Visit       Cardiovascular and Mediastinum   Type 2 DM with CKD stage 2 and hypertension (HCC) - Primary   A1c 8.1 on labs from March  2025.  He is currently prescribed glipizide  10 mg daily and Janumet  XR [phone] mg daily.  He has focused on dietary changes in an effort to improve glycemic control.  Home blood sugar readings range 100-150. -Repeat A1c ordered today  Diabetic foot exam was performed.  No deformities or other abnormal visual findings.  Posterior tibialis and dorsalis pulse intact bilaterally.  Intact to touch and monofilament testing bilaterally.      Relevant Orders   CMP14+EGFR   Hemoglobin A1c   Microalbumin / creatinine urine ratio   HYPERTENSION, BENIGN   Remains adequately controlled with lisinopril -HCTZ 20-25 mg daily.      Relevant Orders   CMP14+EGFR     Genitourinary   CKD (chronic kidney disease), stage III (HCC)   Stable renal function noted on labs from March 2025.  Currently prescribed lisinopril -HCTZ.  Repeat labs ordered today.      Relevant Orders   CMP14+EGFR   Microalbumin / creatinine urine ratio     Other   Hyperlipidemia   Relevant Orders   CMP14+EGFR   Lipid panel   Vitamin D  deficiency   Relevant Orders   Vitamin D  (25 hydroxy)    Return in about 6 months (around 07/20/2024) for chronic follow-up with PCP.    Leita Longs, FNP

## 2024-01-23 LAB — CMP14+EGFR
ALT: 19 IU/L (ref 0–44)
AST: 16 IU/L (ref 0–40)
Albumin: 4.5 g/dL (ref 3.8–4.8)
Alkaline Phosphatase: 57 IU/L (ref 47–123)
BUN/Creatinine Ratio: 12 (ref 10–24)
BUN: 14 mg/dL (ref 8–27)
Bilirubin Total: 0.6 mg/dL (ref 0.0–1.2)
CO2: 23 mmol/L (ref 20–29)
Calcium: 10.2 mg/dL (ref 8.6–10.2)
Chloride: 98 mmol/L (ref 96–106)
Creatinine, Ser: 1.2 mg/dL (ref 0.76–1.27)
Globulin, Total: 2.6 g/dL (ref 1.5–4.5)
Glucose: 187 mg/dL — ABNORMAL HIGH (ref 70–99)
Potassium: 3.7 mmol/L (ref 3.5–5.2)
Sodium: 140 mmol/L (ref 134–144)
Total Protein: 7.1 g/dL (ref 6.0–8.5)
eGFR: 63 mL/min/1.73 (ref >59)

## 2024-01-23 LAB — LIPID PANEL
Chol/HDL Ratio: 4.4 ratio (ref 0.0–5.0)
Cholesterol, Total: 224 mg/dL — ABNORMAL HIGH (ref 100–199)
HDL: 51 mg/dL (ref >39)
LDL Chol Calc (NIH): 140 mg/dL — ABNORMAL HIGH (ref 0–99)
Triglycerides: 187 mg/dL — ABNORMAL HIGH (ref 0–149)
VLDL Cholesterol Cal: 33 mg/dL (ref 5–40)

## 2024-01-23 LAB — MICROALBUMIN / CREATININE URINE RATIO
Creatinine, Urine: 68.5 mg/dL
Microalb/Creat Ratio: 5 mg/g{creat} (ref 0–29)
Microalbumin, Urine: 3.5 ug/mL

## 2024-01-23 LAB — VITAMIN D 25 HYDROXY (VIT D DEFICIENCY, FRACTURES): Vit D, 25-Hydroxy: 31.3 ng/mL (ref 30.0–100.0)

## 2024-01-23 LAB — HEMOGLOBIN A1C
Est. average glucose Bld gHb Est-mCnc: 174 mg/dL
Hgb A1c MFr Bld: 7.7 % — ABNORMAL HIGH (ref 4.8–5.6)

## 2024-02-21 ENCOUNTER — Encounter (HOSPITAL_COMMUNITY): Payer: Self-pay

## 2024-02-21 ENCOUNTER — Emergency Department (HOSPITAL_COMMUNITY)

## 2024-02-21 ENCOUNTER — Other Ambulatory Visit: Payer: Self-pay

## 2024-02-21 ENCOUNTER — Emergency Department (HOSPITAL_COMMUNITY)
Admission: EM | Admit: 2024-02-21 | Discharge: 2024-02-21 | Disposition: A | Discharge status: Home / Self Care | Attending: Emergency Medicine | Admitting: Emergency Medicine

## 2024-02-21 DIAGNOSIS — I10 Essential (primary) hypertension: Secondary | ICD-10-CM | POA: Insufficient documentation

## 2024-02-21 DIAGNOSIS — M545 Low back pain, unspecified: Secondary | ICD-10-CM | POA: Diagnosis present

## 2024-02-21 DIAGNOSIS — E119 Type 2 diabetes mellitus without complications: Secondary | ICD-10-CM | POA: Insufficient documentation

## 2024-02-21 DIAGNOSIS — Z7984 Long term (current) use of oral hypoglycemic drugs: Secondary | ICD-10-CM | POA: Insufficient documentation

## 2024-02-21 DIAGNOSIS — Z79899 Other long term (current) drug therapy: Secondary | ICD-10-CM | POA: Insufficient documentation

## 2024-02-21 DIAGNOSIS — Y9389 Activity, other specified: Secondary | ICD-10-CM | POA: Diagnosis not present

## 2024-02-21 DIAGNOSIS — M51369 Other intervertebral disc degeneration, lumbar region without mention of lumbar back pain or lower extremity pain: Secondary | ICD-10-CM | POA: Diagnosis not present

## 2024-02-21 DIAGNOSIS — Y9222 Religious institution as the place of occurrence of the external cause: Secondary | ICD-10-CM | POA: Diagnosis not present

## 2024-02-21 DIAGNOSIS — X509XXA Other and unspecified overexertion or strenuous movements or postures, initial encounter: Secondary | ICD-10-CM | POA: Insufficient documentation

## 2024-02-21 DIAGNOSIS — S39012A Strain of muscle, fascia and tendon of lower back, initial encounter: Secondary | ICD-10-CM | POA: Diagnosis not present

## 2024-02-21 DIAGNOSIS — M549 Dorsalgia, unspecified: Secondary | ICD-10-CM | POA: Diagnosis not present

## 2024-02-21 NOTE — Discharge Instructions (Addendum)
 You likely have a muscle strain.  Take Tylenol  1000 mg every 6 hours, use the heating pad and use the lidocaine  patch and spray that you have.  This will take time but it will resolve.  If you have any concerning symptoms such as inability to control your bowel or bladder, difficulty walking please return to the emergency room for evaluation.  Otherwise follow-up with your primary care doctor for reevaluation.

## 2024-02-21 NOTE — ED Triage Notes (Signed)
 Pt arrived via pOV c/o right lower back pain. Pt reports he was helping his church sealing some holes in the parking lot 4 days ago and feels like he may have pulled a muscle.

## 2024-02-21 NOTE — ED Provider Notes (Signed)
 [facility] AT [facility] Provider Note   CSN: [id number] Arrival date & time: 02/21/24  1306     Patient presents with: Back Pain   [name] [name] is a 73 y.o. male.   73 year old male who has a history of hypertension, diabetes presents today for concern of right-sided low back pain.  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  He states he got on his knees to help the crew.  He has not done this much exertion in some time.  He does not have any history of low back issue.  Denies any bowel or bladder incontinence, difficulty ambulating, or direct trauma to his back.  The history is provided by the patient and the spouse. No language interpreter was used.       Prior to Admission medications   Medication Sig Start Date End Date Taking? Authorizing Provider  Calcium  Carb-Cholecalciferol (CALCIUM  500/D PO) Take 1 tablet by mouth in the morning.    [provider]  Cholecalciferol (VITAMIN D3) 1000 units CAPS Take 1,000 Units by mouth in the morning.    [provider]  Cinnamon 500 MG capsule Take 500 mg by mouth in the morning.    [provider]  Ginkgo Biloba 120 MG CAPS Take 1 capsule by mouth daily.    [provider]  glipiZIDE  (GLUCOTROL  XL) 10 MG 24 hr tablet Take 1 tablet (10 mg total) by mouth daily with breakfast. 09/28/23   Bevely Doffing, FNP  lisinopril -hydrochlorothiazide  (ZESTORETIC ) 20-25 MG tablet Take 1 tablet by mouth daily. 09/28/23   Bevely Doffing, FNP  Multiple Vitamin (MULTIVITAMIN WITH MINERALS) TABS tablet Take 1 tablet by mouth daily.    [provider]  Omega-3 Fatty Acids (FISH OIL ) 1000 MG CAPS Take 1 capsule (1,000 mg total) by mouth daily. 05/22/19   Bari Theodoro FALCON, MD  SitaGLIPtin -MetFORMIN  HCl (JANUMET  XR) [phone] MG TB24 Take 1 tablet by mouth daily. 09/28/23   Bevely Doffing, FNP  TURMERIC PO Take 1,500 mg by mouth. Once a day     [provider]  vitamin B-12 (CYANOCOBALAMIN ) 1000 MCG tablet Take 1,000 mcg by mouth in the morning.    [provider]    Allergies: Aspirin, Lipitor [atorvastatin ], and Codeine    Review of Systems  Constitutional:  Negative for chills and fever.  Gastrointestinal:  Negative for abdominal pain.  Genitourinary:  Negative for difficulty urinating and dysuria.  Musculoskeletal:  Positive for back pain. Negative for arthralgias and gait problem.  All other systems reviewed and are negative.   Updated Vital Signs BP (!) 151/87 (BP Location: Right Arm)   Pulse 87   Temp 97.8 F (36.6 C) (Oral)   Resp 18   Ht 5' 9 (1.753 m)   Wt 132.9 kg   SpO2 98%   BMI 43.27 kg/m   Physical Exam Vitals and nursing note reviewed.  Constitutional:      General: He is not in acute distress.    Appearance: Normal appearance. He is not ill-appearing.  HENT:     Head: Normocephalic and atraumatic.     Nose: Nose normal.  Eyes:     Conjunctiva/sclera: Conjunctivae normal.  Cardiovascular:     Rate and Rhythm: Normal rate and regular rhythm.  Pulmonary:     Effort: Pulmonary effort is normal. No respiratory distress.  Abdominal:     Palpations: Abdomen is soft.  Tenderness: There is no abdominal tenderness. There is no guarding.  Musculoskeletal:        General: No deformity. Normal range of motion.     Comments: Good range of motion in bilateral lower extremities with 5/5 strength in extensor and flexor muscle groups.  Cervical, thoracic, lumbar spine without tenderness to palpation.  Spine well aligned and without any step-offs.  He does have tenderness to right lumbar paraspinal muscles.  Skin:    Findings: No rash.  Neurological:     Mental Status: He is alert.     (all labs ordered are listed, but only abnormal results are displayed) Labs Reviewed - No data to display  EKG: None  Radiology: DG Lumbar Spine 2-3 Views Result Date: 02/21/2024 CLINICAL DATA:   Back pain. EXAM: LUMBAR SPINE - 2-3 VIEW COMPARISON:  CT abdomen pelvis 05/16/2011 FINDINGS: No evidence of acute fracture or subluxation. Multilevel degenerative disc disease throughout the lumbar spine most significantly at L4-5 and L2-3. Facet hypertrophy at L4-5 and L5-S1. No bony lesions or destruction. IMPRESSION: Multilevel degenerative disc disease and facet hypertrophy in the lumbar spine. Electronically Signed   By: Marcey Moan M.D.   On: 02/21/2024 15:05     Procedures   Medications Ordered in the ED - No data to display                                  Medical Decision Making Amount and/or Complexity of Data Reviewed Radiology: ordered.    Medical Decision Making / ED Course   This patient presents to the ED for concern of back pain, this involves an extensive number of treatment options, and is a complaint that carries with it a high risk of complications and morbidity.  The differential diagnosis includes muscle strain, spine fracture, cauda equina syndrome, spinal epidural abscess  MDM: 73 year old male presents with right lower back pain.  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 His exam is reassuring.  No spinal tenderness.  Reproducible pain to the right lumbar paraspinal muscles.  No signs or symptoms of cauda equina syndrome or spinal epidural abscess.  Ambulates without difficulty.  He is well-appearing. He had a plain film of his lumbar spine.  Shows some degenerative change otherwise no acute finding.  We discussed how this has low utility in evaluating for spinal fractures.  Given his history no suspicion for spinal fracture.  However I did offer but he declines. Given his age muscle relaxer would not be a good choice.  We did have a shared decision making regarding muscle relaxer, or a single dose of Valium in the emergency department but patient and wife both  declined.  They are reassured of the exam and x-ray.  Supportive care discussed with Tylenol  1000 mg every 6 hours, but have lidocaine  patch and spray at home, and heating pad. They will follow-up with their PCP to ensure this resolved. Strict return precautions discussed. Patient and spouse voiced understanding and are in agreement with plan.    Additional history obtained: -Additional history obtained from wife at bedside -External records from outside source obtained and reviewed including: Chart review including previous notes, labs, imaging, consultation notes   Lab Tests: -I ordered, reviewed, and interpreted labs.   The pertinent results include:   Labs Reviewed - No data to display  EKG  EKG Interpretation Date/Time:    Ventricular Rate:    PR Interval:    QRS Duration:    QT Interval:    QTC Calculation:   R Axis:      Text Interpretation:           Imaging Studies ordered: I ordered imaging studies including lumbar spine x-ray I independently visualized and interpreted imaging. I agree with the radiologist interpretation   Medicines ordered and prescription drug management: No orders of the defined types were placed in this encounter.   -I have reviewed the patients home medicines and have made adjustments as needed    Reevaluation: After the interventions noted above, I reevaluated the patient and found that they have :improved  Co morbidities that complicate the patient evaluation  Past Medical History:  Diagnosis Date   Diabetes mellitus    Hyperlipidemia    Hypertension    Renal cyst 04/28/2011   Left hemorrhagic hilar cyst      Dispostion: Discharged in stable condition.  Final diagnoses:  Strain of lumbar region, initial encounter    ED Discharge Orders     None          Hildegard Loge, DEVONNA 02/21/24 1554    Dean Clarity, MD 02/21/24 1758

## 2024-04-28 ENCOUNTER — Ambulatory Visit: Payer: Self-pay

## 2024-06-12 ENCOUNTER — Ambulatory Visit: Payer: PPO

## 2024-07-20 ENCOUNTER — Ambulatory Visit

## 2024-09-01 ENCOUNTER — Ambulatory Visit
